# Patient Record
Sex: Male | Born: 1935
Health system: Southern US, Community
[De-identification: ages and names within clinical notes are randomized; demographics above are authoritative.]

## PROBLEM LIST (undated history)

## (undated) DIAGNOSIS — R011 Cardiac murmur, unspecified: Secondary | ICD-10-CM

## (undated) DIAGNOSIS — I2510 Atherosclerotic heart disease of native coronary artery without angina pectoris: Secondary | ICD-10-CM

## (undated) DIAGNOSIS — I10 Essential (primary) hypertension: Secondary | ICD-10-CM

## (undated) DIAGNOSIS — E7800 Pure hypercholesterolemia, unspecified: Secondary | ICD-10-CM

## (undated) DIAGNOSIS — I4891 Unspecified atrial fibrillation: Secondary | ICD-10-CM

## (undated) DIAGNOSIS — R39198 Other difficulties with micturition: Secondary | ICD-10-CM

## (undated) DIAGNOSIS — I480 Paroxysmal atrial fibrillation: Secondary | ICD-10-CM

## (undated) DIAGNOSIS — E039 Hypothyroidism, unspecified: Secondary | ICD-10-CM

## (undated) DIAGNOSIS — K5790 Diverticulosis of intestine, part unspecified, without perforation or abscess without bleeding: Secondary | ICD-10-CM

## (undated) DIAGNOSIS — G4700 Insomnia, unspecified: Secondary | ICD-10-CM

## (undated) HISTORY — PX: HERNIA REPAIR: SHX51

## (undated) HISTORY — DX: Paroxysmal atrial fibrillation: I48.0

## (undated) HISTORY — DX: Cardiac murmur, unspecified: R01.1

## (undated) HISTORY — DX: Atherosclerotic heart disease of native coronary artery without angina pectoris: I25.10

## (undated) HISTORY — PX: BYPASS GRAFT: SHX909

## (undated) HISTORY — DX: Unspecified atrial fibrillation: I48.91

## (undated) HISTORY — PX: HIP SURGERY: SHX245

## (undated) HISTORY — PX: HEMORRHOID SURGERY: SHX153

## (undated) HISTORY — DX: Diverticulosis of intestine, part unspecified, without perforation or abscess without bleeding: K57.90

## (undated) HISTORY — DX: Other difficulties with micturition: R39.198

## (undated) HISTORY — DX: Hypothyroidism, unspecified: E03.9

## (undated) HISTORY — DX: Insomnia, unspecified: G47.00

---

## 1999-05-12 ENCOUNTER — Encounter (INDEPENDENT_AMBULATORY_CARE_PROVIDER_SITE_OTHER): Payer: Self-pay

## 1999-05-12 ENCOUNTER — Ambulatory Visit (HOSPITAL_COMMUNITY): Admission: RE | Admit: 1999-05-12 | Discharge: 1999-05-12 | Payer: Self-pay | Admitting: Gastroenterology

## 1999-08-28 ENCOUNTER — Encounter: Admission: RE | Admit: 1999-08-28 | Discharge: 1999-08-28 | Payer: Self-pay | Admitting: *Deleted

## 1999-08-28 ENCOUNTER — Encounter: Payer: Self-pay | Admitting: *Deleted

## 1999-09-15 ENCOUNTER — Encounter: Admission: RE | Admit: 1999-09-15 | Discharge: 1999-10-28 | Payer: Self-pay | Admitting: Cardiology

## 2000-02-28 ENCOUNTER — Inpatient Hospital Stay (HOSPITAL_COMMUNITY): Admission: EM | Admit: 2000-02-28 | Discharge: 2000-02-29 | Payer: Self-pay | Admitting: Emergency Medicine

## 2001-09-12 ENCOUNTER — Encounter: Payer: Self-pay | Admitting: General Surgery

## 2001-09-15 ENCOUNTER — Encounter (INDEPENDENT_AMBULATORY_CARE_PROVIDER_SITE_OTHER): Payer: Self-pay | Admitting: Specialist

## 2001-09-15 ENCOUNTER — Ambulatory Visit (HOSPITAL_COMMUNITY): Admission: RE | Admit: 2001-09-15 | Discharge: 2001-09-15 | Payer: Self-pay | Admitting: General Surgery

## 2002-07-23 ENCOUNTER — Encounter: Payer: Self-pay | Admitting: Internal Medicine

## 2002-07-23 ENCOUNTER — Inpatient Hospital Stay (HOSPITAL_COMMUNITY): Admission: EM | Admit: 2002-07-23 | Discharge: 2002-07-26 | Payer: Self-pay | Admitting: Emergency Medicine

## 2002-07-24 ENCOUNTER — Encounter (INDEPENDENT_AMBULATORY_CARE_PROVIDER_SITE_OTHER): Payer: Self-pay | Admitting: Interventional Cardiology

## 2002-08-06 ENCOUNTER — Ambulatory Visit (HOSPITAL_COMMUNITY): Admission: RE | Admit: 2002-08-06 | Discharge: 2002-08-06 | Payer: Self-pay | Admitting: Internal Medicine

## 2002-08-06 ENCOUNTER — Encounter: Payer: Self-pay | Admitting: Internal Medicine

## 2002-08-14 ENCOUNTER — Ambulatory Visit (HOSPITAL_COMMUNITY): Admission: RE | Admit: 2002-08-14 | Discharge: 2002-08-14 | Payer: Self-pay | Admitting: Internal Medicine

## 2002-08-14 ENCOUNTER — Encounter: Payer: Self-pay | Admitting: Internal Medicine

## 2002-09-13 ENCOUNTER — Ambulatory Visit (HOSPITAL_COMMUNITY): Admission: RE | Admit: 2002-09-13 | Discharge: 2002-09-13 | Payer: Self-pay | Admitting: Interventional Cardiology

## 2003-03-27 ENCOUNTER — Emergency Department (HOSPITAL_COMMUNITY): Admission: EM | Admit: 2003-03-27 | Discharge: 2003-03-28 | Payer: Self-pay | Admitting: Emergency Medicine

## 2003-03-28 ENCOUNTER — Encounter: Payer: Self-pay | Admitting: Emergency Medicine

## 2004-07-15 ENCOUNTER — Ambulatory Visit (HOSPITAL_COMMUNITY): Admission: RE | Admit: 2004-07-15 | Discharge: 2004-07-15 | Payer: Self-pay | Admitting: Gastroenterology

## 2007-03-01 ENCOUNTER — Inpatient Hospital Stay (HOSPITAL_COMMUNITY): Admission: RE | Admit: 2007-03-01 | Discharge: 2007-03-04 | Payer: Self-pay | Admitting: Orthopedic Surgery

## 2008-04-30 ENCOUNTER — Encounter: Admission: RE | Admit: 2008-04-30 | Discharge: 2008-05-23 | Payer: Self-pay | Admitting: Orthopedic Surgery

## 2010-06-03 ENCOUNTER — Ambulatory Visit
Admission: RE | Admit: 2010-06-03 | Discharge: 2010-06-03 | Payer: Self-pay | Source: Home / Self Care | Attending: Specialist | Admitting: Specialist

## 2010-08-24 LAB — POCT I-STAT 4, (NA,K, GLUC, HGB,HCT): Potassium: 4.1 mEq/L (ref 3.5–5.1)

## 2010-10-20 ENCOUNTER — Other Ambulatory Visit: Payer: Self-pay | Admitting: Dermatology

## 2010-10-27 NOTE — Op Note (Signed)
NAMEKENNON, ENCINAS                ACCOUNT NO.:  [id number]   MEDICAL RECORD NO.:  [id number]          PATIENT TYPE:  INP   LOCATION:  0002                         FACILITY:  [facility]   PHYSICIAN:  Ollen Gross, M.D.    DATE OF BIRTH:  1920-11-19   DATE OF PROCEDURE:  03/01/2007  DATE OF DISCHARGE:                               OPERATIVE REPORT   PREOPERATIVE DIAGNOSIS:  Osteoarthritis of the right hip.   POSTOPERATIVE DIAGNOSIS:  Osteoarthritis of the right hip.   PROCEDURE:  Right total hip arthroplasty.   SURGEON:  Ollen Gross, M.D.   ASSISTANT:  Alexzandrew L. Perkins, PA-C   ANESTHESIA:  Spinal.   ESTIMATED BLOOD LOSS:  250.   DRAINS:  None.   COMPLICATIONS:  None.   CONDITION:  Stable to recovery.   CLINICAL NOTE:  Mr. Cygan is an 77 year old male with severe end-stage  arthritis of the right hip, progressively worsening pain and  dysfunction.  He presents for a right total hip arthroplasty.   PROCEDURE IN DETAIL:  After successful initiation of general anesthetic,  the patient is placed in the left lateral decubitus position with the  right side up and held with the hip positioner.  The right lower  extremity is isolated from the perineum with plastic drapes and prepped  and draped in the usual sterile fashion.  A short posterolateral  incision [place] made with a 10 blade through subcutaneous tissue to the  level of fascia lata, which is incised in line with the skin incision.  The sciatic nerve is palpated and protected and the short rotators  isolated off the femur.  Capsulectomy is performed and the hip is  dislocated.  The center of the femoral head is marked and the trial  prosthesis placed such that the center of the trial head corresponds to  the center of his native femoral head.  Osteotomy lines are marked on  the femoral neck and osteotomy made with an oscillating saw.  The  femoral head is then removed and the femur retracted anteriorly to gain  acetabular exposure.   Acetabular labrum and osteophytes are removed.  Reaming starts at 45 mm,  coursing in increments of two to 55 mm.  There is a central acetabular  cyst, which I removed and then packed with cancellous reaming.  We then  placed at 56-mm Pinnacle acetabular shell in anatomic position and  transfixed it with two domed screws.  The trial 36-mm neutral +4 liner  is placed.   The femur is prepared with the canal finder and irrigation.  Axial  reaming is performed to 15.5 mm proximal, reaming to a 20-D and the  sleeve machined to a large.  A 20-D large trial sleeve is placed with a  20 x 15 stem, 36 +8 neck, matching native anteversion.  A 36 +0 trial  head is placed.  With the +0 we did not have the soft tissue I desired  so I went to a 36 +3, which led to better soft tissue tension.  There  was excellent stability with full extension, full external rotation,  70  degrees flexion, 40 degrees adduction, 90 degrees internal rotation and  90 degrees of flexion in 70 degrees of internal rotation.  By placing  the right leg on top of the left, it felt as though the leg lengths were  equal.  The hip was then dislocated.  Trials were removed.  The  permanent apex hole eliminator and permanent 36 mm neutral +4 liner was  placed to the acetabular shell.  On the femoral side I placed a 20-D  large sleeve with 20 x 15 stem and a 36 +8 neck, matching native  anteversion.  A 36 +3 head is placed and the hip is reduced wit the same  stability parameters.  The wound is copiously with irrigated saline  solution and short rotators are reattached to the femur through drill  holes.  The fascia lata was closed with interrupted #1 Vicryl, subcu  closed with #1 and #2-0 Vicryl and subcuticular running 4-0 Monocryl.  The Incision is cleaned and dried and Steri-Strips and a bulky sterile  dressing applied.  He was then placed into a knee immobilizer, awakened  and transferred to recovery in  stable condition.      Ollen Gross, M.D.  Electronically Signed     FA/MEDQ  D:  03/01/2007  T:  03/01/2007  Job:  [id number]

## 2010-10-27 NOTE — H&P (Signed)
Billy Barnes, Billy Barnes                ACCOUNT NO.:  [id number]   MEDICAL RECORD NO.:  [id number]          PATIENT TYPE:  INP   LOCATION:  0002                         FACILITY:  [facility]   PHYSICIAN:  Ollen Gross, M.D.    DATE OF BIRTH:  31-Aug-1920   DATE OF ADMISSION:  03/01/2007  DATE OF DISCHARGE:                              HISTORY & PHYSICAL   CHIEF COMPLAINT:  Right hip pain.   HISTORY OF PRESENT ILLNESS:  The patient is an 77 year old male who has  been seen by Dr. Lequita Halt for ongoing right hip pain.  He has known  significant arthritis.  He has had progressive problems for quite some  time now and has reached the point where he would benefit from  undergoing surgical intervention.  Risks and benefits have been  discussed for total hip.  The patient would like to proceed with  surgery.   ALLERGIES:  Certain ANTIBIOTICS years ago.  Does not recall the name.  Food allergies:  SHELLFISH.   CURRENT MEDICATIONS:  Lipitor, amiodarone, levothyroxine, aspirin,  Mucinex, Centrum Silver vitamin, vitamin E, fish oil, vitamin A, vitamin  D, calcium, and saline nasal spray.   PAST MEDICAL HISTORY:  1. Mild chronic seasonal allergies.  2. Hypertension.  3. Coronary arterial disease.  4. Cardiac murmur.  5. History of paroxysmal atrial fibrillation.  6. Diverticulosis.  7. Hypothyroidism.   PAST SURGICAL HISTORY:  1. Coronary artery bypass grafting 1992.  2. Bilateral inguinal hernia repairs.  3. Hemorrhoid surgery.  4. He has had several colonoscopies.  5. Right cataract surgery.  6. Cardioversion for atrial fibrillation.   SOCIAL HISTORY:  Married, professor. One-pack-per-day smoker, quit in  1965.  One cocktail daily.  Two children.   FAMILY HISTORY:  Father:  Colon cancer.  Mother:  Colon cancer.   REVIEW OF SYSTEMS:  GENERAL:  No fevers, chills, night sweats.  NEUROLOGIC:  No seizures, syncope, or paralysis.  RESPIRATORY:  Does  have some chronic seasonal allergies.  No  shortness of breath,  productive cough, or hemoptysis.  CARDIOVASCULAR:  Past history of  angina prior to his CABG.  Denies any chest pain, angina, or orthopnea  right now.  GI:  No nausea or constipation.  GU:  No dysuria, hematuria,  or discharge.  MUSCULOSKELETAL:  Right hip.   PHYSICAL EXAMINATION:  VITAL SIGNS:  Pulse 52, respirations 12, blood  pressure 160/58.  GENERAL:  An 77 year old white male, well-nourished, well-developed, no  acute distress.  Alert, oriented, and cooperative, pleasant.  Average  historian.  He is accompanied by his wife.  HEENT: Normocephalic, atraumatic.  Pupils round and reactive.  Oropharynx clear.  EOMs intact.  Glasses. Also has bilateral hearing  aids.  NECK:  Supple.  CHEST:  Clear. Anterior posterior chest walls normal.  No rhonchi, rales  or wheezing.  HEART:  Bradycardic rhythm. Systolic ejection murmur grade 2-3/6 best  heard over aortic point but also along the left sternal border. Pulmonic  and Erb point crescendo -decrescendo type murmur.  ABDOMEN:  Soft, nontender.  Bowel sounds present.  RECTAL, BREASTS, GENITALIA:  Not  done, not pertinent to present illness.  EXTREMITIES:  Right hip:  Right hip shows flexion of 90 degrees, zero  internal rotation, 10 degrees external rotation, 20 degrees abduction.   IMPRESSION:  1. Osteoarthritis, right hip.  2. Mild chronic seasonal allergies.  3. Hypertension.  4. Coronary arterial disease status post coronary artery bypass      grafting.  5. Cardiac murmur.  6. History of paroxysmal atrial fibrillation status post      cardioversion.  7. Diverticulosis.  8. Hypothyroidism.   PLAN:  The patient will be admitted to [facility] to undergo  right total hip replacement arthroplasty.  Surgery will be performed by  Dr. Ollen Gross. The patient had been seen preoperatively by Dr. Garnette Scheuermann. His office visit was pending at the time of his History and  Physical.      Billy L.  Barnes, P.A.C.      Ollen Gross, M.D.  Electronically Signed    ALP/MEDQ  D:  02/28/2007  T:  03/01/2007  Job:  [id number]   cc:   Lyn Records, M.D.  Fax: [phone]   Mark A. Perini, M.D.  Fax: [phone]   Bernette Redbird, M.D.  Fax: [phone]

## 2010-10-30 NOTE — Discharge Summary (Signed)
NAME:  TERALD, JUMP                          ACCOUNT NO.:  [id number]   MEDICAL RECORD NO.:  [id number]                   PATIENT TYPE:  INP   LOCATION:  4711                                 FACILITY:  [facility]   PHYSICIAN:  Mark A. Perini, M.D.                DATE OF BIRTH:  10/08/1920   DATE OF ADMISSION:  07/23/2002  DATE OF DISCHARGE:  07/26/2002                                 DISCHARGE SUMMARY   DISCHARGE DIAGNOSES:  1. New onset atrial flutter with variable ventricular response.  2. Atherosclerotic coronary artery disease with past history of coronary     artery bypass grafting stable, at this time.  3. Hypertension.  4. Hyperlipidemia.  5. Perennial allergic rhinitis.  6. Past history of lower gastrointestinal bleeding thought due to     diverticulosis in September of 2001.   DISCHARGE MEDICATIONS:  1. Maxzide 37.5/25, one-half tablet once a day.  2. Amiodarone 200 mg one tablet daily.  3. Coumadin 5 mg as directed.  4. Enteric coated aspirin 81 mg daily.  (Due to the patient's history of     possible lower gastrointestinal bleeding, once he is therapeutic on     Coumadin his aspirin will be discontinued.)  5. Cardizem CD 120 mg once daily.  6. Lipitor 10 mg each evening.  7. Calcium 1000 mg once daily.  8. Patient is to stop his chlorthalidone/Hygroton  9. Due to his history of possible past lower gastrointestinal bleeding, once     he is therapeutic on Coumadin, his aspirin will be discontinued.   PROCEDURES:  1. Pulmonary function testing which showed that small airways obstruction     may be present.  There was a mild decrease in diffusion.  The patient's     FEV1 was 2.73 liters, FEV1/FVC was 69%.  2. 2-dimensional echocardiogram done on July 24, 2002 showed overall     left ventricular systolic function to be normal.  Ejection fraction was     55 to 65%.  There was no evidence of regional wall motion abnormalities     in the left ventricle.  The aortic  valve was mildly to moderately     calcified.  There was trivial aortic valve regurgitation.  The mean     transaortic valve gradient was 4.2 mmHg.  There was mild mitral annular     calcification.  The left atrium was mildly to moderately dilated.  There     was trivial pericardial effusion at the apex.   HISTORY OF PRESENT ILLNESS:  The patient is a pleasant 77 year old gentleman  with a history of coronary artery disease with bypass grafting in 1992,  hypertension and hyperlipidemia.  He has overall been in excellent health,  however, and is very functional.  He presented to the office for a routine  physical examination on July 23, 2002.  He reported feeling somewhat  fatigued for the  prior two months.  He also noticed that for the last three  to four days when he would check his pulse and blood pressure at home his  heart rate would seem irregular.  He also noted some fluctuations in blood  pressure which were unusual for him.  He denied any chest pain, palpitations  or new edema.  Furthermore the patient denied any new congestive heart  failure symptoms.  During the physical examination he was found to be new  onset atrial flutter with variable conduction.  He was therefore admitted  for further management.   HOSPITAL COURSE:  The patient was admitted to a telemetry bed.  He was ruled  out for myocardial infarction by serial cardiac enzymes and  electrocardiograms.  He did have a chest x-ray which showed a possible  finding of scar versus a nodule.  This will require further follow up as an  outpatient.  Cardiology was consulted.  He was placed on low dose amiodarone  for a slow loading of this.  He was placed on heparin and then started on  Coumadin.  The patient did require some potassium supplementation and due to  the new initiation of amiodarone he was switched from chlorthalidone to a  potassium-sparing diuretic in the form of Maxzide.  The patient remained  stable during  his hospital course and had no new problems develop during his  stay.  He had the echocardiogram and pulmonary function tests with the  results as noted above.  On July 26, 2002 he was deemed stable for  discharge home for continued outpatient follow up.   PHYSICAL EXAMINATION:  VITAL SIGNS:  On discharge the blood pressure is  97.5, blood pressure 118/62, respirations 18, 95% oxygen saturation on room  air.  GENERAL:  The patient is in no acute distress.  LUNGS:  Lungs are clear to auscultation bilaterally with no wheezes, rales  or rhonchi.  HEART:  Heart was irregularly irregular with no murmur detected.  ABDOMEN:  Soft, nontender.  EXTREMITIES:  There is no peripheral edema.  NEUROLOGICAL: Patient was alert and oriented and neurologically intact.   LABORATORY DATA:  On discharge the white blood cell count was 7.6,  hemoglobin was 15.1, platelet count 189,000.  INR was 1.2.  Sodium 129,  potassium 3.8, chloride 98, cO2 24, creatinine was 1.4, glucose 102, calcium  8.6.  Liver function tests on admission were within normal limits.  BUN was  21 on discharge.  TSH was normal at 3.46, micro international units/mL.   DISCHARGE INSTRUCTIONS:   DIET:  The patient is to follow a low sodium diet with no added salt.   FOLLOW UP:  The patient is to follow up at [facility] in the Coumadin  clinic to continue to manage his INR.  He is to follow up on Thursday,  August 02, 2002 to see Dr. Katrinka Blazing to plan any further course of action.  He  will follow up in three to four weeks with Dr. Rodrigo Ran.  Furthermore,  our office will set up a follow up chest x-ray to follow up on the possible  lung finding on his x-ray during this admission.                                               Mark A. Waynard Edwards, M.D.    MAP/MEDQ  D:  08/01/2002  T:  08/01/2002  Job:  [id number]   cc:   Lyn Records III, M.D.  [address]  [address]  [place]  [place] [address]  Fax: [phone]

## 2010-10-30 NOTE — Consult Note (Signed)
NAME:  Billy Barnes, Billy Barnes NO.:  [id number]   MEDICAL RECORD NO.:  [id number]                   PATIENT TYPE:  INP   LOCATION:  4711                                 FACILITY:  [facility]   PHYSICIAN:  Lesleigh Noe, M.D.            DATE OF BIRTH:  Oct 03, 1920   DATE OF CONSULTATION:  07/23/2002  DATE OF DISCHARGE:                                   CONSULTATION   REASON FOR CONSULTATION:  1. Atrial flutter with rapid ventricular response, duration unknown.  2. Coronary atherosclerotic heart disease.     A. Coronary artery bypass grafting in 1992.  3. Hypertension.  4. Hypercholesteremia.  5. Diverticular gastrointestinal bleed from the colon in September 2001.  6. History of peptic ulcer disease.   RECOMMENDATIONS:  1. 2D Doppler echocardiogram.  2. Intravenous Cardizem for rate control and then convert to p.o.  3. Start Coumadin therapy.  4. Intravenous heparin.  5. Initiate low dose amiodarone therapy with hopes that when steady state is     reached, pharmacologic conversion will occur in 3 to 4 weeks, and if not,     electrical cardioversion will then be pursued.   COMMENTS:  The patient is 68 and has a history of coronary artery disease  and is status post coronary artery bypass graft surgery in 1992. In 2003  because of various complaints he had 2 exercise treadmill tests, each of  which were negative for any evidence of ischemia. He is admitted on this  occasion after finding atrial flutter with rapid ventricular response during  his complete examination with Dr. Waynard Edwards. In speaking with the patient,  perhaps there has been recent lethargy, although there are no significant  symptoms to date the onset of this arrhythmia.   MEDICATIONS:  1. Altace.  2. Lipitor.  3. Chlorthalidone.  4. Aspirin.  5. Viagra p.r.n.   ALLERGIES:  IODINE.   FAMILY HISTORY:  Noncontributory.   PAST MEDICAL HISTORY:  Father died at 48 of colon cancer. Mother  died of  cancer. He has an older brother in good health. A younger brother has  amyotrophic lateral sclerosis.   HABITS:  Does not smoke. In the remote past toyed with cigarettes. May have  46 ounces of wine per day.   PHYSICAL EXAMINATION:  GENERAL:  The patient is no acute distress.  VITAL SIGNS:  His blood pressure is 150/90, heart rate 100 to 125 with  atrial flutter noted on monitor and EKG.  SKIN:  Clear, no cyanosis is noted.  HEENT:  No asthma, sinuses noted.  NECK:  There is questionable soft carotid bruit.  CHEST:  Clear.  CARDIAC:  Irregular rhythm, no murmurs, rubs, clicks.  ABDOMEN:  Soft, bowel sounds normal, no bruits are heard.  EXTREMITIES:  Reveals no edema. Femoral pulses are 2+ bilaterally. Posterior  tibial pulses are 1 to 2+.  NEUROLOGIC:  No focal motor or  sensory deficits.   LABORATORY DATA:  An EKG reveals atrial flutter with variable block. Chest x-  ray possibly left upper lobe infiltrate.   Potassium 2.9, creatinine 1.2, sodium 129. Normal PT, INR 1.0. CBC:  Hemoglobin 15.8, platelet count 189. Troponin I less than  0.01, CK-MB  100/2.4.                                               Lesleigh Noe, M.D.    HWS/MEDQ  D:  07/24/2002  T:  07/24/2002  Job:  [id number]   cc:   Loraine Leriche A. Waynard Edwards, M.D.  [address]  [place]  [place] [address]  Fax: [phone]

## 2010-10-30 NOTE — Discharge Summary (Signed)
[facility]  Patient:    Billy Barnes, Billy Barnes                       MRN: [id number] Adm. Date:  [id number] Disc. Date: [id number] Attending:  Rich Brave CC:         Florencia Reasons, M.D.  Rodrigo Ran, M.D.   Discharge Summary  DISCHARGE DIAGNOSES: 1. Diverticular hemorrhage. 2. Post hemorrhagic anemia. 3. Hyperlipidemia. 4. Hypertension.  HISTORY OF PRESENT ILLNESS:  Please see admission note from yesterday.  The patient is a 77 year old male who had acute onset of rectal bleeding and syncope.  He presented to the emergency room where orthostatic changes responded to fluids and his hemoglobin dropped from 12.5 to 10.6.  Admission examination was notable for conjunctival pallor, clear chest, normal heart sounds and benign abdominal examination.  LABORATORY TESTS:  Prothrombin time 13.9.  Hemoglobin 12.5 dropped to 10.6 and stabilized and was 10.1 at discharge.  Electrolytes revealed hyponatremia with a sodium of 121 that responded to fluid restriction and was 133 on discharge.  Electrocardiogram revealed sinus bradycardia with a nonspecific intraventricular block and a possible old inferior wall MI.  HOSPITAL COURSE:  The patient was admitted to the ICU and monitored.  He was prepped with GoLYTELY for colonoscopy which was accomplished on the morning of February 28, 2000, and was negative with the exception of pan diverticulosis, worse on the left side.  There was no active bleeding throughout the colon. The patient was followed in the hospital for one additional day tolerating a full diet with no further bleeding and a stable hemoglobin.  DISPOSITION/FOLLOW UP:  He was discharged home and follow-up care will be with either [facility] or his primary doctor, Rodrigo Ran, M.D.  DISCHARGE MEDICATIONS: 1. Altace 2.5 mg per day. 2. Zocor 20 mg per day. 3. Chlorthalidone daily. 4. Multivitamins with iron daily for one  month. 5. He will not restart his baby aspirin until next week.  DISCHARGE INSTRUCTIONS:  He is to call at the first sign of any rectal bleeding.  DISCHARGE CONDITION:  Improved and stable. DD:  02/29/00 TD:  03/01/00 Job: [id number]

## 2010-10-30 NOTE — Op Note (Signed)
   NAME:  Billy Barnes, Billy Barnes NO.:  [id number]   MEDICAL RECORD NO.:  [id number]                   PATIENT TYPE:  OIB   LOCATION:  2899                                 FACILITY:  [facility]   PHYSICIAN:  Lesleigh Noe, M.D.            DATE OF BIRTH:  Feb 26, 1921   DATE OF PROCEDURE:  DATE OF DISCHARGE:  09/13/2002                                 OPERATIVE REPORT   REASON FOR PROCEDURE:  Atrial flutter.   PROCEDURE PERFORMED:  Elective electrical cardioversion.   DESCRIPTION:  After informed consent, 150 mg of IV sodium Pentothal was  administered by Dr. Kaylyn Layer. [place] and Conni Slipper, nurse anesthetist.  Once he was asleep, the patient was converted to normal sinus rhythm with a  single biphasic shock of 200 joules using an anterior and posterior paddle  location.  No complications occurred.   CONCLUSION:  Successful conversion to normal sinus rhythm.   PLAN:  Follow up in one week.  Continue medications as listed.  Consider  weaning and discontinuing Cardizem.                                               Lesleigh Noe, M.D.    HWS/MEDQ  D:  09/13/2002  T:  09/14/2002  Job:  [id number]   cc:   Loraine Leriche A. Waynard Edwards, M.D.  [address]  [place]  [place] [address]  Fax: [phone]   Kaylyn Layer. Michelle Piper, M.D.  [address]  [place]  [place] [address]  Fax: [phone]

## 2010-10-30 NOTE — Op Note (Signed)
[facility]  Patient:    Billy Barnes, Billy Barnes Visit Number: [id number] MRN: [id number]          Service Type: DSU Location: DAY Attending Physician:  Carson Myrtle Dictated by:   Sheppard Plumber Earlene Plater, M.D. Proc. Date: 09/15/01 Admit Date:  09/15/2001   CC:         Rodrigo Ran, M.D.   Operative Report  PREOPERATIVE DIAGNOSES:  Fourth degree internal and external hemorrhoids.  POSTOPERATIVE DIAGNOSES:  Fourth degree internal and external hemorrhoids.  PROCEDURE:  Extensive hemorrhoidectomy.  SURGEON:  Timothy E. Earlene Plater, M.D.  ANESTHESIA:  General.  INDICATIONS FOR PROCEDURE:  [name] is 75 but very healthy and active. He has had hemorrhoids for many years with prolapse and now because of the mucous discharge, bleeding and care management, he wishes to have a surgical hemorrhoidectomy and has been carefully discussed.  He was prepared at home, evaluated by anesthesia.  DESCRIPTION OF PROCEDURE:  Taken to the operating room, placed supine, LMA anesthesia provided. He was placed in lithotomy, perianal area inspected, prepped and draped in the usual fashion with Hibiclens soap. The hemorrhoids were massive in the right anterior, right posterior, and left lateral positions. Large external components as well. The anus was injected around and about with Marcaine 0.5% with epinephrine mixed 9:1 with Wydase. This was massaged in well. Then each of the three hemorrhoids were removed in an elliptical radial fashion about the anus including undermining of the edges, removed with the superficial varicosities and each wound was closed with running 2-0 chromic suture including the external skin. This was a nice closure, very adequate opening remaining at the completion of the procedure. The sphincters were intact and there was no bleeding. Gelfoam gauze and a dry sterile dressing applied. He tolerated it well and was removed to the recovery room in good  condition.  Written and verbal instructions were given to him and his wife including Percocet and he will be seen and followed as an outpatient. Dictated by:   Sheppard Plumber Earlene Plater, M.D. Attending Physician:  Carson Myrtle DD:  09/15/01 TD:  09/15/01 Job: [id number]

## 2010-10-30 NOTE — Op Note (Signed)
NAMESAMIN, MILKE                ACCOUNT NO.:  [id number]   MEDICAL RECORD NO.:  [id number]          PATIENT TYPE:  AMB   LOCATION:  ENDO                         FACILITY:  [facility]   PHYSICIAN:  Bernette Redbird, M.D.   DATE OF BIRTH:  24-Aug-1920   DATE OF PROCEDURE:  07/15/2004  DATE OF DISCHARGE:                                 OPERATIVE REPORT   PROCEDURE:  Colonoscopy.   INDICATION:  An 77 year old with family history of colon cancer in both  parents. Colonoscopy about 5 years ago, at the time of an apparent  diverticular hemorrhage; it was negative for polyps.   FINDINGS:  Scattered diverticulosis.   PROCEDURE:  The nature, purpose, and risks of procedure were familiar to the  patient from prior examination; and he provided written consent. Sedation  was Fentanyl 100 mcg and Versed 9 mg IV prior to and during the course of  the procedure without arrhythmias or desaturation, apart from maintenance of  his baseline bradycardia around 50 down to the mid-40s.   Digital exam of the prostate showed it to be slightly enlarged and fairly  firm, but without any discrete notch.   The Olympus adult adjustable-tension video colonoscope was advanced with  mild-to-moderate difficulty due to loosening, turning the patient into the  supine position, and applying external abdominal compression. We entered the  terminal ileum for a short distance and it appeared normal; pullback was  then performed. The quality of prep was very good; and it is felt that all  areas were well seen.   The patient had mild right-sided diverticulosis and moderate sigmoid  diverticulosis. No polyps, cancer, colitis or vascular malformations were  observed. Retroflexion in the rectum and re inspection of the rectum were  unremarkable. No biopsies were obtained. The patient tolerated the procedure  well; and there no apparent complications.   IMPRESSION:  1.  Family history of colon cancer, without worrisome  findings on current      examination (V16.0)  2.  Moderate diverticulosis.   PLAN:  In view of the patient's advanced age, and the fact that he is had no  adenomas on 2 colonoscopies 5 years apart, and taking into account medical  comorbidities including a remote history of coronary bypass operation, and  since the patient would not be due for a repeat examination until age 71, I  think, that a good case can be made that, despite his strong family history  of colon cancer, further surveillance routine and colonoscopic exams are not  necessary in this individual.   Accordingly, I will not plan to put him on our follow-up list, but of the  course if specific clinical problems, such as anemia, heme-positivity, or  bleeding were to occur; consideration might be given to diagnostic  colonoscopy in those circumstances.      RB/MEDQ  D:  07/15/2004  T:  07/15/2004  Job:  [id number]   cc:   Loraine Leriche A. Waynard Edwards, M.D.  [address]  [place]  [place] [address]  Fax: [phone]

## 2010-10-30 NOTE — Discharge Summary (Signed)
NAMEKYE, HEDDEN                ACCOUNT NO.:  [id number]   MEDICAL RECORD NO.:  [id number]          PATIENT TYPE:  INP   LOCATION:  1620                         FACILITY[facility]   PHYSICIAN:  Ollen Gross, M.D.    DATE OF BIRTH:  01/28/1921   DATE OF ADMISSION:  03/01/2007  DATE OF DISCHARGE:  03/04/2007                               DISCHARGE SUMMARY   ADMITTING DIAGNOSES:  1. Osteoarthritis, right hip.  2. Mild chronic seasonal allergies.  3. Hypertension.  4. Coronary arterial disease, status post coronary artery bypass      grafting.  5. Cardiac murmur.  6. History of paroxysmal atrial fibrillation status post      cardioversion.  7. Diverticulosis.  8. Hypothyroidism.   DISCHARGE DIAGNOSES:  1. Osteoarthritis, right hip status post right total hip replacement      arthroplasty.  2. Post-op blood loss anemia did not require transfusion.  3. Post-op hyponatremia.  4. Mild chronic seasonal allergies.  5. Hypertension.  6. Coronary arterial disease, status post coronary artery bypass      grafting.  7. Cardiac murmur.  8. History of paroxysmal atrial fibrillation status post      cardioversion.  9. Diverticulosis.  10.Hypothyroidism.   PROCEDURE:  March 01, 2007, right total hip.   SURGEON:  Dr. Lequita Halt.   ASSISTANT:  Avel Peace PA-C.   ANESTHESIA:  Spinal anesthesia.   CONSULTS:  Dr. Rodrigo Ran.   BRIEF HISTORY:  Mr. Detweiler is a 77 year old male with severe end-stage  arthritis of the right hip, progressive worsening pain and dysfunction,  now presents for total hip arthroplasty.   LABORATORY DATA:  CBC on admission:  A hemoglobin of 14.7, hematocrit of  42.5, white cell count 5.6, post-op hemoglobin 11.6, drifted down to 11.  Last known H&H 10.6 and 30.6.  PT/PTT on admission 13.2 and 33  respectively.  INR 1.0.  Serial pro-times followed.  Last noted PT/INR  18.0 and 1.5.  Chem panel on admission:  Elevated potassium of 5.9,  elevated creatinine  1.5.  Sodium low at 132.  Remaining chem panel  within normal limits.  Serial BMETs were followed.  Sodium did drop down  to 130, then 129, last noted at 127. Potassium came down to a normal  level of 4.3.  Creatinine came down a level of 1.19.  Glucose went up  from 95 to 116, last noted at 124.  Serial chem panels were followed.  Total protein dropped from 6.4 to 4.7, albumin drop 3.5 to 2.2, AST went  up from 22 to high 39, back down to a normal of 31.  Remaining Chem  panel within normal limits.  Urine osmolality 516, vitamin D 38.  Preop  UA negative, blood group type B+.  Right hip films:  February 22, 2007  severe degenerative changes.  Portable hip films March 01, 2007,  right total hip arthroplasty without evidence of complication.  Portable  pelvis March 01, 2007, same right total hip arthroplasty without  complications.   HOSPITAL COURSE:  The patient was admitted to [facility],  tolerated  the procedure well, later transferred to recovery room on  orthopedic floor.  She was started on PCA and p.o. analgesic pain  control, following surgery.  Consults called, and Dr. Waynard Edwards for medical  issues.  The patient was noted to have post-op hyponatremia, felt to be  chronic in nature and was monitored closely.  He was started back on his  cardiac meds.  Blood pressure was stable, and he was rate controlled on  his amiodarone.  He was initially transferred to [place] floors on [address], and then transferred up to [address], once a bed was available.  He  started on PCA initially but discontinued that on day one, had excellent  urinary output.  Sodium was low, as noted preoperatively.  Started  getting up out of bed on day one.  By day two, he was doing a little bit  better.  Sodium was still low, but he was ambulating approximately 25  feet and later 65 feet.  Other labs were ordered per Dr. Waynard Edwards for the  assessment of hyponatremia, felt to be chronic in nature.  Serum  and  urine osmolality were checked, otherwise no complaints.  He progressed  with this therapy very well.  Dressing was changed on day two, and his  incision was healing well.  His hemoglobin was stable at 11.  He was  going to be placed on iron, if it dropped any further.  He was still  rate-controlled.  We discontinued his fluids, hep-locked his IV and took  his Foley out.  Continued to progress well with this therapy, and by the  following day he was doing well, no complaints, and was discharged home.   DISCHARGE/PLAN:  1. Patient  discharged home on March 04, 2007.  2. Discharge diagnoses, please see above.  3. Discharge meds:  Coumadin, Vicodin, Robaxin.   DIET:  Cardiac diet.   ACTIVITY:  Partial weightbearing 25% to 50%, total hip protocol, hip  precautions.   DISPOSITION:  Home.   CONDITION ON DISCHARGE:  Improving.      Alexzandrew L. Perkins, P.A.C.      Ollen Gross, M.D.  Electronically Signed    ALP/MEDQ  D:  03/30/2007  T:  03/31/2007  Job:  [id number]   cc:   Ollen Gross, M.D.  Fax: [phone]   Lyn Records, M.D.  Fax: [phone]   Mark A. Perini, M.D.  Fax: [phone]   Bernette Redbird, M.D.  Fax: [phone]

## 2010-10-30 NOTE — Procedures (Signed)
[name]  Patient:    Billy Barnes, Billy Barnes                       MRN: [id number] Proc. Date: 02/28/00 Adm. Date:  [id number] Disc. Date: [id number] Attending:  Rich Brave CC:         Florencia Reasons, M.D.  Dr. Darcus Austin   Procedure Report  PROCEDURE PERFORMED:  Video colonoscopy.  ENDOSCOPIST:  Roosvelt Harps, M.D.  INDICATIONS:   Lower gastrointestinal bleeding in 77 year old male.  His hemoglobin seems to have stabilized at 10.6 and he has had no further bleeding overnight while prepping for colonoscopy.  PREPARATION:  Colyte 4L p.o.  PREPROCEDURE SEDATION:   He received 40 mg of Demerol and 4 mg of Versed intravenously.  In addition, he was on 2L of nasal cannula O2.  DESCRIPTION OF PROCEDURE:  The Olympus video colonoscope was inserted via the rectum and advanced very all the way to the hepatic flexure.  At this point extra-abdominal pressure and rotating the patient onto his back had to be utilized in order to achieve the cecum.  Cecal landmarks were identified and photographed.  On withdrawal, the mucosa was carefully evaluated.  There was pandiverticulosis worse on the left side of the colon, but present all the way to the ascendign colon.  There was no blood or active bleeding anywhere in the colon.  No neoplasia or any inflammatory lesions were seen.  The patient tolerated the procedure very well.  Pulse, blood pressure and oximetry testing remained stable throughout.  He was observed in recovery for 45 minutes and discharged back to his hospital room.  IMPRESSION:  Status post diverticular bleed.  The patient seems fairly stable.  PLAN:  He will be returned to a regular medical bed and observed for 24 hours more.  Please see the orders. RECOMMENDATIONS: DD:  02/28/00 TD:  03/01/00 Job: [id number]

## 2010-10-30 NOTE — H&P (Signed)
NAME:  Billy Barnes, Billy Barnes                          ACCOUNT NO.:  [id number]   MEDICAL RECORD NO.:  [id number]                   PATIENT TYPE:  INP   LOCATION:  1832                                 FACILITY:  [facility]   PHYSICIAN:  Mark A. Perini, M.D.                DATE OF BIRTH:  August 20, 1920   DATE OF ADMISSION:  07/23/2002  DATE OF DISCHARGE:                                HISTORY & PHYSICAL   HISTORY OF PRESENT ILLNESS:  The patient is a pleasant 77 year old gentleman  with a history of atherosclerotic coronary disease status post coronary  artery bypass grafting in 1992, hypertension, hypercholesterolemia, remote  history of diverticular GI bleed, and perineal allergic rhinitis.  He  presented to the office earlier today for a routine annual physical exam.  He reported at that time that he has felt somewhat fatigued for the last few  months.  He felt that this was due to decreased physical exercise.  He has  also noticed, especially three days ago, that when he would check his pulse  and blood pressure with his home blood pressure machine that his heart rate  would be very irregular.  He also noticed some fluctuations in his blood  pressure.  He denies any chest pains, palpitations or new edema.  He also  denies any new congestive heart failure symptoms such as shortness of breath  or orthopnea.  The remainder of his review of systems was essentially  unremarkable.  He was found during his physical to have new onset of atrial  flutter with variable conduction.  He was therefore admitted for further  management of this problem.   PAST MEDICAL HISTORY:  1. Atherosclerotic coronary disease status post coronary artery bypass     grafting in 1992.  2. Hypertension.  3. History of bilateral hernia repairs.  4. History of subacute hepatitis in his 30's.  5. Hyperlipidemia.  6. Hyponatremia in 2001.  7. History of elevated random glucose measurement in August 2001.  8. Lower GI bleeding  in September 2001 thought due to diverticulosis.  9. History of hemorrhoids status post recent hemorrhoid repair.  10.      Perennial allergic rhinitis.  11.      Urticaria.  12.      He was told once in his 30's that one kidney was smaller than the     other.   ALLERGIES:  1. MYCINS which cause flushing.  2. SHELLFISH.  3. IODINE.  4. ALTACE which made him feel bad.   MEDICATIONS:  1. Lipitor 10 mg each evening.  2. Chlorthalidone 25 mg daily.  3. Baby aspirin daily.  4. Viagra occasionally.  5. Ginkgo biloba.  6. Vitamin C, E, A and D.  7. Calcium 1000 mg daily.  8. Saline nasal spray.  9. Triamcinolone cream to his skin occasionally as needed.   SOCIAL HISTORY:  He is married since  29.  He has two children, one  grandchild.  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  He has a remote mild tobacco use history.  He drinks four to  six ounces of wine per day.  No drug use history.   FAMILY HISTORY:  Father died at age 24 of colon cancer.  Mother died at age  46 of abdominal cancer.  He has one older brother who is healthy and one  younger brother who died of Truddie Hidden Gehrig's disease at age 58.  He has two  healthy children.   REVIEW OF SYSTEMS:  Last exercise treadmill test was in September 2003 and  was normal.  He denies any recent fevers, chills or night sweats.  He has  had no visual problems or hearing difficulties, although he has had some  progressive decline in hearing over the years.  Again he denies any chest  pains.  He denies any wheezing or shortness of breath.  He does occasionally  have a mild cough.  He has had normal bowel movements, no blood from above  or below.  He has no reflux symptoms.  He has one to two times nocturia,  otherwise urinary function is normal.  He has some right hip pain on and  off.  He has no new rashes or moles.  His mood is normally good.  He has a  problem with chronic postnasal drip.   PHYSICAL  EXAMINATION:  VITALS:  Weight 175, blood pressure 152/80, heart  rate 70 and irregular.  GENERAL:  He is no acute distress and looks well.  HEENT:  Within normal limits.  NECK:  There is no JVD, no bruits.  LUNGS:  Clear to auscultation bilaterally.  HEART:  Irregularly irregular with no murmur detected.  EXTREMITIES:  There are 2+ femoral pulses, 2+ pedal pulses.  There is edema  or varicosities.  ABDOMEN:  Benign with positive bowel sounds.  No masses or  hepatosplenomegaly.  RECTAL EXAM:  Reveals 2+ in size slightly irregular prostate with no hard  nodules.  The rectal outlet reveals a small hemorrhoid but much smaller than  last year.  LYMPH:  He has no lymphadenopathy.  SKIN:  Normal.  NEUROLOGICAL:  Grossly intact.  MUSCULOSKELETAL:  Normal.   ASSESSMENT/PLAN:  1. New-onset atrial flutter with variable ventricular response.  We will     admit and place on heparin drip and place on Coumadin.  We will obtain     cardiology consultation for further evaluation and treatment.  2. Atherosclerotic coronary disease, stable.  We will rule out for     myocardial infarction.  3. Hypertension.  We will likely add an angiotensin II receptor blocker for     further heart protection and to better     control blood pressure.  4. Hyperlipidemia.  Continue Lipitor.   Patient is a full code status.                                               Mark A. Waynard Edwards, M.D.    MAP/MEDQ  D:  07/23/2002  T:  07/23/2002  Job:  [id number]   cc:   Lesleigh Noe, M.D.  [address]  [address]  [place]  [place] [address]  Fax: [phone]

## 2010-11-03 ENCOUNTER — Emergency Department (HOSPITAL_COMMUNITY)
Admission: EM | Admit: 2010-11-03 | Discharge: 2010-11-04 | Disposition: A | Discharge status: Home / Self Care | Payer: Medicare Other | Attending: Emergency Medicine | Admitting: Emergency Medicine

## 2010-11-03 DIAGNOSIS — I10 Essential (primary) hypertension: Secondary | ICD-10-CM | POA: Insufficient documentation

## 2010-11-03 DIAGNOSIS — E7800 Pure hypercholesterolemia, unspecified: Secondary | ICD-10-CM | POA: Insufficient documentation

## 2010-11-03 DIAGNOSIS — S51809A Unspecified open wound of unspecified forearm, initial encounter: Secondary | ICD-10-CM | POA: Insufficient documentation

## 2010-11-03 DIAGNOSIS — IMO0001 Reserved for inherently not codable concepts without codable children: Secondary | ICD-10-CM | POA: Insufficient documentation

## 2010-11-10 NOTE — Consult Note (Signed)
Billy Barnes, Billy Barnes                ACCOUNT NO.:  [id number]  MEDICAL RECORD NO.:  [id number]           PATIENT TYPE:  E  LOCATION:  WLED                         FACILITY:  [facility]  PHYSICIAN:  Betha Loa, MD        DATE OF BIRTH:  06/03/1921  DATE OF CONSULTATION:  11/04/2010 DATE OF DISCHARGE:  11/04/2010                                CONSULTATION   CHIEF COMPLAINT:  Cat bite to right arm.  Consult is from [facility] Emergency Department.  HISTORY:  Mr. Billy Barnes is an 77 year old white male, who was trying to break up a fight between his own cats this evening, the evening of Nov 03, 2010 when he was bitten on the proximal forearm of the right upper extremity.  He came to [facility] Emergency Department for evaluation. The bite was approximately 3 hours ago.  He was evaluated and found to have bite wounds at the proximal right forearm.  He was given a dose of p.o. Augmentin.  He reports no fevers, chills or night sweats.  He says that the arm feels much better since he came and swelling is decreasing.  ALLERGIES:  No known drug allergies.  PAST MEDICAL HISTORY: 1. Hypertension. 2. Hypercholesterolemia. 3. Coronary artery disease.  PAST SURGICAL HISTORY: 1. Coronary artery bypass grafting. 2. Hernia repair. 3. Right total hip arthroplasty.  MEDICATIONS: 1. Lipitor. 2. Hydrochlorothiazide. 3. Aspirin. 4. Pravastatin. 5. Micardis  SOCIAL HISTORY:  Mr. Billy Barnes is retired.  He does not smoke and drinks alcohol occasionally.  REVIEW OF SYSTEMS:  Thirteen-point review of systems negative.  PHYSICAL EXAMINATION:  VITAL SIGNS:  Temperature 98.5, pulse 55, respirations 18, BP 182/79. HEAD:  Normocephalic, atraumatic. NECK:  Supple.  Full range of motion. EXTREMITIES:  Bilateral upper extremities are distally neurovascularly intact in radial, median and ulnar nerve distributions.  He has intact sensation, capillary refill in all fingertips.  He can flex, extend the IP joint  thumbs across his fingers.  Left upper extremity wounds are tender to palpation.  Right upper extremity has four puncture wounds at the proximal radial aspect of the forearm.  There is mild irritation, redness surrounding the two volar wounds and a mild amount of swelling around it.  There is no streaking.  There is no drainage.  The dorsal wounds have no erythema, no swelling.  He is mildly tender at the wound sites, but nowhere else.  ASSESSMENT AND PLAN:  Right forearm cat bite.  I discussed with Mr. Guyton and his wife the nature of cat bites.  We discussed that many of them will get infected, but with early antibiotic treatment it may not. We discussed potential for going to the operating room for irrigation and debridement of the wounds tonight versus continue on the antibiotics and watching closely for any signs of infection.  He would like to try the antibiotics first.  I think this is reasonable, but we will keep a very close eye on him.  He was given Augmentin in the Emergency Department and now we will see him tomorrow morning for further evaluation.  He will be n.p.o. after  5:00 a.m. in case anything looks like it needs to be irrigated and debrided tomorrow.  He agrees with the plan of care.     Betha Loa, MD     KK/MEDQ  D:  11/04/2010  T:  11/04/2010  Job:  [id number]  Electronically Signed by Betha Loa  on 11/10/2010 04:20:05 PM

## 2011-02-18 ENCOUNTER — Other Ambulatory Visit: Payer: Self-pay | Admitting: Dermatology

## 2011-03-25 LAB — RENAL FUNCTION PANEL
GFR calc Af Amer: >60
GFR calc non Af Amer: 58 — ABNORMAL LOW
Glucose, Bld: 112 — ABNORMAL HIGH
Phosphorus: 2.5
Potassium: 4.3
Sodium: 129 — ABNORMAL LOW

## 2011-03-25 LAB — BASIC METABOLIC PANEL
BUN: 13
CO2: 27
Calcium: 8 — ABNORMAL LOW
Chloride: 93 — ABNORMAL LOW
Chloride: 95 — ABNORMAL LOW
Chloride: 97
Creatinine, Ser: 1.19
Creatinine, Ser: 1.22
Creatinine, Ser: 1.26
GFR calc Af Amer: >60
GFR calc Af Amer: >60
GFR calc Af Amer: >60
GFR calc Af Amer: >60
GFR calc non Af Amer: 55 — ABNORMAL LOW
GFR calc non Af Amer: 57 — ABNORMAL LOW
GFR calc non Af Amer: 58 — ABNORMAL LOW
Potassium: 4.3
Potassium: 4.6
Potassium: 4.6
Sodium: 128 — ABNORMAL LOW
Sodium: 130 — ABNORMAL LOW

## 2011-03-25 LAB — DIFFERENTIAL
Lymphocytes Relative: 15
Lymphocytes Relative: 8 — ABNORMAL LOW
Lymphs Abs: 0.6 — ABNORMAL LOW
Lymphs Abs: 1.1
Monocytes Absolute: 0.8 — ABNORMAL HIGH
Monocytes Relative: 11
Monocytes Relative: 9
Neutro Abs: 5.2
Neutrophils Relative %: 70
Neutrophils Relative %: 78 — ABNORMAL HIGH

## 2011-03-25 LAB — CBC
HCT: 30.6 — ABNORMAL LOW
HCT: 33.5 — ABNORMAL LOW
Hemoglobin: 11.1 — ABNORMAL LOW
MCHC: 34.8
MCV: 97.5
MCV: 97.7
Platelets: 161
RBC: 3.14 — ABNORMAL LOW
RBC: 3.27 — ABNORMAL LOW
RBC: 3.43 — ABNORMAL LOW
WBC: 5.4
WBC: 7.3
WBC: 7.5

## 2011-03-25 LAB — VITAMIN D 25 HYDROXY (VIT D DEFICIENCY, FRACTURES): Vit D, 25-Hydroxy: 38 (ref 20–57)

## 2011-03-25 LAB — CROSSMATCH
ABO/RH(D): B POS
Antibody Screen: NEGATIVE

## 2011-03-25 LAB — PROTIME-INR
INR: 1.3
INR: 1.5
Prothrombin Time: 14.8
Prothrombin Time: 18 — ABNORMAL HIGH

## 2011-03-25 LAB — ABO/RH: ABO/RH(D): B POS

## 2011-03-25 LAB — HEPATIC FUNCTION PANEL
ALT: 16
AST: 39 — ABNORMAL HIGH
Albumin: 2.2 — ABNORMAL LOW
Albumin: 2.7 — ABNORMAL LOW
Bilirubin, Direct: 0.2
Total Bilirubin: 0.9
Total Protein: 4.7 — ABNORMAL LOW
Total Protein: 5.6 — ABNORMAL LOW

## 2011-03-25 LAB — CALCIUM, IONIZED: Calcium, Ion: 1.21

## 2011-03-25 LAB — OSMOLALITY, URINE: Osmolality, Ur: 516

## 2011-03-26 LAB — URINALYSIS, ROUTINE W REFLEX MICROSCOPIC
Bilirubin Urine: NEGATIVE
Nitrite: NEGATIVE
Specific Gravity, Urine: 1.013
Urobilinogen, UA: 0.2

## 2011-03-26 LAB — COMPREHENSIVE METABOLIC PANEL
ALT: 17
AST: 22
Albumin: 3.5
CO2: 30
Calcium: 9.4
GFR calc Af Amer: 52 — ABNORMAL LOW
Sodium: 132 — ABNORMAL LOW
Total Protein: 6.4

## 2011-03-26 LAB — CBC
MCHC: 34.5
Platelets: 202
RBC: 4.37

## 2011-06-22 DIAGNOSIS — L821 Other seborrheic keratosis: Secondary | ICD-10-CM | POA: Diagnosis not present

## 2011-06-22 DIAGNOSIS — L259 Unspecified contact dermatitis, unspecified cause: Secondary | ICD-10-CM | POA: Diagnosis not present

## 2011-06-22 DIAGNOSIS — L570 Actinic keratosis: Secondary | ICD-10-CM | POA: Diagnosis not present

## 2011-07-28 DIAGNOSIS — Z79899 Other long term (current) drug therapy: Secondary | ICD-10-CM | POA: Diagnosis not present

## 2011-10-19 DIAGNOSIS — M169 Osteoarthritis of hip, unspecified: Secondary | ICD-10-CM | POA: Diagnosis not present

## 2011-10-19 DIAGNOSIS — M5137 Other intervertebral disc degeneration, lumbosacral region: Secondary | ICD-10-CM | POA: Diagnosis not present

## 2011-11-11 DIAGNOSIS — I2510 Atherosclerotic heart disease of native coronary artery without angina pectoris: Secondary | ICD-10-CM | POA: Diagnosis not present

## 2011-11-11 DIAGNOSIS — N182 Chronic kidney disease, stage 2 (mild): Secondary | ICD-10-CM | POA: Diagnosis not present

## 2011-11-11 DIAGNOSIS — R7301 Impaired fasting glucose: Secondary | ICD-10-CM | POA: Diagnosis not present

## 2011-11-11 DIAGNOSIS — M549 Dorsalgia, unspecified: Secondary | ICD-10-CM | POA: Diagnosis not present

## 2011-11-11 DIAGNOSIS — Z79899 Other long term (current) drug therapy: Secondary | ICD-10-CM | POA: Diagnosis not present

## 2011-11-11 DIAGNOSIS — E039 Hypothyroidism, unspecified: Secondary | ICD-10-CM | POA: Diagnosis not present

## 2011-11-22 DIAGNOSIS — IMO0002 Reserved for concepts with insufficient information to code with codable children: Secondary | ICD-10-CM | POA: Diagnosis not present

## 2011-11-22 DIAGNOSIS — M999 Biomechanical lesion, unspecified: Secondary | ICD-10-CM | POA: Diagnosis not present

## 2011-11-22 DIAGNOSIS — M5137 Other intervertebral disc degeneration, lumbosacral region: Secondary | ICD-10-CM | POA: Diagnosis not present

## 2011-11-23 DIAGNOSIS — IMO0002 Reserved for concepts with insufficient information to code with codable children: Secondary | ICD-10-CM | POA: Diagnosis not present

## 2011-11-23 DIAGNOSIS — M5137 Other intervertebral disc degeneration, lumbosacral region: Secondary | ICD-10-CM | POA: Diagnosis not present

## 2011-11-23 DIAGNOSIS — M999 Biomechanical lesion, unspecified: Secondary | ICD-10-CM | POA: Diagnosis not present

## 2011-11-24 DIAGNOSIS — M5137 Other intervertebral disc degeneration, lumbosacral region: Secondary | ICD-10-CM | POA: Diagnosis not present

## 2011-11-24 DIAGNOSIS — M999 Biomechanical lesion, unspecified: Secondary | ICD-10-CM | POA: Diagnosis not present

## 2011-11-24 DIAGNOSIS — IMO0002 Reserved for concepts with insufficient information to code with codable children: Secondary | ICD-10-CM | POA: Diagnosis not present

## 2011-11-26 DIAGNOSIS — M999 Biomechanical lesion, unspecified: Secondary | ICD-10-CM | POA: Diagnosis not present

## 2011-11-26 DIAGNOSIS — M5137 Other intervertebral disc degeneration, lumbosacral region: Secondary | ICD-10-CM | POA: Diagnosis not present

## 2011-11-26 DIAGNOSIS — IMO0002 Reserved for concepts with insufficient information to code with codable children: Secondary | ICD-10-CM | POA: Diagnosis not present

## 2011-11-29 DIAGNOSIS — IMO0002 Reserved for concepts with insufficient information to code with codable children: Secondary | ICD-10-CM | POA: Diagnosis not present

## 2011-11-29 DIAGNOSIS — M999 Biomechanical lesion, unspecified: Secondary | ICD-10-CM | POA: Diagnosis not present

## 2011-11-29 DIAGNOSIS — M5137 Other intervertebral disc degeneration, lumbosacral region: Secondary | ICD-10-CM | POA: Diagnosis not present

## 2011-11-30 DIAGNOSIS — M999 Biomechanical lesion, unspecified: Secondary | ICD-10-CM | POA: Diagnosis not present

## 2011-11-30 DIAGNOSIS — M5137 Other intervertebral disc degeneration, lumbosacral region: Secondary | ICD-10-CM | POA: Diagnosis not present

## 2011-11-30 DIAGNOSIS — IMO0002 Reserved for concepts with insufficient information to code with codable children: Secondary | ICD-10-CM | POA: Diagnosis not present

## 2011-12-02 DIAGNOSIS — M999 Biomechanical lesion, unspecified: Secondary | ICD-10-CM | POA: Diagnosis not present

## 2011-12-02 DIAGNOSIS — IMO0002 Reserved for concepts with insufficient information to code with codable children: Secondary | ICD-10-CM | POA: Diagnosis not present

## 2011-12-02 DIAGNOSIS — M5137 Other intervertebral disc degeneration, lumbosacral region: Secondary | ICD-10-CM | POA: Diagnosis not present

## 2011-12-06 DIAGNOSIS — IMO0002 Reserved for concepts with insufficient information to code with codable children: Secondary | ICD-10-CM | POA: Diagnosis not present

## 2011-12-06 DIAGNOSIS — M999 Biomechanical lesion, unspecified: Secondary | ICD-10-CM | POA: Diagnosis not present

## 2011-12-06 DIAGNOSIS — M5137 Other intervertebral disc degeneration, lumbosacral region: Secondary | ICD-10-CM | POA: Diagnosis not present

## 2011-12-07 DIAGNOSIS — M5137 Other intervertebral disc degeneration, lumbosacral region: Secondary | ICD-10-CM | POA: Diagnosis not present

## 2011-12-07 DIAGNOSIS — M999 Biomechanical lesion, unspecified: Secondary | ICD-10-CM | POA: Diagnosis not present

## 2011-12-07 DIAGNOSIS — IMO0002 Reserved for concepts with insufficient information to code with codable children: Secondary | ICD-10-CM | POA: Diagnosis not present

## 2011-12-09 DIAGNOSIS — IMO0002 Reserved for concepts with insufficient information to code with codable children: Secondary | ICD-10-CM | POA: Diagnosis not present

## 2011-12-09 DIAGNOSIS — M999 Biomechanical lesion, unspecified: Secondary | ICD-10-CM | POA: Diagnosis not present

## 2011-12-09 DIAGNOSIS — M5137 Other intervertebral disc degeneration, lumbosacral region: Secondary | ICD-10-CM | POA: Diagnosis not present

## 2011-12-13 DIAGNOSIS — IMO0002 Reserved for concepts with insufficient information to code with codable children: Secondary | ICD-10-CM | POA: Diagnosis not present

## 2011-12-13 DIAGNOSIS — M999 Biomechanical lesion, unspecified: Secondary | ICD-10-CM | POA: Diagnosis not present

## 2011-12-13 DIAGNOSIS — M5137 Other intervertebral disc degeneration, lumbosacral region: Secondary | ICD-10-CM | POA: Diagnosis not present

## 2011-12-15 DIAGNOSIS — IMO0002 Reserved for concepts with insufficient information to code with codable children: Secondary | ICD-10-CM | POA: Diagnosis not present

## 2011-12-15 DIAGNOSIS — M999 Biomechanical lesion, unspecified: Secondary | ICD-10-CM | POA: Diagnosis not present

## 2011-12-15 DIAGNOSIS — M5137 Other intervertebral disc degeneration, lumbosacral region: Secondary | ICD-10-CM | POA: Diagnosis not present

## 2011-12-20 DIAGNOSIS — IMO0002 Reserved for concepts with insufficient information to code with codable children: Secondary | ICD-10-CM | POA: Diagnosis not present

## 2011-12-20 DIAGNOSIS — M999 Biomechanical lesion, unspecified: Secondary | ICD-10-CM | POA: Diagnosis not present

## 2011-12-20 DIAGNOSIS — M5137 Other intervertebral disc degeneration, lumbosacral region: Secondary | ICD-10-CM | POA: Diagnosis not present

## 2011-12-21 ENCOUNTER — Other Ambulatory Visit: Payer: Self-pay | Admitting: Dermatology

## 2011-12-21 DIAGNOSIS — L408 Other psoriasis: Secondary | ICD-10-CM | POA: Diagnosis not present

## 2011-12-21 DIAGNOSIS — L259 Unspecified contact dermatitis, unspecified cause: Secondary | ICD-10-CM | POA: Diagnosis not present

## 2011-12-21 DIAGNOSIS — L570 Actinic keratosis: Secondary | ICD-10-CM | POA: Diagnosis not present

## 2011-12-23 DIAGNOSIS — M5137 Other intervertebral disc degeneration, lumbosacral region: Secondary | ICD-10-CM | POA: Diagnosis not present

## 2011-12-23 DIAGNOSIS — M999 Biomechanical lesion, unspecified: Secondary | ICD-10-CM | POA: Diagnosis not present

## 2011-12-23 DIAGNOSIS — IMO0002 Reserved for concepts with insufficient information to code with codable children: Secondary | ICD-10-CM | POA: Diagnosis not present

## 2011-12-27 DIAGNOSIS — M5137 Other intervertebral disc degeneration, lumbosacral region: Secondary | ICD-10-CM | POA: Diagnosis not present

## 2011-12-27 DIAGNOSIS — M999 Biomechanical lesion, unspecified: Secondary | ICD-10-CM | POA: Diagnosis not present

## 2011-12-27 DIAGNOSIS — IMO0002 Reserved for concepts with insufficient information to code with codable children: Secondary | ICD-10-CM | POA: Diagnosis not present

## 2011-12-30 DIAGNOSIS — M999 Biomechanical lesion, unspecified: Secondary | ICD-10-CM | POA: Diagnosis not present

## 2011-12-30 DIAGNOSIS — IMO0002 Reserved for concepts with insufficient information to code with codable children: Secondary | ICD-10-CM | POA: Diagnosis not present

## 2011-12-30 DIAGNOSIS — M5137 Other intervertebral disc degeneration, lumbosacral region: Secondary | ICD-10-CM | POA: Diagnosis not present

## 2012-01-03 DIAGNOSIS — IMO0002 Reserved for concepts with insufficient information to code with codable children: Secondary | ICD-10-CM | POA: Diagnosis not present

## 2012-01-03 DIAGNOSIS — M999 Biomechanical lesion, unspecified: Secondary | ICD-10-CM | POA: Diagnosis not present

## 2012-01-03 DIAGNOSIS — M5137 Other intervertebral disc degeneration, lumbosacral region: Secondary | ICD-10-CM | POA: Diagnosis not present

## 2012-01-05 DIAGNOSIS — M5137 Other intervertebral disc degeneration, lumbosacral region: Secondary | ICD-10-CM | POA: Diagnosis not present

## 2012-01-05 DIAGNOSIS — M999 Biomechanical lesion, unspecified: Secondary | ICD-10-CM | POA: Diagnosis not present

## 2012-01-05 DIAGNOSIS — IMO0002 Reserved for concepts with insufficient information to code with codable children: Secondary | ICD-10-CM | POA: Diagnosis not present

## 2012-01-10 DIAGNOSIS — M5137 Other intervertebral disc degeneration, lumbosacral region: Secondary | ICD-10-CM | POA: Diagnosis not present

## 2012-01-10 DIAGNOSIS — IMO0002 Reserved for concepts with insufficient information to code with codable children: Secondary | ICD-10-CM | POA: Diagnosis not present

## 2012-01-10 DIAGNOSIS — H26499 Other secondary cataract, unspecified eye: Secondary | ICD-10-CM | POA: Diagnosis not present

## 2012-01-10 DIAGNOSIS — M999 Biomechanical lesion, unspecified: Secondary | ICD-10-CM | POA: Diagnosis not present

## 2012-01-12 DIAGNOSIS — M5137 Other intervertebral disc degeneration, lumbosacral region: Secondary | ICD-10-CM | POA: Diagnosis not present

## 2012-01-12 DIAGNOSIS — M999 Biomechanical lesion, unspecified: Secondary | ICD-10-CM | POA: Diagnosis not present

## 2012-01-12 DIAGNOSIS — IMO0002 Reserved for concepts with insufficient information to code with codable children: Secondary | ICD-10-CM | POA: Diagnosis not present

## 2012-01-17 DIAGNOSIS — IMO0002 Reserved for concepts with insufficient information to code with codable children: Secondary | ICD-10-CM | POA: Diagnosis not present

## 2012-01-17 DIAGNOSIS — M5137 Other intervertebral disc degeneration, lumbosacral region: Secondary | ICD-10-CM | POA: Diagnosis not present

## 2012-01-17 DIAGNOSIS — M999 Biomechanical lesion, unspecified: Secondary | ICD-10-CM | POA: Diagnosis not present

## 2012-01-24 DIAGNOSIS — M5137 Other intervertebral disc degeneration, lumbosacral region: Secondary | ICD-10-CM | POA: Diagnosis not present

## 2012-01-24 DIAGNOSIS — M999 Biomechanical lesion, unspecified: Secondary | ICD-10-CM | POA: Diagnosis not present

## 2012-01-24 DIAGNOSIS — IMO0002 Reserved for concepts with insufficient information to code with codable children: Secondary | ICD-10-CM | POA: Diagnosis not present

## 2012-02-23 DIAGNOSIS — I4891 Unspecified atrial fibrillation: Secondary | ICD-10-CM | POA: Diagnosis not present

## 2012-02-23 DIAGNOSIS — I2510 Atherosclerotic heart disease of native coronary artery without angina pectoris: Secondary | ICD-10-CM | POA: Diagnosis not present

## 2012-02-23 DIAGNOSIS — E785 Hyperlipidemia, unspecified: Secondary | ICD-10-CM | POA: Diagnosis not present

## 2012-02-23 DIAGNOSIS — I10 Essential (primary) hypertension: Secondary | ICD-10-CM | POA: Diagnosis not present

## 2012-02-23 DIAGNOSIS — I359 Nonrheumatic aortic valve disorder, unspecified: Secondary | ICD-10-CM | POA: Diagnosis not present

## 2012-02-28 DIAGNOSIS — I10 Essential (primary) hypertension: Secondary | ICD-10-CM | POA: Diagnosis not present

## 2012-02-28 DIAGNOSIS — I359 Nonrheumatic aortic valve disorder, unspecified: Secondary | ICD-10-CM | POA: Diagnosis not present

## 2012-02-28 DIAGNOSIS — E785 Hyperlipidemia, unspecified: Secondary | ICD-10-CM | POA: Diagnosis not present

## 2012-02-28 DIAGNOSIS — I4891 Unspecified atrial fibrillation: Secondary | ICD-10-CM | POA: Diagnosis not present

## 2012-02-28 DIAGNOSIS — I2510 Atherosclerotic heart disease of native coronary artery without angina pectoris: Secondary | ICD-10-CM | POA: Diagnosis not present

## 2012-03-07 DIAGNOSIS — Z23 Encounter for immunization: Secondary | ICD-10-CM | POA: Diagnosis not present

## 2012-06-05 DIAGNOSIS — R109 Unspecified abdominal pain: Secondary | ICD-10-CM | POA: Diagnosis not present

## 2012-06-05 DIAGNOSIS — R300 Dysuria: Secondary | ICD-10-CM | POA: Diagnosis not present

## 2012-06-06 DIAGNOSIS — R109 Unspecified abdominal pain: Secondary | ICD-10-CM | POA: Diagnosis not present

## 2012-06-06 DIAGNOSIS — N509 Disorder of male genital organs, unspecified: Secondary | ICD-10-CM | POA: Diagnosis not present

## 2012-06-09 DIAGNOSIS — K5900 Constipation, unspecified: Secondary | ICD-10-CM | POA: Diagnosis not present

## 2012-06-09 DIAGNOSIS — K5730 Diverticulosis of large intestine without perforation or abscess without bleeding: Secondary | ICD-10-CM | POA: Diagnosis not present

## 2012-06-19 DIAGNOSIS — R1032 Left lower quadrant pain: Secondary | ICD-10-CM | POA: Diagnosis not present

## 2012-06-19 DIAGNOSIS — K8020 Calculus of gallbladder without cholecystitis without obstruction: Secondary | ICD-10-CM | POA: Diagnosis not present

## 2012-06-27 DIAGNOSIS — L570 Actinic keratosis: Secondary | ICD-10-CM | POA: Diagnosis not present

## 2012-06-27 DIAGNOSIS — D485 Neoplasm of uncertain behavior of skin: Secondary | ICD-10-CM | POA: Diagnosis not present

## 2012-06-27 DIAGNOSIS — L821 Other seborrheic keratosis: Secondary | ICD-10-CM | POA: Diagnosis not present

## 2012-08-01 DIAGNOSIS — Z125 Encounter for screening for malignant neoplasm of prostate: Secondary | ICD-10-CM | POA: Diagnosis not present

## 2012-08-01 DIAGNOSIS — E039 Hypothyroidism, unspecified: Secondary | ICD-10-CM | POA: Diagnosis not present

## 2012-08-01 DIAGNOSIS — R7301 Impaired fasting glucose: Secondary | ICD-10-CM | POA: Diagnosis not present

## 2012-08-01 DIAGNOSIS — I10 Essential (primary) hypertension: Secondary | ICD-10-CM | POA: Diagnosis not present

## 2012-08-01 DIAGNOSIS — I2510 Atherosclerotic heart disease of native coronary artery without angina pectoris: Secondary | ICD-10-CM | POA: Diagnosis not present

## 2012-08-08 DIAGNOSIS — Z0000 Encounter for general adult medical examination without abnormal findings: Secondary | ICD-10-CM | POA: Diagnosis not present

## 2012-08-08 DIAGNOSIS — Z125 Encounter for screening for malignant neoplasm of prostate: Secondary | ICD-10-CM | POA: Diagnosis not present

## 2012-08-08 DIAGNOSIS — E785 Hyperlipidemia, unspecified: Secondary | ICD-10-CM | POA: Diagnosis not present

## 2012-08-08 DIAGNOSIS — I10 Essential (primary) hypertension: Secondary | ICD-10-CM | POA: Diagnosis not present

## 2012-08-10 DIAGNOSIS — Z23 Encounter for immunization: Secondary | ICD-10-CM | POA: Diagnosis not present

## 2012-08-11 DIAGNOSIS — Z1212 Encounter for screening for malignant neoplasm of rectum: Secondary | ICD-10-CM | POA: Diagnosis not present

## 2012-12-26 DIAGNOSIS — L259 Unspecified contact dermatitis, unspecified cause: Secondary | ICD-10-CM | POA: Diagnosis not present

## 2012-12-26 DIAGNOSIS — L570 Actinic keratosis: Secondary | ICD-10-CM | POA: Diagnosis not present

## 2012-12-26 DIAGNOSIS — L821 Other seborrheic keratosis: Secondary | ICD-10-CM | POA: Diagnosis not present

## 2013-01-18 DIAGNOSIS — H26499 Other secondary cataract, unspecified eye: Secondary | ICD-10-CM | POA: Diagnosis not present

## 2013-02-13 DIAGNOSIS — R7301 Impaired fasting glucose: Secondary | ICD-10-CM | POA: Diagnosis not present

## 2013-02-13 DIAGNOSIS — I2510 Atherosclerotic heart disease of native coronary artery without angina pectoris: Secondary | ICD-10-CM | POA: Diagnosis not present

## 2013-02-13 DIAGNOSIS — E039 Hypothyroidism, unspecified: Secondary | ICD-10-CM | POA: Diagnosis not present

## 2013-02-13 DIAGNOSIS — Z23 Encounter for immunization: Secondary | ICD-10-CM | POA: Diagnosis not present

## 2013-02-13 DIAGNOSIS — Z1331 Encounter for screening for depression: Secondary | ICD-10-CM | POA: Diagnosis not present

## 2013-02-13 DIAGNOSIS — I10 Essential (primary) hypertension: Secondary | ICD-10-CM | POA: Diagnosis not present

## 2013-02-13 DIAGNOSIS — Z6825 Body mass index (BMI) 25.0-25.9, adult: Secondary | ICD-10-CM | POA: Diagnosis not present

## 2013-02-21 DIAGNOSIS — I10 Essential (primary) hypertension: Secondary | ICD-10-CM | POA: Diagnosis not present

## 2013-02-21 DIAGNOSIS — I2510 Atherosclerotic heart disease of native coronary artery without angina pectoris: Secondary | ICD-10-CM | POA: Diagnosis not present

## 2013-02-21 DIAGNOSIS — E785 Hyperlipidemia, unspecified: Secondary | ICD-10-CM | POA: Diagnosis not present

## 2013-02-21 DIAGNOSIS — I359 Nonrheumatic aortic valve disorder, unspecified: Secondary | ICD-10-CM | POA: Diagnosis not present

## 2013-02-21 DIAGNOSIS — I4891 Unspecified atrial fibrillation: Secondary | ICD-10-CM | POA: Diagnosis not present

## 2013-04-18 ENCOUNTER — Other Ambulatory Visit: Payer: Self-pay | Admitting: Interventional Cardiology

## 2013-04-25 DIAGNOSIS — D485 Neoplasm of uncertain behavior of skin: Secondary | ICD-10-CM | POA: Diagnosis not present

## 2013-04-25 DIAGNOSIS — L739 Follicular disorder, unspecified: Secondary | ICD-10-CM | POA: Diagnosis not present

## 2013-04-25 DIAGNOSIS — D1801 Hemangioma of skin and subcutaneous tissue: Secondary | ICD-10-CM | POA: Diagnosis not present

## 2013-08-13 DIAGNOSIS — E039 Hypothyroidism, unspecified: Secondary | ICD-10-CM | POA: Diagnosis not present

## 2013-08-13 DIAGNOSIS — Z125 Encounter for screening for malignant neoplasm of prostate: Secondary | ICD-10-CM | POA: Diagnosis not present

## 2013-08-13 DIAGNOSIS — R7301 Impaired fasting glucose: Secondary | ICD-10-CM | POA: Diagnosis not present

## 2013-08-13 DIAGNOSIS — Z79899 Other long term (current) drug therapy: Secondary | ICD-10-CM | POA: Diagnosis not present

## 2013-08-13 DIAGNOSIS — I2510 Atherosclerotic heart disease of native coronary artery without angina pectoris: Secondary | ICD-10-CM | POA: Diagnosis not present

## 2013-08-13 DIAGNOSIS — I10 Essential (primary) hypertension: Secondary | ICD-10-CM | POA: Diagnosis not present

## 2013-08-20 DIAGNOSIS — E871 Hypo-osmolality and hyponatremia: Secondary | ICD-10-CM | POA: Diagnosis not present

## 2013-08-20 DIAGNOSIS — R7301 Impaired fasting glucose: Secondary | ICD-10-CM | POA: Diagnosis not present

## 2013-08-20 DIAGNOSIS — I2510 Atherosclerotic heart disease of native coronary artery without angina pectoris: Secondary | ICD-10-CM | POA: Diagnosis not present

## 2013-08-20 DIAGNOSIS — N182 Chronic kidney disease, stage 2 (mild): Secondary | ICD-10-CM | POA: Diagnosis not present

## 2013-08-20 DIAGNOSIS — H9190 Unspecified hearing loss, unspecified ear: Secondary | ICD-10-CM | POA: Diagnosis not present

## 2013-08-20 DIAGNOSIS — Z0000 Encounter for general adult medical examination without abnormal findings: Secondary | ICD-10-CM | POA: Diagnosis not present

## 2013-08-20 DIAGNOSIS — R609 Edema, unspecified: Secondary | ICD-10-CM | POA: Diagnosis not present

## 2013-08-20 DIAGNOSIS — M549 Dorsalgia, unspecified: Secondary | ICD-10-CM | POA: Diagnosis not present

## 2013-08-21 DIAGNOSIS — Z1212 Encounter for screening for malignant neoplasm of rectum: Secondary | ICD-10-CM | POA: Diagnosis not present

## 2013-11-29 ENCOUNTER — Emergency Department (HOSPITAL_COMMUNITY)
Admission: EM | Admit: 2013-11-29 | Discharge: 2013-11-29 | Disposition: A | Discharge status: Home / Self Care | Payer: Medicare Other | Attending: Emergency Medicine | Admitting: Emergency Medicine

## 2013-11-29 ENCOUNTER — Emergency Department (HOSPITAL_COMMUNITY): Payer: Medicare Other

## 2013-11-29 ENCOUNTER — Encounter (HOSPITAL_COMMUNITY): Payer: Self-pay | Admitting: Emergency Medicine

## 2013-11-29 DIAGNOSIS — Z87891 Personal history of nicotine dependence: Secondary | ICD-10-CM | POA: Diagnosis not present

## 2013-11-29 DIAGNOSIS — I10 Essential (primary) hypertension: Secondary | ICD-10-CM | POA: Diagnosis not present

## 2013-11-29 DIAGNOSIS — E7800 Pure hypercholesterolemia, unspecified: Secondary | ICD-10-CM | POA: Insufficient documentation

## 2013-11-29 DIAGNOSIS — S1093XA Contusion of unspecified part of neck, initial encounter: Principal | ICD-10-CM

## 2013-11-29 DIAGNOSIS — R011 Cardiac murmur, unspecified: Secondary | ICD-10-CM | POA: Insufficient documentation

## 2013-11-29 DIAGNOSIS — S0990XA Unspecified injury of head, initial encounter: Secondary | ICD-10-CM | POA: Diagnosis not present

## 2013-11-29 DIAGNOSIS — S0003XA Contusion of scalp, initial encounter: Secondary | ICD-10-CM | POA: Insufficient documentation

## 2013-11-29 DIAGNOSIS — Z7982 Long term (current) use of aspirin: Secondary | ICD-10-CM | POA: Insufficient documentation

## 2013-11-29 DIAGNOSIS — Y9389 Activity, other specified: Secondary | ICD-10-CM | POA: Insufficient documentation

## 2013-11-29 DIAGNOSIS — Z79899 Other long term (current) drug therapy: Secondary | ICD-10-CM | POA: Diagnosis not present

## 2013-11-29 DIAGNOSIS — S0083XA Contusion of other part of head, initial encounter: Secondary | ICD-10-CM | POA: Diagnosis not present

## 2013-11-29 DIAGNOSIS — Y9289 Other specified places as the place of occurrence of the external cause: Secondary | ICD-10-CM | POA: Insufficient documentation

## 2013-11-29 DIAGNOSIS — W1809XA Striking against other object with subsequent fall, initial encounter: Secondary | ICD-10-CM | POA: Insufficient documentation

## 2013-11-29 DIAGNOSIS — S0993XA Unspecified injury of face, initial encounter: Secondary | ICD-10-CM | POA: Diagnosis not present

## 2013-11-29 DIAGNOSIS — W19XXXA Unspecified fall, initial encounter: Secondary | ICD-10-CM

## 2013-11-29 DIAGNOSIS — T1490XA Injury, unspecified, initial encounter: Secondary | ICD-10-CM | POA: Diagnosis not present

## 2013-11-29 HISTORY — DX: Essential (primary) hypertension: I10

## 2013-11-29 HISTORY — DX: Pure hypercholesterolemia, unspecified: E78.00

## 2013-11-29 NOTE — ED Provider Notes (Signed)
CSN: [id number]     Arrival date & time 11/29/13  1452 History   First MD Initiated Contact with Patient 11/29/13 1503     Chief Complaint  Patient presents with  . Fall     (Consider location/radiation/quality/duration/timing/severity/associated sxs/prior Treatment) HPI Comments: 80 year old male presents after falling while at [employer]. The shopping cart slipped out from underneath him, causing him to fall. He struck his head. He is unsure, but thinks he may have very briefly lost consciousness. He has mild pain in his right index finger. He denies any other injuries or pain.  Patient is a 80 y.o. male presenting with fall.  Fall This is a new problem. The current episode started 1 to 2 hours ago. Episode frequency: Once. The problem has been resolved. Pertinent negatives include no chest pain, no abdominal pain, no headaches and no shortness of breath. Nothing aggravates the symptoms. Nothing relieves the symptoms.    Past Medical History  Diagnosis Date  . Hypertension   . Hypercholesteremia    Past Surgical History  Procedure Laterality Date  . Bypass graft     No family history on file. History  Substance Use Topics  . Smoking status: Former Research scientist (life sciences)  . Smokeless tobacco: Not on file  . Alcohol Use: Yes    Review of Systems  Respiratory: Negative for shortness of breath.   Cardiovascular: Negative for chest pain.  Gastrointestinal: Negative for abdominal pain.  Neurological: Negative for headaches.  All other systems reviewed and are negative.     Allergies  Shellfish allergy  Home Medications   Prior to Admission medications   Medication Sig Start Date End Date Taking? Authorizing Laconya Clere  acidophilus (RISAQUAD) CAPS capsule Take 1 capsule by mouth daily.   Yes Historical Quintrell Baze, MD  aspirin 81 MG tablet Take 81 mg by mouth daily.   Yes Historical Marcellous Snarski, MD  cholecalciferol (VITAMIN D) 1000 UNITS tablet Take 1,000 Units by mouth daily.   Yes Historical  Awad Gladd, MD  levothyroxine (SYNTHROID, LEVOTHROID) 25 MCG tablet Take 25 mcg by mouth daily before breakfast.   Yes Historical Jamell Laymon, MD  Multiple Vitamin (MULTIVITAMIN WITH MINERALS) TABS tablet Take 1 tablet by mouth daily.   Yes Historical Rob Mciver, MD  telmisartan-hydrochlorothiazide (MICARDIS HCT) 80-12.5 MG per tablet Take 1 tablet by mouth daily.   Yes Historical Sharrell Krawiec, MD  zolpidem (AMBIEN) 10 MG tablet Take 5 mg by mouth at bedtime as needed for sleep.   Yes Historical Makensie Mulhall, MD   BP 178/70  Pulse 65  Temp(Src) 98.5 F (36.9 C) (Oral)  Resp 20  SpO2 97% Physical Exam  Nursing note and vitals reviewed. Constitutional: He is oriented to person, place, and time. He appears well-developed and well-nourished. No distress.  HENT:  Head: Normocephalic and atraumatic. Head is without raccoon's eyes and without Battle's sign.    Nose: Nose normal.  Eyes: Conjunctivae and EOM are normal. Pupils are equal, round, and reactive to light. No scleral icterus.  Neck: Normal range of motion. No spinous process tenderness and no muscular tenderness present.  Cardiovascular: Normal rate, regular rhythm and intact distal pulses.   Murmur heard.  Systolic murmur is present with a grade of 2/6  Pulmonary/Chest: Effort normal and breath sounds normal. He has no rales. He exhibits no tenderness.  Abdominal: Soft. There is no tenderness. There is no rebound and no guarding.  Musculoskeletal: Normal range of motion. He exhibits no edema and no tenderness.       Thoracic back: He exhibits  no tenderness and no bony tenderness.       Lumbar back: He exhibits no tenderness and no bony tenderness.       Right hand: He exhibits normal range of motion, normal capillary refill, no deformity and no laceration (small abrasion to index finger).  No evidence of trauma to extremities, except as noted.  2+ distal pulses.    Neurological: He is alert and oriented to person, place, and time.  Skin: Skin is  warm and dry. No rash noted.  Psychiatric: He has a normal mood and affect.    ED Course  Procedures (including critical care time) Labs Review Labs Reviewed - No data to display  Imaging Review Ct Head Wo Contrast  11/29/2013   CLINICAL DATA:  Status post fall  EXAM: CT HEAD WITHOUT CONTRAST  CT CERVICAL SPINE WITHOUT CONTRAST  TECHNIQUE: Multidetector CT imaging of the head and cervical spine was performed following the standard protocol without intravenous contrast. Multiplanar CT image reconstructions of the cervical spine were also generated.  COMPARISON:  None.  FINDINGS: CT HEAD FINDINGS  There is no evidence of mass effect, midline shift, or extra-axial fluid collections. There is no evidence of a space-occupying lesion or intracranial hemorrhage. There is no evidence of a cortical-based area of acute infarction. There is generalized cerebral atrophy. There is periventricular white matter low attenuation likely secondary to microangiopathy.  The ventricles and sulci are appropriate for the patient's age. The basal cisterns are patent.  Visualized portions of the orbits are unremarkable. The visualized portions of the paranasal sinuses and mastoid air cells are unremarkable. Cerebrovascular atherosclerotic calcifications are noted.  The osseous structures are unremarkable. Left parietal scalp contusion.  CT CERVICAL SPINE FINDINGS  The alignment is anatomic. The vertebral body heights are maintained. There is no acute fracture. The prevertebral soft tissues are normal. The intraspinal soft tissues are not fully imaged on this examination due to poor soft tissue contrast, but there is no gross soft tissue abnormality.  There is degenerative disc disease of the cervical spine. There is 4 mm of anterolisthesis of C3 on C4 secondary to severe bilateral facet arthropathy. There is severe bilateral foraminal stenosis at C3-4. There is a broad-based disc bulge at C3-4. There is 3 mm of anterolisthesis of  C4 on C5 secondary to severe bilateral facet arthropathy. There are bilateral uncovertebral degenerative changes at C4-5 resulting in foraminal encroachment.  There is osseous fusion across the C5-C6 disc space. There is severe degenerative disc disease at C6-7. There are broad-based disc osteophyte complexes at C5-6 and C6-7. There is severe facet arthropathy bilaterally at C5-6.  There is a spiculated airspace opacity measuring approximately 2 cm in the left upper lobe.There are degenerative changes of bilateral temporomandibular joints.  IMPRESSION: 1. No acute intracranial pathology. 2. No acute osseous injury of the cervical spine. 3. Cervical spine spondylosis as described above. 4. There is a 2 cm spiculated left apical airspace opacity of uncertain etiology and incompletely visualized. Recommend a dedicated nonemergent CT of the chest for further evaluation.   Electronically Signed   By: Kathreen Devoid   On: 11/29/2013 16:31   Ct Cervical Spine Wo Contrast  11/29/2013   CLINICAL DATA:  Status post fall  EXAM: CT HEAD WITHOUT CONTRAST  CT CERVICAL SPINE WITHOUT CONTRAST  TECHNIQUE: Multidetector CT imaging of the head and cervical spine was performed following the standard protocol without intravenous contrast. Multiplanar CT image reconstructions of the cervical spine were also generated.  COMPARISON:  None.  FINDINGS: CT HEAD FINDINGS  There is no evidence of mass effect, midline shift, or extra-axial fluid collections. There is no evidence of a space-occupying lesion or intracranial hemorrhage. There is no evidence of a cortical-based area of acute infarction. There is generalized cerebral atrophy. There is periventricular white matter low attenuation likely secondary to microangiopathy.  The ventricles and sulci are appropriate for the patient's age. The basal cisterns are patent.  Visualized portions of the orbits are unremarkable. The visualized portions of the paranasal sinuses and mastoid air cells  are unremarkable. Cerebrovascular atherosclerotic calcifications are noted.  The osseous structures are unremarkable. Left parietal scalp contusion.  CT CERVICAL SPINE FINDINGS  The alignment is anatomic. The vertebral body heights are maintained. There is no acute fracture. The prevertebral soft tissues are normal. The intraspinal soft tissues are not fully imaged on this examination due to poor soft tissue contrast, but there is no gross soft tissue abnormality.  There is degenerative disc disease of the cervical spine. There is 4 mm of anterolisthesis of C3 on C4 secondary to severe bilateral facet arthropathy. There is severe bilateral foraminal stenosis at C3-4. There is a broad-based disc bulge at C3-4. There is 3 mm of anterolisthesis of C4 on C5 secondary to severe bilateral facet arthropathy. There are bilateral uncovertebral degenerative changes at C4-5 resulting in foraminal encroachment.  There is osseous fusion across the C5-C6 disc space. There is severe degenerative disc disease at C6-7. There are broad-based disc osteophyte complexes at C5-6 and C6-7. There is severe facet arthropathy bilaterally at C5-6.  There is a spiculated airspace opacity measuring approximately 2 cm in the left upper lobe.There are degenerative changes of bilateral temporomandibular joints.  IMPRESSION: 1. No acute intracranial pathology. 2. No acute osseous injury of the cervical spine. 3. Cervical spine spondylosis as described above. 4. There is a 2 cm spiculated left apical airspace opacity of uncertain etiology and incompletely visualized. Recommend a dedicated nonemergent CT of the chest for further evaluation.   Electronically Signed   By: Kathreen Devoid   On: 11/29/2013 16:31  All radiology studies independently viewed by me.      EKG Interpretation   Date/Time:  Thursday November 29 2013 14:58:44 EDT Ventricular Rate:  64 PR Interval:  202 QRS Duration: 112 QT Interval:  434 QTC Calculation: 448 R Axis:    7 Text Interpretation:  Sinus arrhythmia Borderline intraventricular  conduction delay Borderline repolarization abnormality compared to prior,  nonspecific t wave abnormality more prominent Confirmed by [facility]  MD,  TREY ([id number]) on 11/29/2013 3:42:54 PM      MDM   Final diagnoses:  Fall, initial encounter  Contusion of scalp, initial encounter    80 year old male presenting after mechanical fall. Has apparently mild head trauma. On aspirin. Will obtain CT head and neck.  CTs negative.  He knows about the lung mass.  Plan ambulate and DC.  Houston Siren III, MD 11/29/13 [phone]

## 2013-11-29 NOTE — ED Notes (Signed)
Patient presents today via EMS with a chief complaint of fall while losing his balance at [employer]. Patient reports the cart slipped out in front of him which caused the loss of balance. Denies dizziness, chest pain but does have a cardiac history.

## 2013-11-29 NOTE — ED Notes (Signed)
Pt ambulatory to BR with independent, steady gait. Pt denies dizziness or lightheadedness.

## 2013-11-29 NOTE — Discharge Instructions (Signed)
Facial or Scalp Contusion °A facial or scalp contusion is a deep bruise on the face or head. Injuries to the face and head generally cause a lot of swelling, especially around the eyes. Contusions are the result of an injury that caused bleeding under the skin. The contusion may turn blue, purple, or yellow. Minor injuries will give you a painless contusion, but more severe contusions may stay painful and swollen for a few weeks.  °CAUSES  °A facial or scalp contusion is caused by a blunt injury or trauma to the face or head area.  °SIGNS AND SYMPTOMS  °· Swelling of the injured area.   °· Discoloration of the injured area.   °· Tenderness, soreness, or pain in the injured area.   °DIAGNOSIS  °The diagnosis can be made by taking a medical history and doing a physical exam. An X-ray exam, CT scan, or MRI may be needed to determine if there are any associated injuries, such as broken bones (fractures). °TREATMENT  °Often, the best treatment for a facial or scalp contusion is applying cold compresses to the injured area. Over-the-counter medicines may also be recommended for pain control.  °HOME CARE INSTRUCTIONS  °· Only take over-the-counter or prescription medicines as directed by your health care provider.   °· Apply ice to the injured area.   °· Put ice in a plastic bag.   °· Place a towel between your skin and the bag.   °· Leave the ice on for 20 minutes, 2-3 times a day.   °SEEK MEDICAL CARE IF: °· You have bite problems.   °· You have pain with chewing.   °· You are concerned about facial defects. °SEEK IMMEDIATE MEDICAL CARE IF: °· You have severe pain or a headache that is not relieved by medicine.   °· You have unusual sleepiness, confusion, or personality changes.   °· You throw up (vomit).   °· You have a persistent nosebleed.   °· You have double vision or blurred vision.   °· You have fluid drainage from your nose or ear.   °· You have difficulty walking or using your arms or legs.   °MAKE SURE YOU:   °· Understand these instructions. °· Will watch your condition. °· Will get help right away if you are not doing well or get worse. °Document Released: 07/08/2004 Document Revised: 03/21/2013 Document Reviewed: 01/11/2013 °ExitCare® Patient Information ©2015 [employer]. This information is not intended to replace advice given to you by your health care provider. Make sure you discuss any questions you have with your health care provider. ° °Fall Prevention and Home Safety °Falls cause injuries and can affect all age groups. It is possible to use preventive measures to significantly decrease the likelihood of falls. There are many simple measures which can make your home safer and prevent falls. °OUTDOORS °· Repair cracks and edges of walkways and driveways. °· Remove high doorway thresholds. °· Trim shrubbery on the main path into your home. °· Have good outside lighting. °· Clear walkways of tools, rocks, debris, and clutter. °· Check that handrails are not broken and are securely fastened. Both sides of steps should have handrails. °· Have leaves, snow, and ice cleared regularly. °· Use sand or salt on walkways during winter months. °· In the garage, clean up grease or oil spills. °BATHROOM °· Install night lights. °· Install grab bars by the toilet and in the tub and shower. °· Use non-skid mats or decals in the tub or shower. °· Place a plastic non-slip stool in the shower to sit on, if needed. °·   Keep floors dry and clean up all water on the floor immediately. °· Remove soap buildup in the tub or shower on a regular basis. °· Secure bath mats with non-slip, double-sided rug tape. °· Remove throw rugs and tripping hazards from the floors. °BEDROOMS °· Install night lights. °· Make sure a bedside light is easy to reach. °· Do not use oversized bedding. °· Keep a telephone by your bedside. °· Have a firm chair with side arms to use for getting dressed. °· Remove throw rugs and tripping hazards from the  floor. °KITCHEN °· Keep handles on pots and pans turned toward the center of the stove. Use back burners when possible. °· Clean up spills quickly and allow time for drying. °· Avoid walking on wet floors. °· Avoid hot utensils and knives. °· Position shelves so they are not too high or low. °· Place commonly used objects within easy reach. °· If necessary, use a sturdy step stool with a grab bar when reaching. °· Keep electrical cables out of the way. °· Do not use floor polish or wax that makes floors slippery. If you must use wax, use non-skid floor wax. °· Remove throw rugs and tripping hazards from the floor. °STAIRWAYS °· Never leave objects on stairs. °· Place handrails on both sides of stairways and use them. Fix any loose handrails. Make sure handrails on both sides of the stairways are as long as the stairs. °· Check carpeting to make sure it is firmly attached along stairs. Make repairs to worn or loose carpet promptly. °· Avoid placing throw rugs at the top or bottom of stairways, or properly secure the rug with carpet tape to prevent slippage. Get rid of throw rugs, if possible. °· Have an electrician put in a light switch at the top and bottom of the stairs. °OTHER FALL PREVENTION TIPS °· Wear low-heel or rubber-soled shoes that are supportive and fit well. Wear closed toe shoes. °· When using a stepladder, make sure it is fully opened and both spreaders are firmly locked. Do not climb a closed stepladder. °· Add color or contrast paint or tape to grab bars and handrails in your home. Place contrasting color strips on first and last steps. °· Learn and use mobility aids as needed. Install an electrical emergency response system. °· Turn on lights to avoid dark areas. Replace light bulbs that burn out immediately. Get light switches that glow. °· Arrange furniture to create clear pathways. Keep furniture in the same place. °· Firmly attach carpet with non-skid or double-sided tape. °· Eliminate uneven  floor surfaces. °· Select a carpet pattern that does not visually hide the edge of steps. °· Be aware of all pets. °OTHER HOME SAFETY TIPS °· Set the water temperature for 120° F (48.8° C). °· Keep emergency numbers on or near the telephone. °· Keep smoke detectors on every level of the home and near sleeping areas. °Document Released: 05/21/2002 Document Revised: 11/30/2011 Document Reviewed: 08/20/2011 °ExitCare® Patient Information ©2015 [employer]. This information is not intended to replace advice given to you by your health care provider. Make sure you discuss any questions you have with your health care provider. ° °

## 2013-11-29 NOTE — ED Notes (Signed)
Bed: WA07 Expected date:  Expected time:  Means of arrival:  Comments: 80 y/o M, [place] [employer]

## 2013-12-05 ENCOUNTER — Encounter: Payer: Self-pay | Admitting: *Deleted

## 2013-12-05 ENCOUNTER — Encounter: Payer: Self-pay | Admitting: Interventional Cardiology

## 2013-12-05 DIAGNOSIS — I10 Essential (primary) hypertension: Secondary | ICD-10-CM | POA: Insufficient documentation

## 2013-12-05 DIAGNOSIS — I480 Paroxysmal atrial fibrillation: Secondary | ICD-10-CM | POA: Insufficient documentation

## 2013-12-05 DIAGNOSIS — E7800 Pure hypercholesterolemia, unspecified: Secondary | ICD-10-CM | POA: Insufficient documentation

## 2013-12-05 DIAGNOSIS — E785 Hyperlipidemia, unspecified: Secondary | ICD-10-CM | POA: Insufficient documentation

## 2013-12-05 DIAGNOSIS — I2510 Atherosclerotic heart disease of native coronary artery without angina pectoris: Secondary | ICD-10-CM | POA: Insufficient documentation

## 2013-12-05 DIAGNOSIS — R011 Cardiac murmur, unspecified: Secondary | ICD-10-CM | POA: Insufficient documentation

## 2013-12-05 DIAGNOSIS — E039 Hypothyroidism, unspecified: Secondary | ICD-10-CM | POA: Insufficient documentation

## 2013-12-05 DIAGNOSIS — K5790 Diverticulosis of intestine, part unspecified, without perforation or abscess without bleeding: Secondary | ICD-10-CM | POA: Insufficient documentation

## 2013-12-17 DIAGNOSIS — I10 Essential (primary) hypertension: Secondary | ICD-10-CM | POA: Diagnosis not present

## 2014-01-21 DIAGNOSIS — H26499 Other secondary cataract, unspecified eye: Secondary | ICD-10-CM | POA: Diagnosis not present

## 2014-02-26 ENCOUNTER — Encounter: Payer: Self-pay | Admitting: Interventional Cardiology

## 2014-02-26 ENCOUNTER — Telehealth: Payer: Self-pay | Admitting: Interventional Cardiology

## 2014-02-26 ENCOUNTER — Ambulatory Visit (INDEPENDENT_AMBULATORY_CARE_PROVIDER_SITE_OTHER): Payer: Medicare Other | Admitting: Interventional Cardiology

## 2014-02-26 VITALS — BP 142/58 | HR 58 | Ht 67.0 in | Wt 169.0 lb

## 2014-02-26 DIAGNOSIS — I4892 Unspecified atrial flutter: Secondary | ICD-10-CM | POA: Diagnosis not present

## 2014-02-26 DIAGNOSIS — I10 Essential (primary) hypertension: Secondary | ICD-10-CM

## 2014-02-26 DIAGNOSIS — I4891 Unspecified atrial fibrillation: Secondary | ICD-10-CM

## 2014-02-26 DIAGNOSIS — I359 Nonrheumatic aortic valve disorder, unspecified: Secondary | ICD-10-CM | POA: Diagnosis not present

## 2014-02-26 DIAGNOSIS — I480 Paroxysmal atrial fibrillation: Secondary | ICD-10-CM

## 2014-02-26 DIAGNOSIS — E7800 Pure hypercholesterolemia, unspecified: Secondary | ICD-10-CM

## 2014-02-26 DIAGNOSIS — I350 Nonrheumatic aortic (valve) stenosis: Secondary | ICD-10-CM

## 2014-02-26 DIAGNOSIS — I25810 Atherosclerosis of coronary artery bypass graft(s) without angina pectoris: Secondary | ICD-10-CM | POA: Diagnosis not present

## 2014-02-26 MED ORDER — TELMISARTAN 20 MG PO TABS: 20.0000 mg | ORAL_TABLET | Freq: Every day | ORAL | Status: DC

## 2014-02-26 NOTE — Telephone Encounter (Signed)
pt was to call back to verify if he is still taking telmisartan 20mg  qd. pt still is taking medication.med list update, no changes to be made. pt wife verbalized understanding

## 2014-02-26 NOTE — Patient Instructions (Addendum)
Your physician recommends that you continue on your current medications as directed. Please refer to the Current Medication list given to you today.  Your physician wants you to follow-up in: 1 year with Dr.Smith You will receive a reminder letter in the mail two months in advance. If you don't receive a letter, please call our office to schedule the follow-up appointment.  

## 2014-02-26 NOTE — Progress Notes (Signed)
Patient ID: Billy Barnes, male   DOB: 07-06-1920, 80 y.o.   MRN: [id number]    [address], [address], [place]  [address] Phone: [phone] Fax:  [phone]  Date:  02/26/2014   ID:  Billy Barnes, DOB October 01, 1920, MRN [id number]  PCP:  No primary provider on file.   ASSESSMENT:  1. Coronary artery disease, asymptomatic 2. Hypertension, the patient feels that he is now off of myocarditis HCT 3. Hyperlipidemia followed by Dr. Randolm Idol 4. Mild to moderate aortic stenosis, asymptomatic  PLAN:  1. verify current antihypertensive regimen 2. We'll followup in one year 3. No physiologic/functional testing currently given the patient's asymptomatic state   SUBJECTIVE: Billy Barnes is a 80 y.o. male had some difficulty with balance. Otherwise no complaints. He believes his been taken off of antihypertensive therapy. He has not had syncope. Denies lower extremity swelling, chest discomfort, and dyspnea.   Wt Readings from Last 3 Encounters:  02/26/14 169 lb (76.658 kg)     Past Medical History  Diagnosis Date  . Hypertension   . Hypercholesteremia   . CAD (coronary artery disease)   . Murmur   . Atrial fibrillation   . Diverticulosis   . Hypothyroidism   . Paroxysmal atrial fibrillation     Current Outpatient Prescriptions  Medication Sig Dispense Refill  . acidophilus (RISAQUAD) CAPS capsule Take 1 capsule by mouth daily.      Marland Kitchen aspirin 81 MG tablet Take 81 mg by mouth daily. Takes two each day.      Marland Kitchen atorvastatin (LIPITOR) 20 MG tablet Take 20 mg by mouth daily.      . cholecalciferol (VITAMIN D) 1000 UNITS tablet Take 1,000 Units by mouth daily.      Marland Kitchen levothyroxine (SYNTHROID, LEVOTHROID) 25 MCG tablet Take 25 mcg by mouth daily before breakfast.      . Multiple Vitamin (MULTIVITAMIN WITH MINERALS) TABS tablet Take 1 tablet by mouth daily.      Marland Kitchen telmisartan-hydrochlorothiazide (MICARDIS HCT) 80-12.5 MG per tablet Take 1 tablet by mouth daily. Take 40 mg  telmisartan and 6.25 hydrochlorothiazide.      Marland Kitchen zolpidem (AMBIEN) 10 MG tablet Take 5 mg by mouth at bedtime as needed for sleep.       No current facility-administered medications for this visit.    Allergies:    Allergies  Allergen Reactions  . Shellfish Allergy Swelling and Rash    Social History:  The patient  reports that he has quit smoking. He does not have any smokeless tobacco history on file. He reports that he drinks alcohol. He reports that he does not use illicit drugs.   ROS:  Please see the history of present illness.   Appetite is stable. No peripheral edema. No transient neurological symptoms.   All other systems reviewed and negative.   OBJECTIVE: VS:  BP 142/58  Pulse 58  Ht 5\' 7"  (1.702 m)  Wt 169 lb (76.658 kg)  BMI 26.46 kg/m2 Well nourished, well developed, in no acute distress, elderly HEENT: normal Neck: JVD flat. Carotid bruit absent  Cardiac:  normal S1, S2; RRR; 2-3 of 6 crescendo decrescendo systolic murmur of aortic stenosis Lungs:  clear to auscultation bilaterally, no wheezing, rhonchi or rales Abd: soft, nontender, no hepatomegaly Ext: Edema absent. Pulses 1+ bilateral Skin: warm and dry Neuro:  CNs 2-12 intact, no focal abnormalities noted  EKG:  Normal sinus rhythm with lateral T-wave abnormality.  Signed, Illene Labrador III, MD 02/26/2014 2:04 PM

## 2014-02-26 NOTE — Telephone Encounter (Signed)
New Message  Pt wife called. Requests a call back to make a correction in his medication. ( No further details) please call

## 2014-03-06 DIAGNOSIS — D485 Neoplasm of uncertain behavior of skin: Secondary | ICD-10-CM | POA: Diagnosis not present

## 2014-03-06 DIAGNOSIS — C4441 Basal cell carcinoma of skin of scalp and neck: Secondary | ICD-10-CM | POA: Diagnosis not present

## 2014-03-06 DIAGNOSIS — L570 Actinic keratosis: Secondary | ICD-10-CM | POA: Diagnosis not present

## 2014-03-06 DIAGNOSIS — D044 Carcinoma in situ of skin of scalp and neck: Secondary | ICD-10-CM | POA: Diagnosis not present

## 2014-03-13 DIAGNOSIS — Z23 Encounter for immunization: Secondary | ICD-10-CM | POA: Diagnosis not present

## 2014-06-08 ENCOUNTER — Other Ambulatory Visit: Payer: Self-pay | Admitting: Interventional Cardiology

## 2014-06-11 ENCOUNTER — Telehealth: Payer: Self-pay

## 2014-06-11 ENCOUNTER — Other Ambulatory Visit: Payer: Self-pay | Admitting: Interventional Cardiology

## 2014-06-11 NOTE — Telephone Encounter (Signed)
Dr Tamala Julian I  Refilled patient's micardis -HCTZ per Dr Radford Pax. Patient's wife called states that the patient's ankles was swelling and he had been out of his meds for a week. If this ok just close the note. Thanks Kalman Shan

## 2014-08-26 DIAGNOSIS — Z125 Encounter for screening for malignant neoplasm of prostate: Secondary | ICD-10-CM | POA: Diagnosis not present

## 2014-08-26 DIAGNOSIS — I10 Essential (primary) hypertension: Secondary | ICD-10-CM | POA: Diagnosis not present

## 2014-08-26 DIAGNOSIS — R7301 Impaired fasting glucose: Secondary | ICD-10-CM | POA: Diagnosis not present

## 2014-08-26 DIAGNOSIS — I2510 Atherosclerotic heart disease of native coronary artery without angina pectoris: Secondary | ICD-10-CM | POA: Diagnosis not present

## 2014-08-26 DIAGNOSIS — E039 Hypothyroidism, unspecified: Secondary | ICD-10-CM | POA: Diagnosis not present

## 2014-09-02 DIAGNOSIS — Z6826 Body mass index (BMI) 26.0-26.9, adult: Secondary | ICD-10-CM | POA: Diagnosis not present

## 2014-09-02 DIAGNOSIS — R609 Edema, unspecified: Secondary | ICD-10-CM | POA: Diagnosis not present

## 2014-09-02 DIAGNOSIS — H9190 Unspecified hearing loss, unspecified ear: Secondary | ICD-10-CM | POA: Diagnosis not present

## 2014-09-02 DIAGNOSIS — Z0000 Encounter for general adult medical examination without abnormal findings: Secondary | ICD-10-CM | POA: Diagnosis not present

## 2014-09-02 DIAGNOSIS — I2510 Atherosclerotic heart disease of native coronary artery without angina pectoris: Secondary | ICD-10-CM | POA: Diagnosis not present

## 2014-09-02 DIAGNOSIS — E871 Hypo-osmolality and hyponatremia: Secondary | ICD-10-CM | POA: Diagnosis not present

## 2014-09-02 DIAGNOSIS — J309 Allergic rhinitis, unspecified: Secondary | ICD-10-CM | POA: Diagnosis not present

## 2014-09-02 DIAGNOSIS — I10 Essential (primary) hypertension: Secondary | ICD-10-CM | POA: Diagnosis not present

## 2014-09-02 DIAGNOSIS — R7301 Impaired fasting glucose: Secondary | ICD-10-CM | POA: Diagnosis not present

## 2014-09-02 DIAGNOSIS — E039 Hypothyroidism, unspecified: Secondary | ICD-10-CM | POA: Diagnosis not present

## 2014-09-02 DIAGNOSIS — N182 Chronic kidney disease, stage 2 (mild): Secondary | ICD-10-CM | POA: Diagnosis not present

## 2014-09-02 DIAGNOSIS — Z1389 Encounter for screening for other disorder: Secondary | ICD-10-CM | POA: Diagnosis not present

## 2014-09-05 ENCOUNTER — Encounter: Payer: Self-pay | Admitting: Interventional Cardiology

## 2014-09-11 IMAGING — CT CT CERVICAL SPINE W/O CM
4 of 6 series · 13 of 33 positions shown, 15 images · non-contrast
Comparison: None.

CLINICAL DATA: Status post fall

EXAM:
CT HEAD WITHOUT CONTRAST
CT CERVICAL SPINE WITHOUT CONTRAST
TECHNIQUE: Multidetector CT imaging of the head and cervical spine was
performed following the standard protocol without intravenous
contrast. Multiplanar CT image reconstructions of the cervical spine
were also generated.

[Series 4: c-spine st · axial · 0.34mm/px · z∈[-343,-247]mm · 3 of 97 slices shown, 4 images]
[im 25/97  soft-tissue]
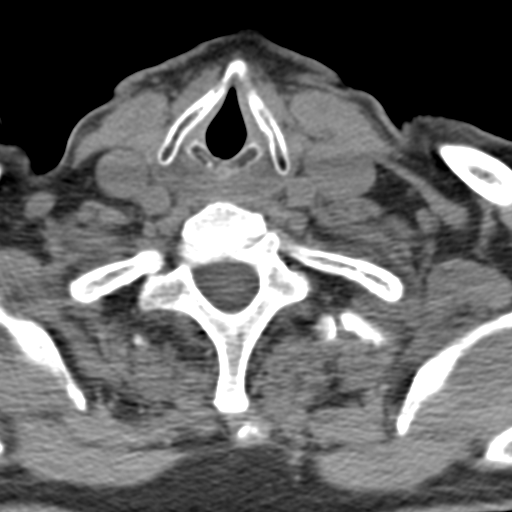
[im 25/97  bone]
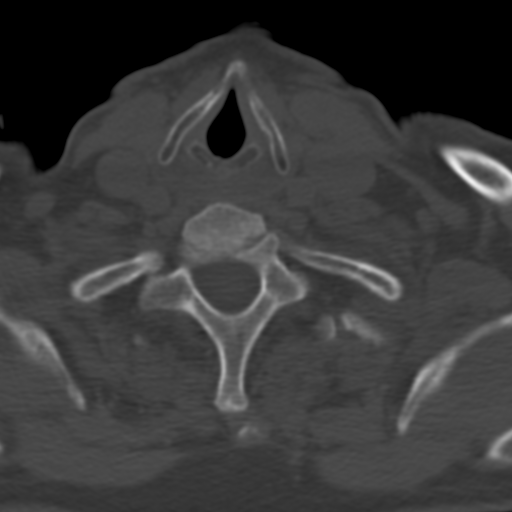
[im 49/97  bone]
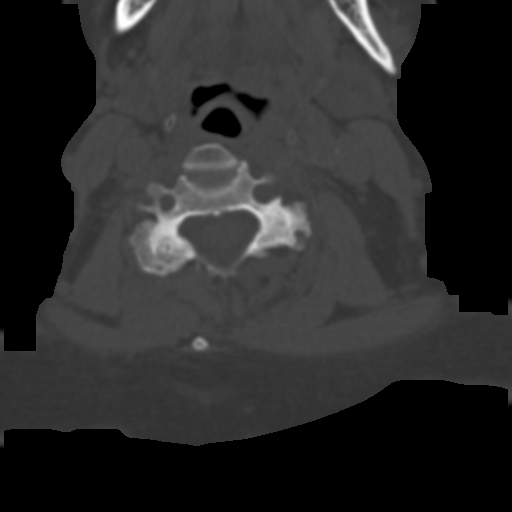
[im 73/97  bone]
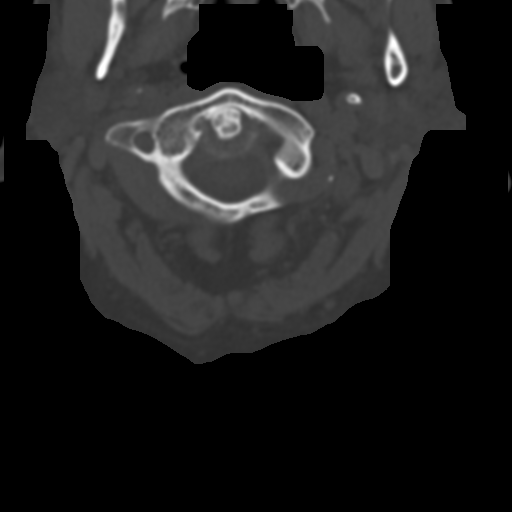

[Series 7: coronal · coronal · 0.28mm/px · 3 of 48 slices shown]
[im 10/48  bone]
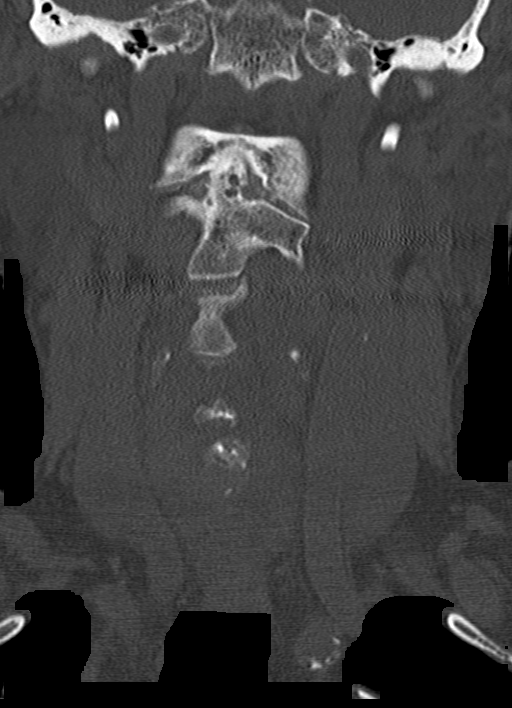
[im 19/48  bone]
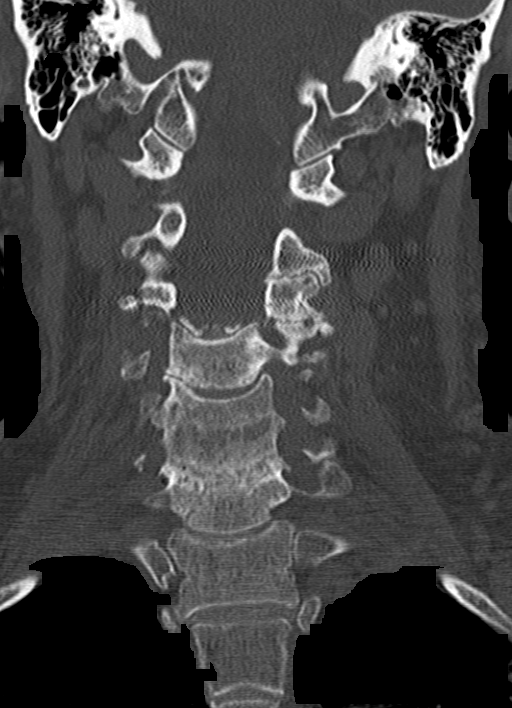
[im 29/48  bone]
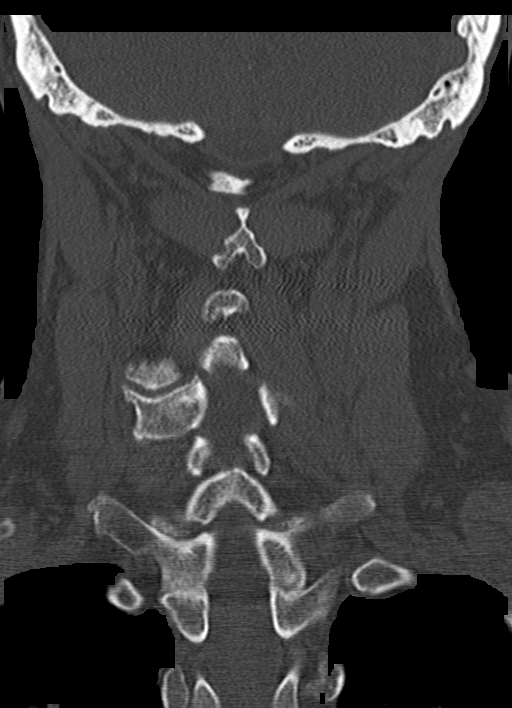

[Series 8: sagittal · sagittal · 0.28mm/px · 5 of 48 slices shown, 6 images]
[im 16/48  bone]
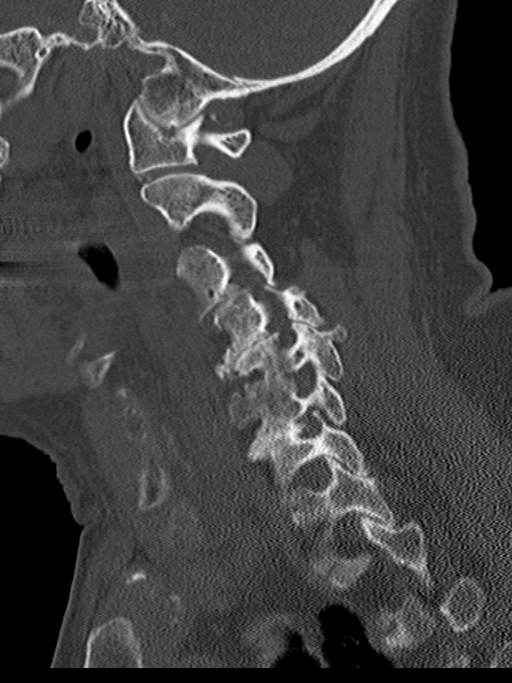
[im 20/48  bone]
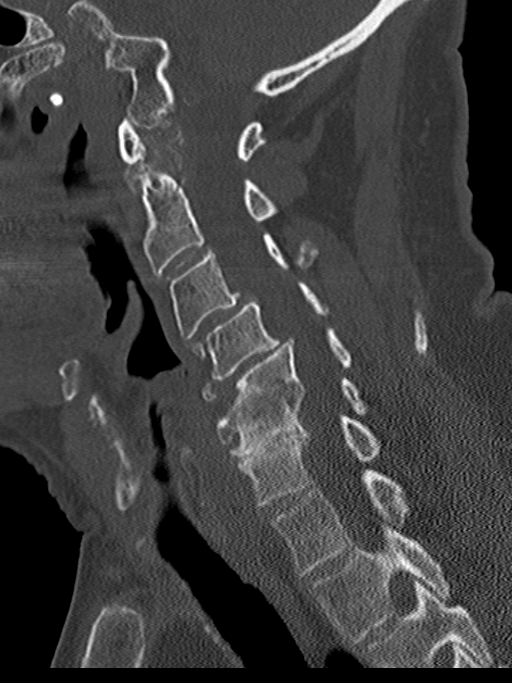
[im 24/48  soft-tissue]
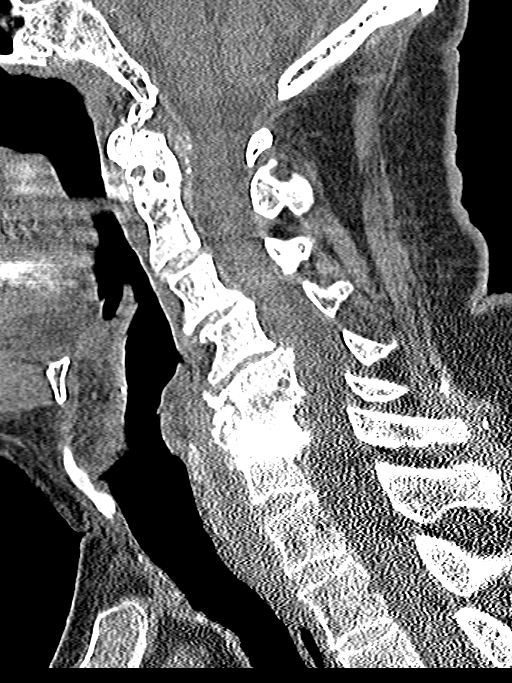
[im 24/48  bone]
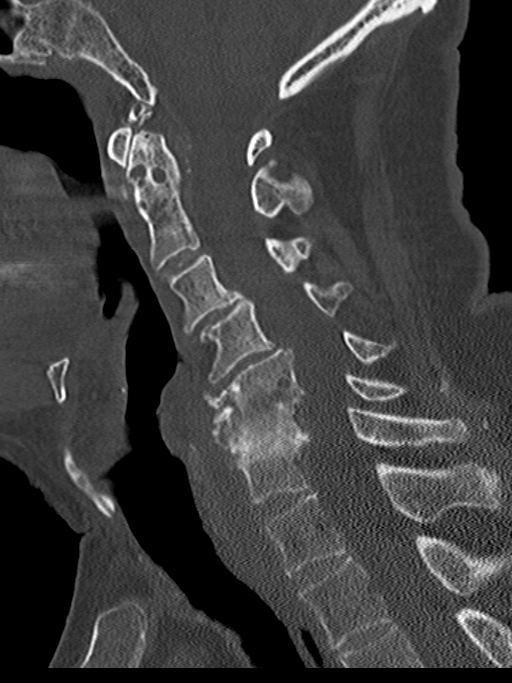
[im 28/48  bone]
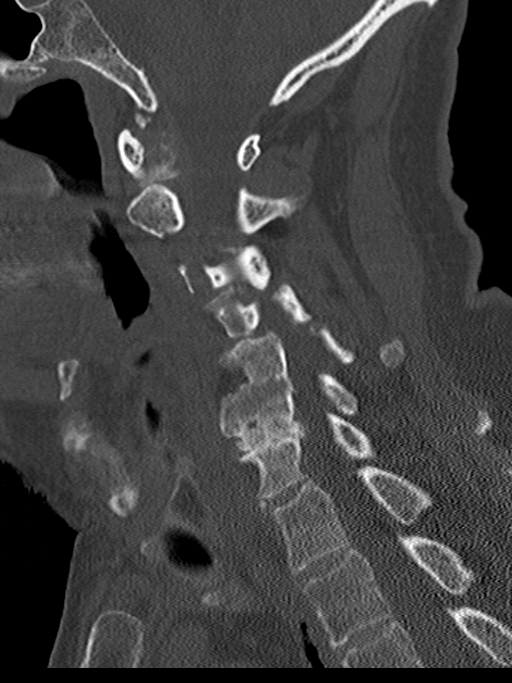
[im 32/48  bone]
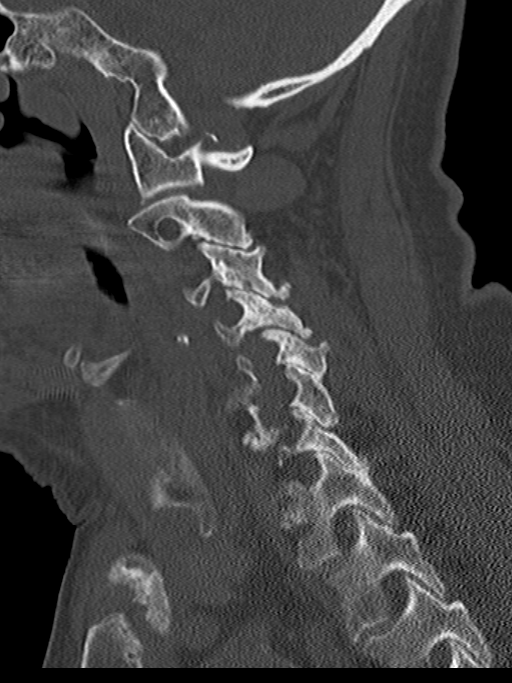

[Series 9: axial recon · axial · 0.24mm/px · z∈[-364,-319]mm · 2 of 97 slices shown]
[im 25/97  bone]
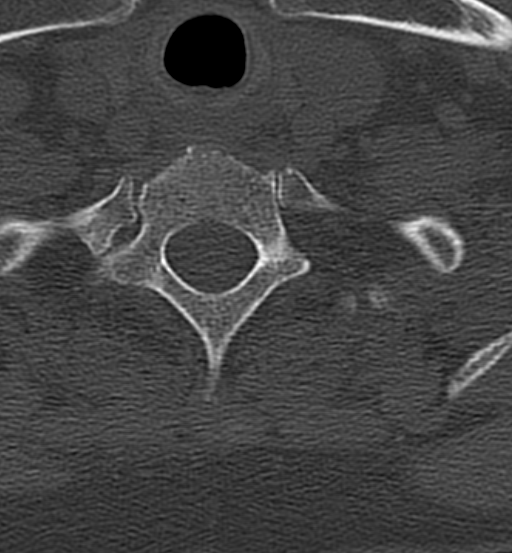
[im 49/97  bone]
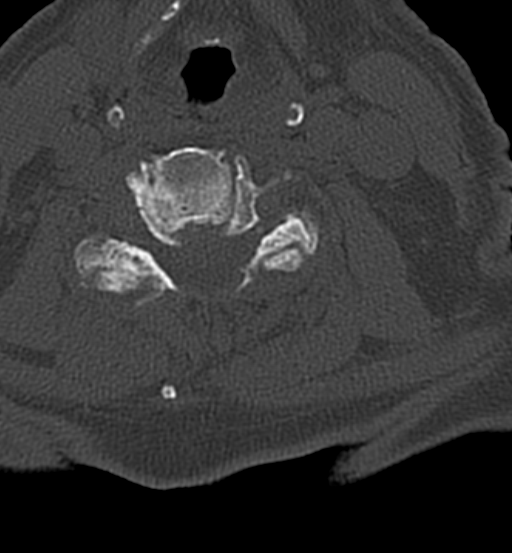

[13 of 33 positions shown; findings below may reference images not displayed]

FINDINGS: CT HEAD FINDINGS

There is no evidence of mass effect, midline shift, or extra-axial
fluid collections. There is no evidence of a space-occupying lesion
or intracranial hemorrhage. There is no evidence of a cortical-based
area of acute infarction. There is generalized cerebral atrophy.
There is periventricular white matter low attenuation likely
secondary to microangiopathy.

The ventricles and sulci are appropriate for the patient's age. The
basal cisterns are patent.

Visualized portions of the orbits are unremarkable. The visualized
portions of the paranasal sinuses and mastoid air cells are
unremarkable. Cerebrovascular atherosclerotic calcifications are
noted.

The osseous structures are unremarkable. Left parietal scalp
contusion.

CT CERVICAL SPINE FINDINGS

The alignment is anatomic. The vertebral body heights are
maintained. There is no acute fracture. The prevertebral soft
tissues are normal. The intraspinal soft tissues are not fully
imaged on this examination due to poor soft tissue contrast, but
there is no gross soft tissue abnormality.

There is degenerative disc disease of the cervical spine. There is 4
mm of anterolisthesis of C3 on C4 secondary to severe bilateral
facet arthropathy. There is severe bilateral foraminal stenosis at
C3-4. There is a broad-based disc bulge at C3-4. There is 3 mm of
anterolisthesis of C4 on C5 secondary to severe bilateral facet
arthropathy. There are bilateral uncovertebral degenerative changes
at C4-5 resulting in foraminal encroachment.

There is osseous fusion across the C5-C6 disc space. There is severe
degenerative disc disease at C6-7. There are broad-based disc
osteophyte complexes at C5-6 and C6-7. There is severe facet
arthropathy bilaterally at C5-6.

There is a spiculated airspace opacity measuring approximately 2 cm
in the left upper lobe.There are degenerative changes of bilateral
temporomandibular joints.
IMPRESSION: 1. No acute intracranial pathology.
2. No acute osseous injury of the cervical spine.
3. Cervical spine spondylosis as described above.
4. There is a 2 cm spiculated left apical airspace opacity of
uncertain etiology and incompletely visualized. Recommend a
dedicated nonemergent CT of the chest for further evaluation.

## 2014-12-02 DIAGNOSIS — C4441 Basal cell carcinoma of skin of scalp and neck: Secondary | ICD-10-CM | POA: Diagnosis not present

## 2014-12-02 DIAGNOSIS — Z85828 Personal history of other malignant neoplasm of skin: Secondary | ICD-10-CM | POA: Diagnosis not present

## 2014-12-02 DIAGNOSIS — L570 Actinic keratosis: Secondary | ICD-10-CM | POA: Diagnosis not present

## 2014-12-02 DIAGNOSIS — D485 Neoplasm of uncertain behavior of skin: Secondary | ICD-10-CM | POA: Diagnosis not present

## 2015-01-23 DIAGNOSIS — H01001 Unspecified blepharitis right upper eyelid: Secondary | ICD-10-CM | POA: Diagnosis not present

## 2015-01-23 DIAGNOSIS — H01005 Unspecified blepharitis left lower eyelid: Secondary | ICD-10-CM | POA: Diagnosis not present

## 2015-01-23 DIAGNOSIS — H01002 Unspecified blepharitis right lower eyelid: Secondary | ICD-10-CM | POA: Diagnosis not present

## 2015-01-23 DIAGNOSIS — H26492 Other secondary cataract, left eye: Secondary | ICD-10-CM | POA: Diagnosis not present

## 2015-01-23 DIAGNOSIS — Z961 Presence of intraocular lens: Secondary | ICD-10-CM | POA: Diagnosis not present

## 2015-01-23 DIAGNOSIS — H3531 Nonexudative age-related macular degeneration: Secondary | ICD-10-CM | POA: Diagnosis not present

## 2015-01-23 DIAGNOSIS — H01004 Unspecified blepharitis left upper eyelid: Secondary | ICD-10-CM | POA: Diagnosis not present

## 2015-03-06 DIAGNOSIS — R7301 Impaired fasting glucose: Secondary | ICD-10-CM | POA: Diagnosis not present

## 2015-03-06 DIAGNOSIS — Z6826 Body mass index (BMI) 26.0-26.9, adult: Secondary | ICD-10-CM | POA: Diagnosis not present

## 2015-03-06 DIAGNOSIS — I2510 Atherosclerotic heart disease of native coronary artery without angina pectoris: Secondary | ICD-10-CM | POA: Diagnosis not present

## 2015-03-06 DIAGNOSIS — R609 Edema, unspecified: Secondary | ICD-10-CM | POA: Diagnosis not present

## 2015-03-06 DIAGNOSIS — I10 Essential (primary) hypertension: Secondary | ICD-10-CM | POA: Diagnosis not present

## 2015-03-06 DIAGNOSIS — Z23 Encounter for immunization: Secondary | ICD-10-CM | POA: Diagnosis not present

## 2015-03-06 DIAGNOSIS — E039 Hypothyroidism, unspecified: Secondary | ICD-10-CM | POA: Diagnosis not present

## 2015-03-08 ENCOUNTER — Other Ambulatory Visit: Payer: Self-pay | Admitting: Interventional Cardiology

## 2015-03-18 ENCOUNTER — Ambulatory Visit (INDEPENDENT_AMBULATORY_CARE_PROVIDER_SITE_OTHER): Payer: Medicare Other | Admitting: Interventional Cardiology

## 2015-03-18 ENCOUNTER — Encounter: Payer: Self-pay | Admitting: Interventional Cardiology

## 2015-03-18 VITALS — BP 144/70 | HR 54 | Ht 69.0 in | Wt 174.4 lb

## 2015-03-18 DIAGNOSIS — I4892 Unspecified atrial flutter: Secondary | ICD-10-CM

## 2015-03-18 DIAGNOSIS — I10 Essential (primary) hypertension: Secondary | ICD-10-CM | POA: Diagnosis not present

## 2015-03-18 DIAGNOSIS — I350 Nonrheumatic aortic (valve) stenosis: Secondary | ICD-10-CM | POA: Diagnosis not present

## 2015-03-18 DIAGNOSIS — I25810 Atherosclerosis of coronary artery bypass graft(s) without angina pectoris: Secondary | ICD-10-CM | POA: Diagnosis not present

## 2015-03-18 DIAGNOSIS — E7800 Pure hypercholesterolemia, unspecified: Secondary | ICD-10-CM

## 2015-03-18 MED ORDER — TELMISARTAN-HCTZ 80-12.5 MG PO TABS: 0.5000 | ORAL_TABLET | Freq: Every day | ORAL | Status: DC

## 2015-03-18 NOTE — Patient Instructions (Signed)
Medication Instructions:  Your physician recommends that you continue on your current medications as directed. Please refer to the Current Medication list given to you today.   Labwork: None orderd  Testing/Procedures: Your physician has requested that you have an echocardiogram. Echocardiography is a painless test that uses sound waves to create images of your heart. It provides your doctor with information about the size and shape of your heart and how well your heart's chambers and valves are working. This procedure takes approximately one hour. There are no restrictions for this procedure.   Follow-Up: Your physician wants you to follow-up in: 1 year with Dr.Smith You will receive a reminder letter in the mail two months in advance. If you don't receive a letter, please call our office to schedule the follow-up appointment.   Any Other Special Instructions Will Be Listed Below (If Applicable). Please purchase a wear medium tension knee compression stocking daily

## 2015-03-18 NOTE — Progress Notes (Signed)
Cardiology Office Note   Date:  03/18/2015   ID:  Billy Barnes, DOB [birth year], MRN [id number]  PCP:  Jerlyn Ly, MD  Cardiologist:  Sinclair Grooms, MD   Chief Complaint  Patient presents with  . Coronary Artery Disease  . Cardiac Valve Problem    AS      History of Present Illness: Billy Barnes is a 81 y.o. male who presents for  Coronary disease with prior bypass, aortic stenosis, history of paroxysmal atrial fibrillation, essential hypertension, and hyperlipidemia.   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 He lives a Friend's home. He still exercises 3 times per week on an elliptical device. He has occasional dizziness post exercise. He has some difficulty with ambulating due to balance. He denies orthopnea , PND, angina, syncope, and palpitations.    Past Medical History  Diagnosis Date  . Hypertension   . Hypercholesteremia   . CAD (coronary artery disease)   . Murmur   . Atrial fibrillation ([place])   . Diverticulosis   . Hypothyroidism   . Paroxysmal atrial fibrillation [facility])     Past Surgical History  Procedure Laterality Date  . Bypass graft    . Hernia repair    . Hip surgery    . Hemorrhoid surgery       Current Outpatient Prescriptions  Medication Sig Dispense Refill  . acidophilus (RISAQUAD) CAPS capsule Take 1 capsule by mouth daily.    Marland Kitchen aspirin 81 MG tablet Take 81 mg by mouth daily.     Marland Kitchen atorvastatin (LIPITOR) 20 MG tablet Take 20 mg by mouth daily.    . cholecalciferol (VITAMIN D) 1000 UNITS tablet Take 1,000 Units by mouth daily.    Marland Kitchen levothyroxine (SYNTHROID, LEVOTHROID) 25 MCG tablet Take 25 mcg by mouth daily before breakfast.    . Multiple Vitamin (MULTIVITAMIN WITH MINERALS) TABS tablet Take 1 tablet by mouth daily.    Marland Kitchen telmisartan-hydrochlorothiazide (MICARDIS HCT) 80-12.5 MG tablet Take one half tablet (40-6.25 mg total) by mouth daily.    Marland Kitchen zolpidem (AMBIEN) 10 MG tablet Take 5 mg by mouth at bedtime as needed for sleep.     No current  facility-administered medications for this visit.    Allergies:   Shellfish allergy    Social History:  The patient  reports that he has quit smoking. He has never used smokeless tobacco. He reports that he drinks alcohol. He reports that he does not use illicit drugs.   Family History:  The patient's family history includes Heart disease in his mother.    ROS:  Please see the history of present illness.   Otherwise, review of systems are positive for  Balance difficulty but otherwise unremarkable.   All other systems are reviewed and negative.    PHYSICAL EXAM: VS:  BP 144/70 mmHg  Pulse 54  Ht 5\' 9"  (1.753 m)  Wt 79.107 kg (174 lb 6.4 oz)  BMI 25.74 kg/m2 , BMI Body mass index is 25.74 kg/(m^2). GEN: Well nourished, well developed, in no acute distress HEENT: normal Neck: no JVD, but there are bilateral transmittedcarotid bruits. There is no mass. Cardiac: RRR.  There  is  A 4/6 crescendo decrescendo systolic murmur of aortic stenosis;  There is no rub, or gallop. There is  no edema. Respiratory:  clear to auscultation bilaterally, normal work of breathing. GI: soft, nontender, nondistended, + BS MS: no deformity or atrophy Skin: warm and dry, no rash Neuro:  Strength and sensation are intact  Psych: euthymic mood, full affect   EKG:  EKG  is ordered today. The ekg reveals  Sinus bradycardia at 54 bpm with prominent voltage. First degree AV block is also noted. Occasional PACs are noted.   Recent Labs: No results found for requested labs within last 365 days.    Lipid Panel No results found for: CHOL, TRIG, HDL, CHOLHDL, VLDL, LDLCALC, LDLDIRECT    Wt Readings from Last 3 Encounters:  03/18/15 79.107 kg (174 lb 6.4 oz)  02/26/14 76.658 kg (169 lb)      Other studies Reviewed: Additional studies/ records that were reviewed today include:  No recent echo or cardiac studies.    ASSESSMENT AND PLAN:  1. Coronary artery disease involving coronary bypass graft of  native heart without angina pectoris  Denies angina  2. Aortic stenosis  Probably clinically significant based upon exam  3. Essential hypertension  Controlled  4. Atrial flutter, unspecified  No clinical recurrence  5. Hypercholesteremia  On therapy and followed by Dr. Joylene Draft  6. Bilateral lower extremity edema Related to venous insufficiency. No clinical evidence of volume overload or pulmonary congestion   Current medicines are reviewed at length with the patient today.  The patient has the following concerns regarding medicines:  No change.  The following changes/actions have been instituted:     knee-high moderate tension support stockings   2-D Doppler echocardiogram to assess left ear   One-year clinical follow-up assuming absence of symptoms. Depending upon the severity of left ear, we may cautioned against excessive physical activity.  Labs/ tests ordered today include:  No orders of the defined types were placed in this encounter.     Disposition:   FU with HS in 1 years  Signed, Sinclair Grooms, MD  03/18/2015 10:40 AM    [facility] [address], [place], [place]  [address] Phone: [phone]; Fax: [phone]

## 2015-03-28 ENCOUNTER — Ambulatory Visit (HOSPITAL_COMMUNITY): Payer: Medicare Other | Attending: Internal Medicine

## 2015-03-28 ENCOUNTER — Other Ambulatory Visit: Payer: Self-pay

## 2015-03-28 DIAGNOSIS — E7800 Pure hypercholesterolemia, unspecified: Secondary | ICD-10-CM | POA: Diagnosis not present

## 2015-03-28 DIAGNOSIS — I10 Essential (primary) hypertension: Secondary | ICD-10-CM | POA: Insufficient documentation

## 2015-03-28 DIAGNOSIS — I350 Nonrheumatic aortic (valve) stenosis: Secondary | ICD-10-CM | POA: Diagnosis not present

## 2015-03-28 DIAGNOSIS — I358 Other nonrheumatic aortic valve disorders: Secondary | ICD-10-CM | POA: Diagnosis not present

## 2015-03-28 DIAGNOSIS — I351 Nonrheumatic aortic (valve) insufficiency: Secondary | ICD-10-CM | POA: Insufficient documentation

## 2015-03-28 DIAGNOSIS — I517 Cardiomegaly: Secondary | ICD-10-CM | POA: Diagnosis not present

## 2015-04-01 ENCOUNTER — Telehealth: Payer: Self-pay

## 2015-04-01 NOTE — Telephone Encounter (Signed)
Called to give pt echo results.lmtcb 

## 2015-04-01 NOTE — Telephone Encounter (Signed)
Pt aware of echo results with verbal understanding. Moderate Aortic stenosis. Not a severe problem. Requires only intermittent f/u at his age.

## 2015-04-01 NOTE — Telephone Encounter (Signed)
F/u    Pt returning your call back.

## 2015-04-01 NOTE — Telephone Encounter (Signed)
-----   Message from Belva Crome, MD sent at 03/30/2015  4:42 PM EDT ----- Moderate Aortic stenosis. Not a severe problem. Requires only intermittent f/u at his age.

## 2015-06-02 DIAGNOSIS — Z85828 Personal history of other malignant neoplasm of skin: Secondary | ICD-10-CM | POA: Diagnosis not present

## 2015-06-02 DIAGNOSIS — L821 Other seborrheic keratosis: Secondary | ICD-10-CM | POA: Diagnosis not present

## 2015-06-02 DIAGNOSIS — D1801 Hemangioma of skin and subcutaneous tissue: Secondary | ICD-10-CM | POA: Diagnosis not present

## 2015-06-02 DIAGNOSIS — D044 Carcinoma in situ of skin of scalp and neck: Secondary | ICD-10-CM | POA: Diagnosis not present

## 2015-06-02 DIAGNOSIS — C44329 Squamous cell carcinoma of skin of other parts of face: Secondary | ICD-10-CM | POA: Diagnosis not present

## 2015-06-02 DIAGNOSIS — D485 Neoplasm of uncertain behavior of skin: Secondary | ICD-10-CM | POA: Diagnosis not present

## 2015-06-02 DIAGNOSIS — L570 Actinic keratosis: Secondary | ICD-10-CM | POA: Diagnosis not present

## 2015-10-10 DIAGNOSIS — M5136 Other intervertebral disc degeneration, lumbar region: Secondary | ICD-10-CM | POA: Diagnosis not present

## 2015-10-10 DIAGNOSIS — Z96641 Presence of right artificial hip joint: Secondary | ICD-10-CM | POA: Diagnosis not present

## 2015-10-10 DIAGNOSIS — Z471 Aftercare following joint replacement surgery: Secondary | ICD-10-CM | POA: Diagnosis not present

## 2015-10-14 DIAGNOSIS — M545 Low back pain: Secondary | ICD-10-CM | POA: Diagnosis not present

## 2015-10-14 DIAGNOSIS — R2681 Unsteadiness on feet: Secondary | ICD-10-CM | POA: Diagnosis not present

## 2015-10-14 DIAGNOSIS — M5186 Other intervertebral disc disorders, lumbar region: Secondary | ICD-10-CM | POA: Diagnosis not present

## 2015-10-14 DIAGNOSIS — M5416 Radiculopathy, lumbar region: Secondary | ICD-10-CM | POA: Diagnosis not present

## 2015-10-14 DIAGNOSIS — M6281 Muscle weakness (generalized): Secondary | ICD-10-CM | POA: Diagnosis not present

## 2015-10-15 DIAGNOSIS — M6281 Muscle weakness (generalized): Secondary | ICD-10-CM | POA: Diagnosis not present

## 2015-10-15 DIAGNOSIS — M545 Low back pain: Secondary | ICD-10-CM | POA: Diagnosis not present

## 2015-10-15 DIAGNOSIS — M5416 Radiculopathy, lumbar region: Secondary | ICD-10-CM | POA: Diagnosis not present

## 2015-10-15 DIAGNOSIS — R2681 Unsteadiness on feet: Secondary | ICD-10-CM | POA: Diagnosis not present

## 2015-10-15 DIAGNOSIS — M5186 Other intervertebral disc disorders, lumbar region: Secondary | ICD-10-CM | POA: Diagnosis not present

## 2015-10-16 DIAGNOSIS — R2681 Unsteadiness on feet: Secondary | ICD-10-CM | POA: Diagnosis not present

## 2015-10-16 DIAGNOSIS — M5186 Other intervertebral disc disorders, lumbar region: Secondary | ICD-10-CM | POA: Diagnosis not present

## 2015-10-16 DIAGNOSIS — M6281 Muscle weakness (generalized): Secondary | ICD-10-CM | POA: Diagnosis not present

## 2015-10-16 DIAGNOSIS — M5416 Radiculopathy, lumbar region: Secondary | ICD-10-CM | POA: Diagnosis not present

## 2015-10-16 DIAGNOSIS — M545 Low back pain: Secondary | ICD-10-CM | POA: Diagnosis not present

## 2015-10-20 DIAGNOSIS — M5186 Other intervertebral disc disorders, lumbar region: Secondary | ICD-10-CM | POA: Diagnosis not present

## 2015-10-20 DIAGNOSIS — M6281 Muscle weakness (generalized): Secondary | ICD-10-CM | POA: Diagnosis not present

## 2015-10-20 DIAGNOSIS — M5416 Radiculopathy, lumbar region: Secondary | ICD-10-CM | POA: Diagnosis not present

## 2015-10-20 DIAGNOSIS — R2681 Unsteadiness on feet: Secondary | ICD-10-CM | POA: Diagnosis not present

## 2015-10-20 DIAGNOSIS — M545 Low back pain: Secondary | ICD-10-CM | POA: Diagnosis not present

## 2015-10-21 DIAGNOSIS — M545 Low back pain: Secondary | ICD-10-CM | POA: Diagnosis not present

## 2015-10-21 DIAGNOSIS — M5416 Radiculopathy, lumbar region: Secondary | ICD-10-CM | POA: Diagnosis not present

## 2015-10-21 DIAGNOSIS — M5186 Other intervertebral disc disorders, lumbar region: Secondary | ICD-10-CM | POA: Diagnosis not present

## 2015-10-21 DIAGNOSIS — M6281 Muscle weakness (generalized): Secondary | ICD-10-CM | POA: Diagnosis not present

## 2015-10-21 DIAGNOSIS — R2681 Unsteadiness on feet: Secondary | ICD-10-CM | POA: Diagnosis not present

## 2015-10-23 DIAGNOSIS — M5186 Other intervertebral disc disorders, lumbar region: Secondary | ICD-10-CM | POA: Diagnosis not present

## 2015-10-23 DIAGNOSIS — M5416 Radiculopathy, lumbar region: Secondary | ICD-10-CM | POA: Diagnosis not present

## 2015-10-23 DIAGNOSIS — R2681 Unsteadiness on feet: Secondary | ICD-10-CM | POA: Diagnosis not present

## 2015-10-23 DIAGNOSIS — M6281 Muscle weakness (generalized): Secondary | ICD-10-CM | POA: Diagnosis not present

## 2015-10-23 DIAGNOSIS — M545 Low back pain: Secondary | ICD-10-CM | POA: Diagnosis not present

## 2015-10-28 DIAGNOSIS — Z125 Encounter for screening for malignant neoplasm of prostate: Secondary | ICD-10-CM | POA: Diagnosis not present

## 2015-10-28 DIAGNOSIS — R7301 Impaired fasting glucose: Secondary | ICD-10-CM | POA: Diagnosis not present

## 2015-10-28 DIAGNOSIS — I10 Essential (primary) hypertension: Secondary | ICD-10-CM | POA: Diagnosis not present

## 2015-10-28 DIAGNOSIS — E038 Other specified hypothyroidism: Secondary | ICD-10-CM | POA: Diagnosis not present

## 2015-10-28 DIAGNOSIS — N182 Chronic kidney disease, stage 2 (mild): Secondary | ICD-10-CM | POA: Diagnosis not present

## 2015-11-03 DIAGNOSIS — H9193 Unspecified hearing loss, bilateral: Secondary | ICD-10-CM | POA: Diagnosis not present

## 2015-11-03 DIAGNOSIS — E038 Other specified hypothyroidism: Secondary | ICD-10-CM | POA: Diagnosis not present

## 2015-11-03 DIAGNOSIS — N182 Chronic kidney disease, stage 2 (mild): Secondary | ICD-10-CM | POA: Diagnosis not present

## 2015-11-03 DIAGNOSIS — R600 Localized edema: Secondary | ICD-10-CM | POA: Diagnosis not present

## 2015-11-03 DIAGNOSIS — Z1389 Encounter for screening for other disorder: Secondary | ICD-10-CM | POA: Diagnosis not present

## 2015-11-03 DIAGNOSIS — R011 Cardiac murmur, unspecified: Secondary | ICD-10-CM | POA: Diagnosis not present

## 2015-11-03 DIAGNOSIS — R7301 Impaired fasting glucose: Secondary | ICD-10-CM | POA: Diagnosis not present

## 2015-11-03 DIAGNOSIS — Z6825 Body mass index (BMI) 25.0-25.9, adult: Secondary | ICD-10-CM | POA: Diagnosis not present

## 2015-11-03 DIAGNOSIS — E871 Hypo-osmolality and hyponatremia: Secondary | ICD-10-CM | POA: Diagnosis not present

## 2015-11-03 DIAGNOSIS — Z0000 Encounter for general adult medical examination without abnormal findings: Secondary | ICD-10-CM | POA: Diagnosis not present

## 2015-11-03 DIAGNOSIS — I2510 Atherosclerotic heart disease of native coronary artery without angina pectoris: Secondary | ICD-10-CM | POA: Diagnosis not present

## 2015-11-03 DIAGNOSIS — M5489 Other dorsalgia: Secondary | ICD-10-CM | POA: Diagnosis not present

## 2015-11-13 DIAGNOSIS — Z471 Aftercare following joint replacement surgery: Secondary | ICD-10-CM | POA: Diagnosis not present

## 2015-11-13 DIAGNOSIS — Z96641 Presence of right artificial hip joint: Secondary | ICD-10-CM | POA: Diagnosis not present

## 2015-12-02 DIAGNOSIS — L814 Other melanin hyperpigmentation: Secondary | ICD-10-CM | POA: Diagnosis not present

## 2015-12-02 DIAGNOSIS — Z85828 Personal history of other malignant neoplasm of skin: Secondary | ICD-10-CM | POA: Diagnosis not present

## 2015-12-02 DIAGNOSIS — L821 Other seborrheic keratosis: Secondary | ICD-10-CM | POA: Diagnosis not present

## 2015-12-02 DIAGNOSIS — L570 Actinic keratosis: Secondary | ICD-10-CM | POA: Diagnosis not present

## 2016-02-12 DIAGNOSIS — H52223 Regular astigmatism, bilateral: Secondary | ICD-10-CM | POA: Diagnosis not present

## 2016-02-12 DIAGNOSIS — H01005 Unspecified blepharitis left lower eyelid: Secondary | ICD-10-CM | POA: Diagnosis not present

## 2016-02-12 DIAGNOSIS — H01002 Unspecified blepharitis right lower eyelid: Secondary | ICD-10-CM | POA: Diagnosis not present

## 2016-02-12 DIAGNOSIS — H01004 Unspecified blepharitis left upper eyelid: Secondary | ICD-10-CM | POA: Diagnosis not present

## 2016-02-12 DIAGNOSIS — H5213 Myopia, bilateral: Secondary | ICD-10-CM | POA: Diagnosis not present

## 2016-02-12 DIAGNOSIS — H01001 Unspecified blepharitis right upper eyelid: Secondary | ICD-10-CM | POA: Diagnosis not present

## 2016-02-12 DIAGNOSIS — H26492 Other secondary cataract, left eye: Secondary | ICD-10-CM | POA: Diagnosis not present

## 2016-02-12 DIAGNOSIS — H524 Presbyopia: Secondary | ICD-10-CM | POA: Diagnosis not present

## 2016-03-05 ENCOUNTER — Encounter: Payer: Self-pay | Admitting: Interventional Cardiology

## 2016-03-13 DIAGNOSIS — Z23 Encounter for immunization: Secondary | ICD-10-CM | POA: Diagnosis not present

## 2016-03-22 ENCOUNTER — Ambulatory Visit: Payer: Medicare Other | Admitting: Interventional Cardiology

## 2016-03-23 ENCOUNTER — Ambulatory Visit (INDEPENDENT_AMBULATORY_CARE_PROVIDER_SITE_OTHER): Payer: Medicare Other | Admitting: Interventional Cardiology

## 2016-03-23 ENCOUNTER — Encounter: Payer: Self-pay | Admitting: Interventional Cardiology

## 2016-03-23 VITALS — BP 160/68 | HR 58 | Ht 67.5 in | Wt 165.0 lb

## 2016-03-23 DIAGNOSIS — I2510 Atherosclerotic heart disease of native coronary artery without angina pectoris: Secondary | ICD-10-CM | POA: Diagnosis not present

## 2016-03-23 DIAGNOSIS — I480 Paroxysmal atrial fibrillation: Secondary | ICD-10-CM

## 2016-03-23 DIAGNOSIS — I10 Essential (primary) hypertension: Secondary | ICD-10-CM | POA: Diagnosis not present

## 2016-03-23 DIAGNOSIS — I350 Nonrheumatic aortic (valve) stenosis: Secondary | ICD-10-CM | POA: Diagnosis not present

## 2016-03-23 NOTE — Patient Instructions (Signed)
Medication Instructions:  None  Labwork: None  Testing/Procedures: Your physician has requested that you have an echocardiogram about a month prior to your one year office visit with Dr. Tamala Julian next year. Echocardiography is a painless test that uses sound waves to create images of your heart. It provides your doctor with information about the size and shape of your heart and how well your heart's chambers and valves are working. This procedure takes approximately one hour. There are no restrictions for this procedure.    Follow-Up: Your physician wants you to follow-up in: 1 year with Dr. Tamala Julian. You will receive a reminder letter in the mail two months in advance. If you don't receive a letter, please call our office to schedule the follow-up appointment.   Any Other Special Instructions Will Be Listed Below (If Applicable).     If you need a refill on your cardiac medications before your next appointment, please call your pharmacy.

## 2016-03-23 NOTE — Progress Notes (Signed)
Cardiology Office Note    Date:  03/23/2016   ID:  Billy Barnes, DOB 1920-11-28, MRN [id number]  PCP:  Jerlyn Ly, MD  Cardiologist: Sinclair Grooms, MD   Chief Complaint  Patient presents with  . Coronary Artery Disease    History of Present Illness:  Billy Barnes is a 82 y.o. male who presents for Coronary disease with prior bypass, aortic stenosis, history of paroxysmal atrial fibrillation, essential hypertension, and hyperlipidemia.  He is doing well. He continues to go to the gym up to 3 times a week. Physical activity is anaerobic including machines and treadmill. He denies chest discomfort. He is careful not to exceed his target heart rate. He uses 100 bpm as his limit for exertion. He denies angina, syncope, and excessive dyspnea.  Past Medical History:  Diagnosis Date  . Atrial fibrillation ([place])   . CAD (coronary artery disease)   . Diverticulosis   . Hypercholesteremia   . Hypertension   . Hypothyroidism   . Murmur   . Paroxysmal atrial fibrillation [facility])     Past Surgical History:  Procedure Laterality Date  . BYPASS GRAFT    . HEMORRHOID SURGERY    . HERNIA REPAIR    . HIP SURGERY      Current Medications: Outpatient Medications Prior to Visit  Medication Sig Dispense Refill  . acidophilus (RISAQUAD) CAPS capsule Take 1 capsule by mouth daily.    Marland Kitchen aspirin 81 MG tablet Take 81 mg by mouth daily.     Marland Kitchen atorvastatin (LIPITOR) 20 MG tablet Take 20 mg by mouth daily.    . cholecalciferol (VITAMIN D) 1000 UNITS tablet Take 1,000 Units by mouth daily.    Marland Kitchen levothyroxine (SYNTHROID, LEVOTHROID) 25 MCG tablet Take 25 mcg by mouth daily before breakfast.    . Multiple Vitamin (MULTIVITAMIN WITH MINERALS) TABS tablet Take 1 tablet by mouth daily.    Marland Kitchen telmisartan-hydrochlorothiazide (MICARDIS HCT) 80-12.5 MG tablet Take 0.5 tablets by mouth daily. Take one half tablet (40-6.25 mg total) by mouth daily. 45 tablet 5  . zolpidem (AMBIEN) 10 MG tablet Take  5 mg by mouth at bedtime as needed for sleep.     No facility-administered medications prior to visit.      Allergies:   Shellfish allergy   Social History   Social History  . Marital status: Married    Spouse name: N/A  . Number of children: N/A  . Years of education: N/A   Social History Main Topics  . Smoking status: Former Research scientist (life sciences)  . Smokeless tobacco: Never Used  . Alcohol use 0.0 oz/week  . Drug use: No  . Sexual activity: No   Other Topics Concern  . None   Social History Narrative  . None     Family History:  The patient's family history includes Heart disease in his mother.   ROS:   Please see the history of present illness.    Back discomfort that is his most limiting problem.  All other systems reviewed and are negative.   PHYSICAL EXAM:   VS:  BP (!) 160/68   Pulse (!) 58   Ht 5' 7.5" (1.715 m)   Wt 165 lb (74.8 kg)   BMI 25.46 kg/m    GEN: Well nourished, well developed, in no acute distress  HEENT: normal  Neck: no JVD, carotid bruits, or masses Cardiac: IRR; 2-3/6 SEM c/w AS;No rub, or gallop,or edema . Respiratory:  clear to auscultation bilaterally, normal  work of breathing GI: soft, nontender, nondistended, + BS MS: no deformity or atrophy  Skin: warm and dry, no rash Neuro:  Alert and Oriented x 3, Strength and sensation are intact Psych: euthymic mood, full affect  Wt Readings from Last 3 Encounters:  03/23/16 165 lb (74.8 kg)  03/18/15 174 lb 6.4 oz (79.1 kg)  02/26/14 169 lb (76.7 kg)      Studies/Labs Reviewed:   EKG:  EKG  Normal sinus rhythm with PACs. First degree AV block measuring 220 ms. No specific ST abnormality.  Recent Labs: No results found for requested labs within last 8760 hours.   Lipid Panel No results found for: CHOL, TRIG, HDL, CHOLHDL, VLDL, LDLCALC, LDLDIRECT  Additional studies/ records that were reviewed today include:  Echocardiogram 03/28/2015: Study Conclusions  - Left ventricle: The cavity  size was normal. Wall thickness was   increased in a pattern of mild LVH. Systolic function was normal.   The estimated ejection fraction was in the range of 55% to 60%.   Doppler parameters are consistent with abnormal left ventricular   relaxation (grade 1 diastolic dysfunction). - Aortic valve: AV is thickened, caldified with restricted motion.   Peak and mean gradients through the valve are 37 and 25 mm Hg   respectively consistent with moderate AS. There was trivial   regurgitation. - Left atrium: The atrium was mildly dilated.   ASSESSMENT:    1. Paroxysmal atrial fibrillation (HCC)   2. Essential hypertension   3. Coronary artery disease involving native coronary artery of native heart without angina pectoris   4. Typical atrial flutter ([place])   5. Nonrheumatic aortic valve stenosis      PLAN:  In order of problems listed above:  1. No symptomatic episodes of atrial fibrillation based upon the patient's observation and clinical encounters with his other physicians. 2. Elevated systolic blood pressure in the 160 mmHg range. Our target should be 140/90 mmHg. Record some home values and she had them with [place]. If he is consistently above 140 mmHg, we may need to add an agent. I believe he is on the current dose of Micardis HCT because of renal impairment. 3. Stable cardiac-wise. No angina. No nitroglycerin use. 4. No clinical recurrences of atrial flutter. 5. Clinically stable. Murmur is consistent with aortic stenosis. Was moderate when last evaluated 2 years ago. We did discuss eventual management of critical aortic stenosis. At his age he would be willing to consider percutaneous procedure but not open heart surgery. We will do an echocardiogram prior to his next office visit in one year.    Medication Adjustments/Labs and Tests Ordered: Current medicines are reviewed at length with the patient today.  Concerns regarding medicines are outlined above.  Medication changes, Labs  and Tests ordered today are listed in the Patient Instructions below. There are no Patient Instructions on file for this visit.   Signed, Sinclair Grooms, MD  03/23/2016 12:20 PM    [facility] [place], [place], [place]  [address] Phone: [phone]; Fax: [phone]

## 2016-03-24 DIAGNOSIS — L570 Actinic keratosis: Secondary | ICD-10-CM | POA: Diagnosis not present

## 2016-03-24 DIAGNOSIS — L814 Other melanin hyperpigmentation: Secondary | ICD-10-CM | POA: Diagnosis not present

## 2016-03-24 DIAGNOSIS — L738 Other specified follicular disorders: Secondary | ICD-10-CM | POA: Diagnosis not present

## 2016-03-24 DIAGNOSIS — Z85828 Personal history of other malignant neoplasm of skin: Secondary | ICD-10-CM | POA: Diagnosis not present

## 2016-03-25 ENCOUNTER — Telehealth: Payer: Self-pay | Admitting: Cardiology

## 2016-03-25 NOTE — Telephone Encounter (Signed)
Pt's daughter called because pt's B/p has been high. Yesterday he saw Dr Tamala Julian and was told to monitor his B/P because it was elevated in the office. The pt's daughter says he has "been taking his pressure all day" and now its high > 99991111 systolic. I suggested he take 1/2 Micardis HCTZ and only check his B/P every other day and call us if his systolic is consistently > 160.  Kerin Ransom PA-C 03/25/2016 6:18 PM

## 2016-03-26 MED ORDER — AMLODIPINE BESYLATE 2.5 MG PO TABS: 2.5000 mg | ORAL_TABLET | Freq: Every day | ORAL | 6 refills | Status: DC

## 2016-03-26 NOTE — Telephone Encounter (Signed)
Follow Up:   Pt is patient of Dr Marisue Brooklyn talked to [place] yesterday. Pt's blood pressure right now is 188/77. She was told to call if it was greater than 160. Please call to advise.

## 2016-03-26 NOTE — Telephone Encounter (Signed)
Agree 

## 2016-03-26 NOTE — Telephone Encounter (Signed)
Reviewed with Cecilie Kicks, NP and pt should start Amlodipine 2.5 mg by mouth daily.  Check BP 2-3 hours after taking.  I spoke with pt's daughter and gave her this information.  Will send prescription to [facility].  I asked daughter to call us if BP remains elevated.

## 2016-03-26 NOTE — Addendum Note (Signed)
Addended by: Thompson Grayer on: 03/26/2016 04:48 PM   Modules accepted: Orders

## 2016-03-26 NOTE — Telephone Encounter (Signed)
Spoke with pt's daughter. She report pt took AM medication around 8:30 AM. An hour later BP was 197/77.  Last reading today is 188/77.  Heart rate around 50.  Daughter reports pt was not aware of BP being elevated until he saw Dr. Tamala Julian yesterday and had not been checking prior to office visit.  She reports systolic was above [id number] yesterday but does not have exact readings.  Pt is feeling OK except for feeling anxious about blood pressure.  Will forward to Dr. Tamala Julian for review /recommendations.

## 2016-04-21 DIAGNOSIS — L570 Actinic keratosis: Secondary | ICD-10-CM | POA: Diagnosis not present

## 2016-05-11 DIAGNOSIS — R7301 Impaired fasting glucose: Secondary | ICD-10-CM | POA: Diagnosis not present

## 2016-05-11 DIAGNOSIS — I10 Essential (primary) hypertension: Secondary | ICD-10-CM | POA: Diagnosis not present

## 2016-05-11 DIAGNOSIS — Z6824 Body mass index (BMI) 24.0-24.9, adult: Secondary | ICD-10-CM | POA: Diagnosis not present

## 2016-05-11 DIAGNOSIS — E038 Other specified hypothyroidism: Secondary | ICD-10-CM | POA: Diagnosis not present

## 2016-05-11 DIAGNOSIS — I2510 Atherosclerotic heart disease of native coronary artery without angina pectoris: Secondary | ICD-10-CM | POA: Diagnosis not present

## 2016-05-13 ENCOUNTER — Other Ambulatory Visit: Payer: Self-pay | Admitting: Interventional Cardiology

## 2016-06-01 DIAGNOSIS — L718 Other rosacea: Secondary | ICD-10-CM | POA: Diagnosis not present

## 2016-06-01 DIAGNOSIS — Z85828 Personal history of other malignant neoplasm of skin: Secondary | ICD-10-CM | POA: Diagnosis not present

## 2016-06-01 DIAGNOSIS — C44519 Basal cell carcinoma of skin of other part of trunk: Secondary | ICD-10-CM | POA: Diagnosis not present

## 2016-06-01 DIAGNOSIS — D485 Neoplasm of uncertain behavior of skin: Secondary | ICD-10-CM | POA: Diagnosis not present

## 2016-06-01 DIAGNOSIS — C44319 Basal cell carcinoma of skin of other parts of face: Secondary | ICD-10-CM | POA: Diagnosis not present

## 2016-06-01 DIAGNOSIS — L570 Actinic keratosis: Secondary | ICD-10-CM | POA: Diagnosis not present

## 2016-06-16 DIAGNOSIS — C44519 Basal cell carcinoma of skin of other part of trunk: Secondary | ICD-10-CM | POA: Diagnosis not present

## 2016-06-16 DIAGNOSIS — Z85828 Personal history of other malignant neoplasm of skin: Secondary | ICD-10-CM | POA: Diagnosis not present

## 2016-10-27 DIAGNOSIS — H26492 Other secondary cataract, left eye: Secondary | ICD-10-CM | POA: Diagnosis not present

## 2016-10-27 DIAGNOSIS — H01002 Unspecified blepharitis right lower eyelid: Secondary | ICD-10-CM | POA: Diagnosis not present

## 2016-10-27 DIAGNOSIS — H01004 Unspecified blepharitis left upper eyelid: Secondary | ICD-10-CM | POA: Diagnosis not present

## 2016-10-27 DIAGNOSIS — H01001 Unspecified blepharitis right upper eyelid: Secondary | ICD-10-CM | POA: Diagnosis not present

## 2016-10-27 DIAGNOSIS — H353131 Nonexudative age-related macular degeneration, bilateral, early dry stage: Secondary | ICD-10-CM | POA: Diagnosis not present

## 2016-11-11 DIAGNOSIS — Z6825 Body mass index (BMI) 25.0-25.9, adult: Secondary | ICD-10-CM | POA: Diagnosis not present

## 2016-11-11 DIAGNOSIS — I10 Essential (primary) hypertension: Secondary | ICD-10-CM | POA: Diagnosis not present

## 2016-11-11 DIAGNOSIS — R195 Other fecal abnormalities: Secondary | ICD-10-CM | POA: Diagnosis not present

## 2016-11-11 DIAGNOSIS — K921 Melena: Secondary | ICD-10-CM | POA: Diagnosis not present

## 2016-11-12 ENCOUNTER — Telehealth: Payer: Self-pay | Admitting: Interventional Cardiology

## 2016-11-12 DIAGNOSIS — I519 Heart disease, unspecified: Secondary | ICD-10-CM | POA: Diagnosis not present

## 2016-11-12 DIAGNOSIS — Z800 Family history of malignant neoplasm of digestive organs: Secondary | ICD-10-CM | POA: Diagnosis not present

## 2016-11-12 DIAGNOSIS — R195 Other fecal abnormalities: Secondary | ICD-10-CM | POA: Diagnosis not present

## 2016-11-12 DIAGNOSIS — K921 Melena: Secondary | ICD-10-CM | POA: Diagnosis not present

## 2016-11-12 NOTE — Telephone Encounter (Signed)
New Message  Pt voiced she is wanting to speak with MD-Smith's nurse and for her to give her a call back.  Please f/u

## 2016-11-12 NOTE — Telephone Encounter (Signed)
Okay to stay off as soon as possible.

## 2016-11-12 NOTE — Telephone Encounter (Signed)
Spoke with pt and made him aware of recommendation per Dr. Tamala Julian. Pt appreciative for call.

## 2016-11-12 NOTE — Telephone Encounter (Signed)
Pt seen by GI recently and was told he had a GI bleed and they are having him hold his ASA at least until he is seen back 12/24/16.  Hgb 14 according to pt.  Pt stopped ASA yesterday.  Pt wanted to get Dr. Thompson Caul ok on this? Pt concerned about being off of ASA for this long.  Advised I would send message to Dr. Tamala Julian for review and advisement.  Pt also asked about echo.  Reminded him of appt in Sept.  Pt appreciative.

## 2016-11-19 DIAGNOSIS — K921 Melena: Secondary | ICD-10-CM | POA: Diagnosis not present

## 2016-11-19 DIAGNOSIS — Z6825 Body mass index (BMI) 25.0-25.9, adult: Secondary | ICD-10-CM | POA: Diagnosis not present

## 2016-11-19 DIAGNOSIS — I10 Essential (primary) hypertension: Secondary | ICD-10-CM | POA: Diagnosis not present

## 2016-11-19 DIAGNOSIS — R195 Other fecal abnormalities: Secondary | ICD-10-CM | POA: Diagnosis not present

## 2016-12-01 DIAGNOSIS — L570 Actinic keratosis: Secondary | ICD-10-CM | POA: Diagnosis not present

## 2016-12-01 DIAGNOSIS — I10 Essential (primary) hypertension: Secondary | ICD-10-CM | POA: Diagnosis not present

## 2016-12-01 DIAGNOSIS — L821 Other seborrheic keratosis: Secondary | ICD-10-CM | POA: Diagnosis not present

## 2016-12-01 DIAGNOSIS — E038 Other specified hypothyroidism: Secondary | ICD-10-CM | POA: Diagnosis not present

## 2016-12-01 DIAGNOSIS — Z125 Encounter for screening for malignant neoplasm of prostate: Secondary | ICD-10-CM | POA: Diagnosis not present

## 2016-12-01 DIAGNOSIS — D485 Neoplasm of uncertain behavior of skin: Secondary | ICD-10-CM | POA: Diagnosis not present

## 2016-12-01 DIAGNOSIS — D044 Carcinoma in situ of skin of scalp and neck: Secondary | ICD-10-CM | POA: Diagnosis not present

## 2016-12-01 DIAGNOSIS — R7301 Impaired fasting glucose: Secondary | ICD-10-CM | POA: Diagnosis not present

## 2016-12-01 DIAGNOSIS — Z85828 Personal history of other malignant neoplasm of skin: Secondary | ICD-10-CM | POA: Diagnosis not present

## 2016-12-08 DIAGNOSIS — N182 Chronic kidney disease, stage 2 (mild): Secondary | ICD-10-CM | POA: Diagnosis not present

## 2016-12-08 DIAGNOSIS — M5489 Other dorsalgia: Secondary | ICD-10-CM | POA: Diagnosis not present

## 2016-12-08 DIAGNOSIS — H9193 Unspecified hearing loss, bilateral: Secondary | ICD-10-CM | POA: Diagnosis not present

## 2016-12-08 DIAGNOSIS — Z6825 Body mass index (BMI) 25.0-25.9, adult: Secondary | ICD-10-CM | POA: Diagnosis not present

## 2016-12-08 DIAGNOSIS — I2510 Atherosclerotic heart disease of native coronary artery without angina pectoris: Secondary | ICD-10-CM | POA: Diagnosis not present

## 2016-12-08 DIAGNOSIS — R21 Rash and other nonspecific skin eruption: Secondary | ICD-10-CM | POA: Diagnosis not present

## 2016-12-08 DIAGNOSIS — R600 Localized edema: Secondary | ICD-10-CM | POA: Diagnosis not present

## 2016-12-08 DIAGNOSIS — Z0000 Encounter for general adult medical examination without abnormal findings: Secondary | ICD-10-CM | POA: Diagnosis not present

## 2016-12-08 DIAGNOSIS — E038 Other specified hypothyroidism: Secondary | ICD-10-CM | POA: Diagnosis not present

## 2016-12-08 DIAGNOSIS — R011 Cardiac murmur, unspecified: Secondary | ICD-10-CM | POA: Diagnosis not present

## 2016-12-08 DIAGNOSIS — R7301 Impaired fasting glucose: Secondary | ICD-10-CM | POA: Diagnosis not present

## 2016-12-08 DIAGNOSIS — Z1389 Encounter for screening for other disorder: Secondary | ICD-10-CM | POA: Diagnosis not present

## 2016-12-08 DIAGNOSIS — Z1212 Encounter for screening for malignant neoplasm of rectum: Secondary | ICD-10-CM | POA: Diagnosis not present

## 2017-02-28 ENCOUNTER — Other Ambulatory Visit (HOSPITAL_COMMUNITY): Payer: Medicare Other

## 2017-03-04 ENCOUNTER — Ambulatory Visit (HOSPITAL_COMMUNITY): Payer: Medicare Other | Attending: Cardiology

## 2017-03-04 ENCOUNTER — Other Ambulatory Visit: Payer: Self-pay

## 2017-03-04 DIAGNOSIS — E785 Hyperlipidemia, unspecified: Secondary | ICD-10-CM | POA: Diagnosis not present

## 2017-03-04 DIAGNOSIS — I082 Rheumatic disorders of both aortic and tricuspid valves: Secondary | ICD-10-CM | POA: Insufficient documentation

## 2017-03-04 DIAGNOSIS — I350 Nonrheumatic aortic (valve) stenosis: Secondary | ICD-10-CM

## 2017-03-04 DIAGNOSIS — I2510 Atherosclerotic heart disease of native coronary artery without angina pectoris: Secondary | ICD-10-CM | POA: Diagnosis not present

## 2017-03-04 DIAGNOSIS — I10 Essential (primary) hypertension: Secondary | ICD-10-CM | POA: Diagnosis not present

## 2017-03-04 DIAGNOSIS — I4891 Unspecified atrial fibrillation: Secondary | ICD-10-CM | POA: Insufficient documentation

## 2017-04-08 ENCOUNTER — Encounter: Payer: Self-pay | Admitting: Interventional Cardiology

## 2017-04-08 ENCOUNTER — Ambulatory Visit (INDEPENDENT_AMBULATORY_CARE_PROVIDER_SITE_OTHER): Payer: Medicare Other | Admitting: Interventional Cardiology

## 2017-04-08 VITALS — BP 160/66 | HR 56 | Ht 66.5 in | Wt 161.8 lb

## 2017-04-08 DIAGNOSIS — I10 Essential (primary) hypertension: Secondary | ICD-10-CM | POA: Diagnosis not present

## 2017-04-08 DIAGNOSIS — I2510 Atherosclerotic heart disease of native coronary artery without angina pectoris: Secondary | ICD-10-CM | POA: Diagnosis not present

## 2017-04-08 DIAGNOSIS — I480 Paroxysmal atrial fibrillation: Secondary | ICD-10-CM | POA: Diagnosis not present

## 2017-04-08 DIAGNOSIS — I483 Typical atrial flutter: Secondary | ICD-10-CM | POA: Diagnosis not present

## 2017-04-08 DIAGNOSIS — I350 Nonrheumatic aortic (valve) stenosis: Secondary | ICD-10-CM

## 2017-04-08 NOTE — Patient Instructions (Signed)

## 2017-04-08 NOTE — Progress Notes (Signed)
Cardiology Office Note    Date:  04/08/2017   ID:  Billy Barnes, DOB [birth year], MRN [id number]  PCP:  Crist Infante, MD  Cardiologist: Sinclair Grooms, MD   Chief Complaint  Patient presents with  . Coronary Artery Disease  . Cardiac Valve Problem    History of Present Illness:  Billy Barnes is a 83 y.o. male  who presents for Coronary disease with prior bypass, aortic stenosis, history of paroxysmal atrial fibrillation, essential hypertension, and hyperlipidemia.  He is doing okay. He still exercises on the treadmill. He does 20 minute [employer] on the treadmill on a flat angle. Denies orthopnea, PND, chest pain, and syncope.   Past Medical History:  Diagnosis Date  . Atrial fibrillation ([place])   . CAD (coronary artery disease)   . Diverticulosis   . Hypercholesteremia   . Hypertension   . Hypothyroidism   . Murmur   . Paroxysmal atrial fibrillation [facility])     Past Surgical History:  Procedure Laterality Date  . BYPASS GRAFT    . HEMORRHOID SURGERY    . HERNIA REPAIR    . HIP SURGERY      Current Medications: Outpatient Medications Prior to Visit  Medication Sig Dispense Refill  . acidophilus (RISAQUAD) CAPS capsule Take 1 capsule by mouth daily.    Marland Kitchen aspirin 81 MG tablet Take 81 mg by mouth daily.     Marland Kitchen atorvastatin (LIPITOR) 20 MG tablet Take 20 mg by mouth daily.    . cholecalciferol (VITAMIN D) 1000 UNITS tablet Take 1,000 Units by mouth daily.    Marland Kitchen levothyroxine (SYNTHROID, LEVOTHROID) 25 MCG tablet Take 25 mcg by mouth daily before breakfast.    . Multiple Vitamin (MULTIVITAMIN WITH MINERALS) TABS tablet Take 1 tablet by mouth daily.    Marland Kitchen telmisartan-hydrochlorothiazide (MICARDIS HCT) 80-12.5 MG tablet Take 0.5 tablets by mouth daily. 45 tablet 3  . zolpidem (AMBIEN) 10 MG tablet Take 5 mg by mouth at bedtime as needed for sleep.    Marland Kitchen amLODipine (NORVASC) 2.5 MG tablet Take 1 tablet (2.5 mg total) by mouth daily. 30 tablet 6   No  facility-administered medications prior to visit.      Allergies:   Shellfish allergy   Social History   Social History  . Marital status: Married    Spouse name: N/A  . Number of children: N/A  . Years of education: N/A   Social History Main Topics  . Smoking status: Former Research scientist (life sciences)  . Smokeless tobacco: Never Used  . Alcohol use 0.0 oz/week  . Drug use: No  . Sexual activity: No   Other Topics Concern  . None   Social History Narrative  . None     Family History:  The patient's family history includes Heart disease in his mother.   ROS:   Please see the history of present illness.    Appetite is stable. Mobility is decreasing.  All other systems reviewed and are negative.   PHYSICAL EXAM:   VS:  BP (!) 160/66 (BP Location: Left Arm)   Pulse (!) 56   Ht 5' 6.5" (1.689 m)   Wt 161 lb 12.8 oz (73.4 kg)   BMI 25.72 kg/m    GEN: Well nourished, well developed, in no acute distress  HEENT: normal  Neck: no JVD, carotid bruits, or masses Cardiac: RRR; high-pitched 3/6 crescendo decrescendo systolic murmur of aortic stenosis., rubs, or gallops,no edema  Respiratory:  clear to auscultation bilaterally, normal  work of breathing GI: soft, nontender, nondistended, + BS MS: no deformity or atrophy  Skin: warm and dry, no rash Neuro:  Alert and Oriented x 3, Strength and sensation are intact Psych: euthymic mood, full affect  Wt Readings from Last 3 Encounters:  04/08/17 161 lb 12.8 oz (73.4 kg)  03/23/16 165 lb (74.8 kg)  03/18/15 174 lb 6.4 oz (79.1 kg)      Studies/Labs Reviewed:   EKG:  EKG  Sinus bradycardia 56 bpm with nonspecific ST abnormality.  Recent Labs: No results found for requested labs within last 8760 hours.   Lipid Panel No results found for: CHOL, TRIG, HDL, CHOLHDL, VLDL, LDLCALC, LDLDIRECT  Additional studies/ records that were reviewed today include:  2-D Doppler echocardiogram September 2018:  Study Conclusions   - Left ventricle:  The cavity size was normal. There was moderate   focal basal and mild concentric hypertrophy. Systolic function   was normal. The estimated ejection fraction was in the range of   55% to 60%. Wall motion was normal; there were no regional wall   motion abnormalities. There was an increased relative   contribution of atrial contraction to ventricular filling.   Doppler parameters are consistent with abnormal left ventricular   relaxation (grade 1 diastolic dysfunction). - Aortic valve: There was moderate stenosis. There was mild   regurgitation. Mean gradient (S): 17 mm Hg. Peak gradient (S): 29   mm Hg. Valve area (VTI): 1.16 cm^2. Valve area (Vmax): 1.2 cm^2.   Valve area (Vmean): 1.19 cm^2. - Mitral valve: Calcified annulus. - Tricuspid valve: There was mild regurgitation. - Pulmonic valve: There was trivial regurgitation.     ASSESSMENT:    1. Nonrheumatic aortic valve stenosis   2. Typical atrial flutter ([place])   3. Coronary artery disease involving native coronary artery of native heart without angina pectoris   4. Paroxysmal atrial fibrillation ([facility])   5. Essential hypertension      PLAN:  In order of problems listed above:  1. Current transvalvular velocity is 2.7 m/s and therefore mild AS. 2 years ago was transvalvular velocity was 1.8 m/s. He is asymptomatic. We will continue to follow expectantly. He is active. He continues to exercise. 2. No recurrence 3. Asymptomatic with reference to angina 4. Not addressed.  Clinical follow-up 1 year.  Medication Adjustments/Labs and Tests Ordered: Current medicines are reviewed at length with the patient today.  Concerns regarding medicines are outlined above.  Medication changes, Labs and Tests ordered today are listed in the Patient Instructions below. There are no Patient Instructions on file for this visit.   Signed, Sinclair Grooms, MD  04/08/2017 12:06 PM    [address],  [place], [place]  [address] Phone: [phone]; Fax: [phone]

## 2017-04-11 DIAGNOSIS — Z23 Encounter for immunization: Secondary | ICD-10-CM | POA: Diagnosis not present

## 2017-05-19 DIAGNOSIS — H01002 Unspecified blepharitis right lower eyelid: Secondary | ICD-10-CM | POA: Diagnosis not present

## 2017-05-19 DIAGNOSIS — H01004 Unspecified blepharitis left upper eyelid: Secondary | ICD-10-CM | POA: Diagnosis not present

## 2017-05-19 DIAGNOSIS — H52223 Regular astigmatism, bilateral: Secondary | ICD-10-CM | POA: Diagnosis not present

## 2017-05-19 DIAGNOSIS — H26492 Other secondary cataract, left eye: Secondary | ICD-10-CM | POA: Diagnosis not present

## 2017-05-19 DIAGNOSIS — Z961 Presence of intraocular lens: Secondary | ICD-10-CM | POA: Diagnosis not present

## 2017-05-19 DIAGNOSIS — H01005 Unspecified blepharitis left lower eyelid: Secondary | ICD-10-CM | POA: Diagnosis not present

## 2017-05-19 DIAGNOSIS — H5213 Myopia, bilateral: Secondary | ICD-10-CM | POA: Diagnosis not present

## 2017-05-19 DIAGNOSIS — H01001 Unspecified blepharitis right upper eyelid: Secondary | ICD-10-CM | POA: Diagnosis not present

## 2017-05-19 DIAGNOSIS — H353131 Nonexudative age-related macular degeneration, bilateral, early dry stage: Secondary | ICD-10-CM | POA: Diagnosis not present

## 2017-05-19 DIAGNOSIS — H524 Presbyopia: Secondary | ICD-10-CM | POA: Diagnosis not present

## 2017-06-01 ENCOUNTER — Other Ambulatory Visit: Payer: Self-pay | Admitting: Interventional Cardiology

## 2017-07-08 DIAGNOSIS — Z6825 Body mass index (BMI) 25.0-25.9, adult: Secondary | ICD-10-CM | POA: Diagnosis not present

## 2017-07-08 DIAGNOSIS — I10 Essential (primary) hypertension: Secondary | ICD-10-CM | POA: Diagnosis not present

## 2017-07-08 DIAGNOSIS — E038 Other specified hypothyroidism: Secondary | ICD-10-CM | POA: Diagnosis not present

## 2017-07-08 DIAGNOSIS — R011 Cardiac murmur, unspecified: Secondary | ICD-10-CM | POA: Diagnosis not present

## 2017-07-08 DIAGNOSIS — Z1389 Encounter for screening for other disorder: Secondary | ICD-10-CM | POA: Diagnosis not present

## 2017-07-08 DIAGNOSIS — I2510 Atherosclerotic heart disease of native coronary artery without angina pectoris: Secondary | ICD-10-CM | POA: Diagnosis not present

## 2017-07-12 DIAGNOSIS — L821 Other seborrheic keratosis: Secondary | ICD-10-CM | POA: Diagnosis not present

## 2017-07-12 DIAGNOSIS — L570 Actinic keratosis: Secondary | ICD-10-CM | POA: Diagnosis not present

## 2017-07-12 DIAGNOSIS — Z85828 Personal history of other malignant neoplasm of skin: Secondary | ICD-10-CM | POA: Diagnosis not present

## 2017-07-12 DIAGNOSIS — I788 Other diseases of capillaries: Secondary | ICD-10-CM | POA: Diagnosis not present

## 2017-12-01 DIAGNOSIS — Z961 Presence of intraocular lens: Secondary | ICD-10-CM | POA: Diagnosis not present

## 2017-12-01 DIAGNOSIS — H353131 Nonexudative age-related macular degeneration, bilateral, early dry stage: Secondary | ICD-10-CM | POA: Diagnosis not present

## 2017-12-01 DIAGNOSIS — H26492 Other secondary cataract, left eye: Secondary | ICD-10-CM | POA: Diagnosis not present

## 2017-12-28 DIAGNOSIS — I6381 Other cerebral infarction due to occlusion or stenosis of small artery: Secondary | ICD-10-CM | POA: Diagnosis not present

## 2017-12-28 DIAGNOSIS — I4510 Unspecified right bundle-branch block: Secondary | ICD-10-CM | POA: Diagnosis not present

## 2017-12-28 DIAGNOSIS — R29810 Facial weakness: Secondary | ICD-10-CM | POA: Diagnosis present

## 2017-12-28 DIAGNOSIS — R479 Unspecified speech disturbances: Secondary | ICD-10-CM | POA: Diagnosis not present

## 2017-12-28 DIAGNOSIS — Z7982 Long term (current) use of aspirin: Secondary | ICD-10-CM | POA: Diagnosis not present

## 2017-12-28 DIAGNOSIS — G319 Degenerative disease of nervous system, unspecified: Secondary | ICD-10-CM | POA: Diagnosis not present

## 2017-12-28 DIAGNOSIS — E785 Hyperlipidemia, unspecified: Secondary | ICD-10-CM | POA: Diagnosis present

## 2017-12-28 DIAGNOSIS — I350 Nonrheumatic aortic (valve) stenosis: Secondary | ICD-10-CM | POA: Diagnosis not present

## 2017-12-28 DIAGNOSIS — I10 Essential (primary) hypertension: Secondary | ICD-10-CM | POA: Diagnosis present

## 2017-12-28 DIAGNOSIS — R29702 NIHSS score 2: Secondary | ICD-10-CM | POA: Diagnosis present

## 2017-12-28 DIAGNOSIS — G9389 Other specified disorders of brain: Secondary | ICD-10-CM | POA: Diagnosis present

## 2017-12-28 DIAGNOSIS — Z79899 Other long term (current) drug therapy: Secondary | ICD-10-CM | POA: Diagnosis not present

## 2017-12-28 DIAGNOSIS — I2510 Atherosclerotic heart disease of native coronary artery without angina pectoris: Secondary | ICD-10-CM | POA: Diagnosis present

## 2017-12-28 DIAGNOSIS — Z951 Presence of aortocoronary bypass graft: Secondary | ICD-10-CM | POA: Diagnosis not present

## 2017-12-28 DIAGNOSIS — I272 Pulmonary hypertension, unspecified: Secondary | ICD-10-CM | POA: Diagnosis not present

## 2017-12-28 DIAGNOSIS — Z87891 Personal history of nicotine dependence: Secondary | ICD-10-CM | POA: Diagnosis not present

## 2017-12-28 DIAGNOSIS — E039 Hypothyroidism, unspecified: Secondary | ICD-10-CM | POA: Diagnosis present

## 2017-12-28 DIAGNOSIS — I639 Cerebral infarction, unspecified: Secondary | ICD-10-CM | POA: Diagnosis not present

## 2017-12-28 DIAGNOSIS — I440 Atrioventricular block, first degree: Secondary | ICD-10-CM | POA: Diagnosis present

## 2017-12-28 DIAGNOSIS — I480 Paroxysmal atrial fibrillation: Secondary | ICD-10-CM | POA: Diagnosis not present

## 2017-12-28 DIAGNOSIS — R471 Dysarthria and anarthria: Secondary | ICD-10-CM | POA: Diagnosis present

## 2017-12-28 DIAGNOSIS — R4789 Other speech disturbances: Secondary | ICD-10-CM | POA: Diagnosis not present

## 2017-12-28 DIAGNOSIS — I071 Rheumatic tricuspid insufficiency: Secondary | ICD-10-CM | POA: Diagnosis not present

## 2017-12-28 DIAGNOSIS — I159 Secondary hypertension, unspecified: Secondary | ICD-10-CM | POA: Diagnosis not present

## 2017-12-28 DIAGNOSIS — I351 Nonrheumatic aortic (valve) insufficiency: Secondary | ICD-10-CM | POA: Diagnosis not present

## 2017-12-29 ENCOUNTER — Telehealth: Payer: Self-pay | Admitting: Interventional Cardiology

## 2017-12-29 DIAGNOSIS — I071 Rheumatic tricuspid insufficiency: Secondary | ICD-10-CM | POA: Diagnosis not present

## 2017-12-29 DIAGNOSIS — I272 Pulmonary hypertension, unspecified: Secondary | ICD-10-CM | POA: Diagnosis not present

## 2017-12-29 DIAGNOSIS — I639 Cerebral infarction, unspecified: Secondary | ICD-10-CM | POA: Diagnosis not present

## 2017-12-29 DIAGNOSIS — I351 Nonrheumatic aortic (valve) insufficiency: Secondary | ICD-10-CM | POA: Diagnosis not present

## 2017-12-29 NOTE — Telephone Encounter (Signed)
New Message       Patient is needing a Event monitor by Monday. I was asked by the doctor to make appt. To get to monitor by Monday, there was no availability and she asked me to send a message to Dr. Tamala Julian. Pls advise

## 2017-12-29 NOTE — Telephone Encounter (Signed)
Left message to call back  

## 2017-12-30 ENCOUNTER — Telehealth: Payer: Self-pay | Admitting: Interventional Cardiology

## 2017-12-30 MED ORDER — ONDANSETRON HCL 4 MG/2ML IJ SOLN
4.00 | INTRAMUSCULAR | Status: DC
Start: ? — End: 2017-12-30

## 2017-12-30 MED ORDER — SODIUM CHLORIDE 0.9 % IV SOLN
20.00 | INTRAVENOUS | Status: DC
Start: ? — End: 2017-12-30

## 2017-12-30 MED ORDER — ASPIRIN EC 325 MG PO TBEC
325.00 | DELAYED_RELEASE_TABLET | ORAL | Status: DC
Start: 2017-12-30 — End: 2017-12-30

## 2017-12-30 MED ORDER — ATORVASTATIN CALCIUM 20 MG PO TABS
20.00 | ORAL_TABLET | ORAL | Status: DC
Start: 2017-12-29 — End: 2017-12-30

## 2017-12-30 MED ORDER — SODIUM CHLORIDE 0.9 % IV SOLN
75.00 | INTRAVENOUS | Status: DC
Start: ? — End: 2017-12-30

## 2017-12-30 MED ORDER — GENERIC EXTERNAL MEDICATION
Status: DC
Start: ? — End: 2017-12-30

## 2017-12-30 MED ORDER — SODIUM CHLORIDE 0.9 % IV SOLN
10.00 | INTRAVENOUS | Status: DC
Start: ? — End: 2017-12-30

## 2017-12-30 MED ORDER — HYDRALAZINE HCL 20 MG/ML IJ SOLN
10.00 | INTRAMUSCULAR | Status: DC
Start: ? — End: 2017-12-30

## 2017-12-30 MED ORDER — LABETALOL HCL 5 MG/ML IV SOLN
10.00 | INTRAVENOUS | Status: DC
Start: ? — End: 2017-12-30

## 2017-12-30 MED ORDER — ENOXAPARIN SODIUM 80 MG/0.8ML ~~LOC~~ SOLN
1.00 | SUBCUTANEOUS | Status: DC
Start: 2017-12-29 — End: 2017-12-30

## 2017-12-30 MED ORDER — DOCUSATE SODIUM 100 MG PO CAPS
100.00 | ORAL_CAPSULE | ORAL | Status: DC
Start: 2017-12-29 — End: 2017-12-30

## 2017-12-30 NOTE — Telephone Encounter (Signed)
Called patient's daughter about message. Patient's daughter stated that patient had been in the hospital at [facility] in [place] for a stroke. Patient's daughter stated the hospitalist, Dr. Richardean Canal called and spoke with someone about getting a monitor. Informed patient's daughter that we could get patient in for an office visit to follow-up on a day Dr. Tamala Julian is in the office. Informed patient's daughter if she could call [facility] to send patient's records over before office visit, since there are no records in [facility] at this time. Made patient an appointment on Tuesday 01/03/18 with Bhagat PA. Sent message to chart prep to help with records. Patient's daughter reported that patient had an echo while there. Will forward to Dr. Tamala Julian and his nurse so they are aware.

## 2017-12-30 NOTE — Telephone Encounter (Signed)
New Message:      Pt's daughter is calling in reference to an event monitor that was advised for the pt to get when he was discharged from the [facility]. Pt's daughter would like for [place] to call her back due to her being the one who will be bringing the pt to the appt.

## 2018-01-02 NOTE — Telephone Encounter (Signed)
He had what was felt to be an embolic CVA. Needs 30 day event monitor to r/o AF. Thanks for 7/23 appointment with Vin.

## 2018-01-03 ENCOUNTER — Encounter: Payer: Self-pay | Admitting: Physician Assistant

## 2018-01-03 ENCOUNTER — Ambulatory Visit (INDEPENDENT_AMBULATORY_CARE_PROVIDER_SITE_OTHER): Payer: Medicare Other | Admitting: Physician Assistant

## 2018-01-03 VITALS — BP 140/62 | HR 53 | Ht 67.5 in | Wt 156.8 lb

## 2018-01-03 DIAGNOSIS — I10 Essential (primary) hypertension: Secondary | ICD-10-CM | POA: Diagnosis not present

## 2018-01-03 DIAGNOSIS — I480 Paroxysmal atrial fibrillation: Secondary | ICD-10-CM | POA: Diagnosis not present

## 2018-01-03 DIAGNOSIS — I639 Cerebral infarction, unspecified: Secondary | ICD-10-CM | POA: Diagnosis not present

## 2018-01-03 DIAGNOSIS — E785 Hyperlipidemia, unspecified: Secondary | ICD-10-CM

## 2018-01-03 NOTE — Patient Instructions (Signed)
Medication Instructions: Your physician recommends that you continue on your current medications as directed. Please refer to the Current Medication list given to you today.   Labwork: None Ordered  Procedures/Testing: Your physician has recommended that you wear an event monitor. Event monitors are medical devices that record the heart's electrical activity. Doctors most often [place] these monitors to diagnose arrhythmias. Arrhythmias are problems with the speed or rhythm of the heartbeat. The monitor is a small, portable device. You can wear one while you do your normal daily activities. This is usually used to diagnose what is causing palpitations/syncope (passing out).    Follow-Up: Your physician recommends that you schedule a follow-up appointment in: 3-4 months with Dr. Tamala Julian    Any Additional Special Instructions Will Be Listed Below (If Applicable).     If you need a refill on your cardiac medications before your next appointment, please call your pharmacy.

## 2018-01-03 NOTE — Telephone Encounter (Signed)
Pt seeing Vin Bhagat, PA-C today.  Vin made aware of need for event monitor.

## 2018-01-03 NOTE — Progress Notes (Signed)
Cardiology Office Note    Date:  01/03/2018   ID:  Billy Barnes, DOB Oct 11, 1920, MRN [id number]  PCP:  Crist Infante, MD  Cardiologist: Dr. Tamala Julian  Chief Complaint: Hospital follow up for stroke  History of Present Illness:   Billy Barnes is a 84 y.o. male with hx of CAD s/p CABG, moderate aortic stenosis, PAF, HTN and HLD presents for follow up.   Last seen by Dr. Tamala Julian 03/2017--> continued to exercise on Treadmill.   Recently admitted at [facility] for acute ischemic with dysarthria. Possible embolic. Records unavailable for review. Here today for discussion of event monitor.   Here with wife and daughter.  lives independently with wife.  Still drives.  Does household work without any issue.  Denies chest pain, shortness of breath, palpitation, dizziness, headache, orthopnea, PND, syncope, melena or blood in his stool or urine.  Remote history of atrial fibrillation status post cardioversion.  He was on blood thinner for few months.  No recurrence.  Denies history of fall.  Past Medical History:  Diagnosis Date  . Atrial fibrillation ([place])   . CAD (coronary artery disease)   . Diverticulosis   . Hypercholesteremia   . Hypertension   . Hypothyroidism   . Murmur   . Paroxysmal atrial fibrillation [facility])     Past Surgical History:  Procedure Laterality Date  . BYPASS GRAFT    . HEMORRHOID SURGERY    . HERNIA REPAIR    . HIP SURGERY      Current Medications: Prior to Admission medications   Medication Sig Start Date End Date Taking? Authorizing Provider  amLODipine (NORVASC) 2.5 MG tablet Take 2.5 mg by mouth daily.   Yes [provider]  aspirin 325 MG tablet Take 325 mg by mouth daily.   Yes [provider]  atorvastatin (LIPITOR) 20 MG tablet Take 20 mg by mouth daily.   Yes [provider]  cholecalciferol (VITAMIN D) 1000 UNITS tablet Take 1,000 Units by mouth daily.   Yes [provider]  levothyroxine (SYNTHROID, LEVOTHROID) 25  MCG tablet Take 25 mcg by mouth daily before breakfast.   Yes [provider]  Multiple Vitamin (MULTIVITAMIN WITH MINERALS) TABS tablet Take 1 tablet by mouth daily.   Yes [provider]  telmisartan-hydrochlorothiazide (MICARDIS HCT) 80-12.5 MG tablet TAKE 1/2 TABLET EVERY DAY. 06/01/17  Yes Belva Crome, MD  zolpidem (AMBIEN) 10 MG tablet Take 5 mg by mouth at bedtime as needed for sleep.   Yes [provider]    Allergies:   Shellfish allergy   Social History   Socioeconomic History  . Marital status: Married    Spouse name: Not on file  . Number of children: Not on file  . Years of education: Not on file  . Highest education level: Not on file  Occupational History  . Not on file  Social Needs  . Financial resource strain: Not on file  . Food insecurity:    Worry: Not on file    Inability: Not on file  . Transportation needs:    Medical: Not on file    Non-medical: Not on file  Tobacco Use  . Smoking status: Former Research scientist (life sciences)  . Smokeless tobacco: Never Used  Substance and Sexual Activity  . Alcohol use: Yes    Alcohol/week: 0.0 oz  . Drug use: No  . Sexual activity: Never  Lifestyle  . Physical activity:    Days per week: Not on file  Minutes per session: Not on file  . Stress: Not on file  Relationships  . Social connections:    Talks on phone: Not on file    Gets together: Not on file    Attends religious service: Not on file    Active member of club or organization: Not on file    Attends meetings of clubs or organizations: Not on file    Relationship status: Not on file  Other Topics Concern  . Not on file  Social History Narrative  . Not on file     Family History:  The patient's family history includes Heart disease in his mother.   ROS:   Please see the history of present illness.    ROS All other systems reviewed and are negative.   PHYSICAL EXAM:   VS:  BP 140/62   Pulse (!) 53   Ht 5' 7.5" (1.715 m)   Wt 156  lb 12.8 oz (71.1 kg)   SpO2 99%   BMI 24.20 kg/m    GEN: Well nourished, well developed, in no acute distress  HEENT: normal  Neck: no JVD, carotid bruits, or masses Cardiac: RRR;+ systolic  murmurs, rubs, or gallops,no edema  Respiratory:  clear to auscultation bilaterally, normal work of breathing GI: soft, nontender, nondistended, + BS MS: no deformity or atrophy  Skin: warm and dry, no rash Neuro:  Alert and Oriented x 3, Strength and sensation are intact Psych: euthymic mood, full affect  Wt Readings from Last 3 Encounters:  01/03/18 156 lb 12.8 oz (71.1 kg)  04/08/17 161 lb 12.8 oz (73.4 kg)  03/23/16 165 lb (74.8 kg)      Studies/Labs Reviewed:   EKG:  EKG is not ordered today.   Recent Labs: No results found for requested labs within last 8760 hours.   Lipid Panel No results found for: CHOL, TRIG, HDL, CHOLHDL, VLDL, LDLCALC, LDLDIRECT  Additional studies/ records that were reviewed today include:   Echo 12/29/17 Interpretation Summary A complete portable two-dimensional transthoracic echocardiogram with color flow Doppler and Spectral Doppler was performed. Saline contrast injection was performed. There is mild (1+) tricuspid regurgitation. Right ventricular systolic pressure is elevated between 40-26mm Hg, consistent with moderate pulmonary hypertension. The mitral valve leaflets appear thickened, but open well. Grade I mild diastolic dysfunction; abnormal relaxation pattern. The left ventricle is normal in size. There is moderate concentric left ventricular hypertrophy. There is heavy calcification of the aortic valve. Peak and mean AV gradients are 32 and 17 mm Hg, consistent with mild aortic stenosis Injection of contrast documented no interatrial shunt . The left atrium is mildly dilated. The left ventricular ejection fraction is normal (50-55%). The left ventricular wall motion is normal. There is mild [1+] aortic regurgitation present.  Left  Ventricle The left ventricle is normal in size. There is moderate concentric left ventricular hypertrophy. The left ventricular ejection fraction is normal (50-55%). The left ventricular wall motion is normal. Grade I mild diastolic dysfunction; abnormal relaxation pattern.   Right Ventricle The right ventricle is grossly normal in size and function.  Atria Injection of contrast documented no interatrial shunt . The left atrium is mildly dilated. The right atrium is normal.  Mitral Valve There is moderate mitral annular calcification. The mitral valve leaflets appear thickened, but open well. There is trace mitral regurgitation.   Tricuspid Valve The tricuspid valve leaflets are thin and pliable and the valve motion is normal. There is mild (1+) tricuspid regurgitation.Right ventricular systolic pressure is  elevated between 40-47mm Hg, consistent with moderate pulmonary hypertension.  Aortic Valve There is heavy calcification of the aortic valve. Peak and mean AV gradients are 32 and 17 mm Hg, consistent with mild aortic stenosis. There is mild [1+] aortic regurgitation present.  Pulmonic Valve The pulmonic valve is not well seen, but is grossly normal. There is trace pulmonic regurgitation.  Vessels The aortic root is normal in diameter.  Pericardium There is no pericardial effusion.  MMode/2D Measurements & Calculations IVSd: 1.5 cm LVIDd: 3.8 cm  LVIDs: 2.9 cm  LVPWd: 1.5 cm  _____________________________________________________________ LV mass(C)d: 220.9 grams Ao root diam: 3.8 cm  LV mass(C)dI: 119.1 grams/m2 Ao root area: 11.2 cm2  LA dimension: 4.5 cm _____________________________________________________________  LVOT diam: 2.7  cmEDV(sp4-el): 92.3 ml LVOT area: 5.8 cm2 LVAs ap4: 19.1 cm2  LVLs ap4: 6.8 cm  ESV(MOD-sp4): 47.3 ml  ESV(sp4-el): 45.3 ml _____________________________________________________________  SV(MOD-sp4): 40.3 ml SV(sp4-el): 47.0 ml _____________________________________________________________  LAV (MOD-bp) Index: 34.2 ml/m2 LAV(MOD-bp): 63.4 ml  Doppler Measurements & Calculations MV E max vel: 49.5 cm/sec MV A max vel: 95.6 cm/sec MV dec slope: 157.8 cm/sec2 MV E/A: 0.52MV dec time: 0.32 sec  _____________________________________________________________ Ao V2 max: 280.5 cm/sec AI max vel: 194.6 cm/sec Ao max PG: 31.5 mmHgAI max PG: 15.1 mmHg Ao V2 mean: 194.1 cm/sec Ao mean PG: 16.8 mmHg AI dec slope: 90.7 cm/sec2 Ao V2 VTI: 75.8 cmAI P1/2t: 628.1 msec  AVA(I,D): 1.4 cm2 AVA(V,D): 1.3 cm2 _____________________________________________________________  LV V1 max PG: 1.6 mmHgSV(LVOT): 109.4 ml LV V1 mean PG: 0.61 mmHg LV V1 max: 62.6 cm/sec LV V1 mean: 35.7 cm/sec LV V1 VTI: 18.8 cm _____________________________________________________________  TR max vel: 285.0 cm/secRAP systole: 3.0 mmHg TR max PG: 32.5 mmHg RVSP(TR): 35.5 mmHg   ASSESSMENT & PLAN:    1. Remote history of atrial fibrillation status post cardioversion (no recurrence) -Recent acute ischemic stroke.  Denies palpitation, dizziness or syncope.  Patient is very independent and functional at this age.  Will get 30-days event monitor.  Echo with normal LVEF.  2.  Aortic stenosis -Mild by recent echocardiogram. Peak and mean AV gradients are 32 and 17  mm Hg.  3. HTN - BP stable  4. HLD - Continue statin    Medication Adjustments/Labs and Tests Ordered: Current medicines are reviewed at length with the patient today.  Concerns regarding medicines are outlined above.  Medication changes, Labs and Tests ordered today are listed in the Patient Instructions below. Patient Instructions  Medication Instructions: Your physician recommends that you continue on your current medications as directed. Please refer to the Current Medication list given to you today.   Labwork: None Ordered  Procedures/Testing: Your physician has recommended that you wear an event monitor. Event monitors are medical devices that record the heart's electrical activity. Doctors most often [place] these monitors to diagnose arrhythmias. Arrhythmias are problems with the speed or rhythm of the heartbeat. The monitor is a small, portable device. You can wear one while you do your normal daily activities. This is usually used to diagnose what is causing palpitations/syncope (passing out).    Follow-Up: Your physician recommends that you schedule a follow-up appointment in: 3-4 months with Dr. Tamala Julian    Any Additional Special Instructions Will Be Listed Below (If Applicable).     If you need a refill on your cardiac medications before your next appointment, please call your pharmacy.      Jarrett Soho, [place]  01/03/2018 11:35 AM    [facility] [address], [place], [place]  [address] Phone: [phone]; Fax: [phone]

## 2018-01-10 ENCOUNTER — Other Ambulatory Visit: Payer: Self-pay | Admitting: Physician Assistant

## 2018-01-10 ENCOUNTER — Ambulatory Visit (INDEPENDENT_AMBULATORY_CARE_PROVIDER_SITE_OTHER): Payer: Medicare Other

## 2018-01-10 DIAGNOSIS — I639 Cerebral infarction, unspecified: Secondary | ICD-10-CM

## 2018-01-10 DIAGNOSIS — Z85828 Personal history of other malignant neoplasm of skin: Secondary | ICD-10-CM | POA: Diagnosis not present

## 2018-01-10 DIAGNOSIS — L821 Other seborrheic keratosis: Secondary | ICD-10-CM | POA: Diagnosis not present

## 2018-01-10 DIAGNOSIS — I4891 Unspecified atrial fibrillation: Secondary | ICD-10-CM

## 2018-01-10 DIAGNOSIS — L570 Actinic keratosis: Secondary | ICD-10-CM | POA: Diagnosis not present

## 2018-01-10 DIAGNOSIS — D044 Carcinoma in situ of skin of scalp and neck: Secondary | ICD-10-CM | POA: Diagnosis not present

## 2018-01-10 DIAGNOSIS — D485 Neoplasm of uncertain behavior of skin: Secondary | ICD-10-CM | POA: Diagnosis not present

## 2018-01-23 DIAGNOSIS — R062 Wheezing: Secondary | ICD-10-CM | POA: Diagnosis not present

## 2018-01-23 DIAGNOSIS — J209 Acute bronchitis, unspecified: Secondary | ICD-10-CM | POA: Diagnosis not present

## 2018-01-23 DIAGNOSIS — Z6824 Body mass index (BMI) 24.0-24.9, adult: Secondary | ICD-10-CM | POA: Diagnosis not present

## 2018-01-23 DIAGNOSIS — R0982 Postnasal drip: Secondary | ICD-10-CM | POA: Diagnosis not present

## 2018-01-26 DIAGNOSIS — R7301 Impaired fasting glucose: Secondary | ICD-10-CM | POA: Diagnosis not present

## 2018-01-26 DIAGNOSIS — I10 Essential (primary) hypertension: Secondary | ICD-10-CM | POA: Diagnosis not present

## 2018-01-26 DIAGNOSIS — E038 Other specified hypothyroidism: Secondary | ICD-10-CM | POA: Diagnosis not present

## 2018-01-26 DIAGNOSIS — Z125 Encounter for screening for malignant neoplasm of prostate: Secondary | ICD-10-CM | POA: Diagnosis not present

## 2018-01-30 ENCOUNTER — Telehealth: Payer: Self-pay

## 2018-01-30 NOTE — Telephone Encounter (Signed)
Called patient to check per [employer] if the patient is wearing his Event Monitor.  [employer] has not received transmissions for the patient for more than 2 days.  I tried to reach patient, but had no success.

## 2018-01-31 ENCOUNTER — Telehealth: Payer: Self-pay

## 2018-01-31 NOTE — Telephone Encounter (Signed)
Spoke to [employer] and informed them that the patient has terminated service.  They will make a note of it.

## 2018-01-31 NOTE — Telephone Encounter (Signed)
Spoke to the patient and [employer].  We concluded that the patient has terminated his monitor and is returning equipment.

## 2018-02-03 DIAGNOSIS — R011 Cardiac murmur, unspecified: Secondary | ICD-10-CM | POA: Diagnosis not present

## 2018-02-03 DIAGNOSIS — E871 Hypo-osmolality and hyponatremia: Secondary | ICD-10-CM | POA: Diagnosis not present

## 2018-02-03 DIAGNOSIS — M5489 Other dorsalgia: Secondary | ICD-10-CM | POA: Diagnosis not present

## 2018-02-03 DIAGNOSIS — Z0000 Encounter for general adult medical examination without abnormal findings: Secondary | ICD-10-CM | POA: Diagnosis not present

## 2018-02-03 DIAGNOSIS — I2510 Atherosclerotic heart disease of native coronary artery without angina pectoris: Secondary | ICD-10-CM | POA: Diagnosis not present

## 2018-02-03 DIAGNOSIS — R7301 Impaired fasting glucose: Secondary | ICD-10-CM | POA: Diagnosis not present

## 2018-02-03 DIAGNOSIS — I6389 Other cerebral infarction: Secondary | ICD-10-CM | POA: Diagnosis not present

## 2018-02-03 DIAGNOSIS — Z1389 Encounter for screening for other disorder: Secondary | ICD-10-CM | POA: Diagnosis not present

## 2018-02-03 DIAGNOSIS — Z1212 Encounter for screening for malignant neoplasm of rectum: Secondary | ICD-10-CM | POA: Diagnosis not present

## 2018-02-03 DIAGNOSIS — Z23 Encounter for immunization: Secondary | ICD-10-CM | POA: Diagnosis not present

## 2018-02-03 DIAGNOSIS — R600 Localized edema: Secondary | ICD-10-CM | POA: Diagnosis not present

## 2018-02-03 DIAGNOSIS — Z6824 Body mass index (BMI) 24.0-24.9, adult: Secondary | ICD-10-CM | POA: Diagnosis not present

## 2018-02-03 DIAGNOSIS — N182 Chronic kidney disease, stage 2 (mild): Secondary | ICD-10-CM | POA: Diagnosis not present

## 2018-02-03 DIAGNOSIS — J449 Chronic obstructive pulmonary disease, unspecified: Secondary | ICD-10-CM | POA: Diagnosis not present

## 2018-02-03 DIAGNOSIS — I10 Essential (primary) hypertension: Secondary | ICD-10-CM | POA: Diagnosis not present

## 2018-02-03 DIAGNOSIS — R82998 Other abnormal findings in urine: Secondary | ICD-10-CM | POA: Diagnosis not present

## 2018-02-27 DIAGNOSIS — I639 Cerebral infarction, unspecified: Secondary | ICD-10-CM | POA: Insufficient documentation

## 2018-02-27 NOTE — Progress Notes (Signed)
Cardiology Office Note:    Date:  02/28/2018   ID:  ADEBAYO ENSMINGER, DOB [birth year], MRN [id number]  PCP:  Crist Infante, MD  Cardiologist:  No primary care provider on file.   Referring MD: Crist Infante, MD   Chief Complaint  Patient presents with  . Atrial Fibrillation  . Coronary Artery Disease    History of Present Illness:    Billy Barnes is a 84 y.o. male with a hx of Coronary disease with prior bypass, aortic stenosis, history of paroxysmal atrial fibrillation, essential hypertension, presumed colonic diverticular hemorrhage 2002 requiring transfusion, and hyperlipidemia.  Had a recent embolic CVA.  Ms. Goza is here today to follow-up.  Had recent presumed embolic CVA  Past Medical History:  Diagnosis Date  . Atrial fibrillation ([place])   . CAD (coronary artery disease)   . Diverticulosis   . Hypercholesteremia   . Hypertension   . Hypothyroidism   . Murmur   . Paroxysmal atrial fibrillation [facility])     Past Surgical History:  Procedure Laterality Date  . BYPASS GRAFT    . HEMORRHOID SURGERY    . HERNIA REPAIR    . HIP SURGERY      Current Medications: Current Meds  Medication Sig  . aspirin 325 MG tablet Take 325 mg by mouth daily.  Marland Kitchen atorvastatin (LIPITOR) 20 MG tablet Take 20 mg by mouth daily.  . cholecalciferol (VITAMIN D) 1000 UNITS tablet Take 1,000 Units by mouth daily.  Marland Kitchen levothyroxine (SYNTHROID, LEVOTHROID) 25 MCG tablet Take 25 mcg by mouth daily before breakfast.  . Multiple Vitamin (MULTIVITAMIN WITH MINERALS) TABS tablet Take 1 tablet by mouth daily.  Marland Kitchen telmisartan-hydrochlorothiazide (MICARDIS HCT) 80-12.5 MG tablet TAKE 1/2 TABLET EVERY DAY.  Marland Kitchen zolpidem (AMBIEN) 10 MG tablet Take 5 mg by mouth at bedtime as needed for sleep.     Allergies:   Shellfish allergy   Social History   Socioeconomic History  . Marital status: Married    Spouse name: Not on file  . Number of children: Not on file  . Years of education: Not on file  .  Highest education level: Not on file  Occupational History  . Not on file  Social Needs  . Financial resource strain: Not on file  . Food insecurity:    Worry: Not on file    Inability: Not on file  . Transportation needs:    Medical: Not on file    Non-medical: Not on file  Tobacco Use  . Smoking status: Former Research scientist (life sciences)  . Smokeless tobacco: Never Used  Substance and Sexual Activity  . Alcohol use: Yes    Alcohol/week: 0.0 standard drinks  . Drug use: No  . Sexual activity: Never  Lifestyle  . Physical activity:    Days per week: Not on file    Minutes per session: Not on file  . Stress: Not on file  Relationships  . Social connections:    Talks on phone: Not on file    Gets together: Not on file    Attends religious service: Not on file    Active member of club or organization: Not on file    Attends meetings of clubs or organizations: Not on file    Relationship status: Not on file  Other Topics Concern  . Not on file  Social History Narrative  . Not on file     Family History: The patient's family history includes Heart disease in his mother.  ROS:  Please see the history of present illness.    Prior history of significant lower intestinal bleeding.  Easy bruising.  Difficulty with balance.  All other systems reviewed and are negative.  EKGs/Labs/Other Studies Reviewed:    The following studies were reviewed today:  Continuous ambulatory monitor July 2019: Study Highlights    Basic rhythm is sinus with sinus bradycardia.  Occasional atrial fibrillation with controlled rate.  No symptoms/events recorded.     2D Doppler echocardiogram July 2019 at [facility]: Interpretation Summary A complete portable two-dimensional transthoracic echocardiogram with color flow Doppler and Spectral Doppler was performed. Saline contrast injection was performed. There is mild (1+) tricuspid regurgitation. Right ventricular systolic pressure is elevated between 40-93mm  Hg, consistent with moderate pulmonary hypertension. The mitral valve leaflets appear thickened, but open well. Grade I mild diastolic dysfunction; abnormal relaxation pattern. The left ventricle is normal in size. There is moderate concentric left ventricular hypertrophy. There is heavy calcification of the aortic valve. Peak and mean AV gradients are 32 and 17 mm Hg, consistent with mild aortic stenosis Injection of contrast documented no interatrial shunt . The left atrium is mildly dilated. The left ventricular ejection fraction is normal (50-55%). The left ventricular wall motion is normal. There is mild [1+] aortic regurgitation present.  MRI of head without IV contrast: 12/28/2017: Impressions Performed At  IMPRESSION:  1.Small acute infarct in left parietal lobe.  2. Generalized ventriculomegaly out of proportion to generalized brain atrophy may represent normal pressure hydrocephalus.  3.Chronic small vessel ischemic disease.         EKG:  EKG is performed and does demonstrate sinus arrhythmia, occasional PAC, normal PR interval, single PVC.  No significant change when compared to prior.  Recent Labs: No results found for requested labs within last 8760 hours.  Recent Lipid Panel No results found for: CHOL, TRIG, HDL, CHOLHDL, VLDL, LDLCALC, LDLDIRECT  Physical Exam:    VS:  BP (!) 142/56   Pulse 68   Ht 5\' 7"  (1.702 m)   Wt 157 lb (71.2 kg)   BMI 24.59 kg/m     Wt Readings from Last 3 Encounters:  02/28/18 157 lb (71.2 kg)  01/03/18 156 lb 12.8 oz (71.1 kg)  04/08/17 161 lb 12.8 oz (73.4 kg)     GEN: Elderly but well nourished, well developed in no acute distress HEENT: Asymmetry of the mouth with slight drooping on the right. NECK: No JVD. LYMPHATICS: No lymphadenopathy CARDIAC: No RRR, 3/6 crescendo decrescendo systolic aortic stenosis murmur, S4 gallop, no edema. VASCULAR: Bilateral 2+ carotid and radial pulses.  Transmitted from aortic valve  bilateral carotid bruits. RESPIRATORY:  Clear to auscultation without rales, wheezing or rhonchi  ABDOMEN: Soft, non-tender, non-distended, No pulsatile mass, MUSCULOSKELETAL: No deformity  SKIN: Warm and dry NEUROLOGIC:  Alert and oriented x 3 PSYCHIATRIC:  Normal affect   ASSESSMENT:    1. Cerebrovascular accident (CVA) due to embolism of cerebral artery ([place])   2. Coronary artery disease involving native coronary artery of native heart without angina pectoris   3. Paroxysmal atrial fibrillation (HCC)   4. Nonrheumatic aortic valve stenosis   5. Essential hypertension   6. Hypercholesteremia    PLAN:    In order of problems listed above:  1. Recent left parietal stroke.  Documented at [facility].  Continuous ambulatory monitor demonstrated episodes of atrial fibrillation with controlled rate.  Patient has prior history of atrial fib/flutter.  Not on anticoagulation therapy. 2. Prior CABG greater than 10 years ago.  Asymptomatic. 3. Paroxysmal atrial fibrillation identified on continuous monitoring with controlled ventricular response. CHADS VASC Score 5.  Therefore has low double-digit yearly risk of stroke.  Discussed the pros and cons of anticoagulation versus antiplatelet therapy.  This was discussed in the context of his known prior history of life-threatening lower GI bleeding from diverticular disease while not on anticoagulation therapy greater than 15 years ago.  No recurrent bleeding since that time.  After some lengthy debate, we have settled on continuing aspirin 81 mg/day. 4. Moderate aortic stenosis by recent echo performed at [facility] at the time of stroke. 5. Target blood pressure 130/80 mmHg. 6. LDL cholesterol target less than 70.  Lengthy conversation concerning ongoing approximately 10% risk per year of embolic stroke given history of PAF and documenting asymptomatic atrial fibrillation on continuous monitor.  Patient had a significant life-threatening  diverticula lower gastrointestinal bleed greater than 15 years ago on no anticoagulation therapy.  He required transfusion.  He has active aortic valve disease.  He would be at increased risk of bleeding.  After discussion we have decided to continue aspirin 81 mg/day as the lesser of 2 evils with reference to brain protection versus the risk of bleeding.  Prolonged office visit, > 40 min., with greater than 50% of the time spent in counseling concerning anticoagulation and the pros and cons of stroke protection versus bleeding risk.  Medication Adjustments/Labs and Tests Ordered: Current medicines are reviewed at length with the patient today.  Concerns regarding medicines are outlined above.  Orders Placed This Encounter  Procedures  . EKG 12-Lead  . ECHOCARDIOGRAM COMPLETE   No orders of the defined types were placed in this encounter.   Patient Instructions  Medication Instructions:  Your physician recommends that you continue on your current medications as directed. Please refer to the Current Medication list given to you today.  Labwork: None  Testing/Procedures: Your physician has requested that you have an echocardiogram prior to seeing Dr. Tamala Julian back in 6-8 months. Echocardiography is a painless test that uses sound waves to create images of your heart. It provides your doctor with information about the size and shape of your heart and how well your heart's chambers and valves are working. This procedure takes approximately one hour. There are no restrictions for this procedure.    Follow-Up: Your physician wants you to follow-up in: 6-8 months with Dr. Tamala Julian. You will receive a reminder letter in the mail two months in advance. If you don't receive a letter, please call our office to schedule the follow-up appointment.   Any Other Special Instructions Will Be Listed Below (If Applicable).     If you need a refill on your cardiac medications before your next appointment,  please call your pharmacy.      Signed, Sinclair Grooms, MD  02/28/2018 5:54 PM    [place]

## 2018-02-28 ENCOUNTER — Encounter (INDEPENDENT_AMBULATORY_CARE_PROVIDER_SITE_OTHER): Payer: Self-pay

## 2018-02-28 ENCOUNTER — Ambulatory Visit (INDEPENDENT_AMBULATORY_CARE_PROVIDER_SITE_OTHER): Payer: Medicare Other | Admitting: Interventional Cardiology

## 2018-02-28 ENCOUNTER — Encounter: Payer: Self-pay | Admitting: Interventional Cardiology

## 2018-02-28 VITALS — BP 142/56 | HR 68 | Ht 67.0 in | Wt 157.0 lb

## 2018-02-28 DIAGNOSIS — I10 Essential (primary) hypertension: Secondary | ICD-10-CM | POA: Diagnosis not present

## 2018-02-28 DIAGNOSIS — I350 Nonrheumatic aortic (valve) stenosis: Secondary | ICD-10-CM

## 2018-02-28 DIAGNOSIS — I639 Cerebral infarction, unspecified: Secondary | ICD-10-CM

## 2018-02-28 DIAGNOSIS — I2510 Atherosclerotic heart disease of native coronary artery without angina pectoris: Secondary | ICD-10-CM | POA: Diagnosis not present

## 2018-02-28 DIAGNOSIS — E7800 Pure hypercholesterolemia, unspecified: Secondary | ICD-10-CM

## 2018-02-28 DIAGNOSIS — I480 Paroxysmal atrial fibrillation: Secondary | ICD-10-CM | POA: Diagnosis not present

## 2018-02-28 DIAGNOSIS — I6340 Cerebral infarction due to embolism of unspecified cerebral artery: Secondary | ICD-10-CM

## 2018-02-28 NOTE — Patient Instructions (Signed)
Medication Instructions:  Your physician recommends that you continue on your current medications as directed. Please refer to the Current Medication list given to you today.  Labwork: None  Testing/Procedures: Your physician has requested that you have an echocardiogram prior to seeing Dr. Tamala Julian back in 6-8 months. Echocardiography is a painless test that uses sound waves to create images of your heart. It provides your doctor with information about the size and shape of your heart and how well your heart's chambers and valves are working. This procedure takes approximately one hour. There are no restrictions for this procedure.    Follow-Up: Your physician wants you to follow-up in: 6-8 months with Dr. Tamala Julian. You will receive a reminder letter in the mail two months in advance. If you don't receive a letter, please call our office to schedule the follow-up appointment.   Any Other Special Instructions Will Be Listed Below (If Applicable).     If you need a refill on your cardiac medications before your next appointment, please call your pharmacy.

## 2018-04-10 ENCOUNTER — Ambulatory Visit: Payer: Medicare Other | Admitting: Interventional Cardiology

## 2018-04-13 ENCOUNTER — Other Ambulatory Visit: Payer: Self-pay | Admitting: Interventional Cardiology

## 2018-04-21 DIAGNOSIS — K922 Gastrointestinal hemorrhage, unspecified: Secondary | ICD-10-CM | POA: Diagnosis not present

## 2018-04-21 DIAGNOSIS — I10 Essential (primary) hypertension: Secondary | ICD-10-CM | POA: Diagnosis not present

## 2018-04-21 DIAGNOSIS — Z6823 Body mass index (BMI) 23.0-23.9, adult: Secondary | ICD-10-CM | POA: Diagnosis not present

## 2018-04-21 DIAGNOSIS — W19XXXA Unspecified fall, initial encounter: Secondary | ICD-10-CM | POA: Diagnosis not present

## 2018-04-21 DIAGNOSIS — K921 Melena: Secondary | ICD-10-CM | POA: Diagnosis not present

## 2018-04-24 DIAGNOSIS — K922 Gastrointestinal hemorrhage, unspecified: Secondary | ICD-10-CM | POA: Diagnosis not present

## 2018-04-24 DIAGNOSIS — Z6823 Body mass index (BMI) 23.0-23.9, adult: Secondary | ICD-10-CM | POA: Diagnosis not present

## 2018-04-24 DIAGNOSIS — K921 Melena: Secondary | ICD-10-CM | POA: Diagnosis not present

## 2018-04-24 DIAGNOSIS — I10 Essential (primary) hypertension: Secondary | ICD-10-CM | POA: Diagnosis not present

## 2018-05-01 DIAGNOSIS — R05 Cough: Secondary | ICD-10-CM | POA: Diagnosis not present

## 2018-05-01 DIAGNOSIS — K922 Gastrointestinal hemorrhage, unspecified: Secondary | ICD-10-CM | POA: Diagnosis not present

## 2018-05-01 DIAGNOSIS — Z6824 Body mass index (BMI) 24.0-24.9, adult: Secondary | ICD-10-CM | POA: Diagnosis not present

## 2018-05-01 DIAGNOSIS — I480 Paroxysmal atrial fibrillation: Secondary | ICD-10-CM | POA: Diagnosis not present

## 2018-05-01 DIAGNOSIS — I10 Essential (primary) hypertension: Secondary | ICD-10-CM | POA: Diagnosis not present

## 2018-05-01 DIAGNOSIS — K921 Melena: Secondary | ICD-10-CM | POA: Diagnosis not present

## 2018-05-09 DIAGNOSIS — M545 Low back pain: Secondary | ICD-10-CM | POA: Diagnosis not present

## 2018-05-09 DIAGNOSIS — R918 Other nonspecific abnormal finding of lung field: Secondary | ICD-10-CM | POA: Diagnosis not present

## 2018-05-09 DIAGNOSIS — M5186 Other intervertebral disc disorders, lumbar region: Secondary | ICD-10-CM | POA: Diagnosis not present

## 2018-05-09 DIAGNOSIS — R2681 Unsteadiness on feet: Secondary | ICD-10-CM | POA: Diagnosis not present

## 2018-05-09 DIAGNOSIS — M6281 Muscle weakness (generalized): Secondary | ICD-10-CM | POA: Diagnosis not present

## 2018-05-09 DIAGNOSIS — H902 Conductive hearing loss, unspecified: Secondary | ICD-10-CM | POA: Diagnosis not present

## 2018-05-17 DIAGNOSIS — K922 Gastrointestinal hemorrhage, unspecified: Secondary | ICD-10-CM | POA: Diagnosis not present

## 2018-05-18 DIAGNOSIS — H524 Presbyopia: Secondary | ICD-10-CM | POA: Diagnosis not present

## 2018-05-18 DIAGNOSIS — H26492 Other secondary cataract, left eye: Secondary | ICD-10-CM | POA: Diagnosis not present

## 2018-05-18 DIAGNOSIS — H01001 Unspecified blepharitis right upper eyelid: Secondary | ICD-10-CM | POA: Diagnosis not present

## 2018-05-18 DIAGNOSIS — H5213 Myopia, bilateral: Secondary | ICD-10-CM | POA: Diagnosis not present

## 2018-05-18 DIAGNOSIS — H01002 Unspecified blepharitis right lower eyelid: Secondary | ICD-10-CM | POA: Diagnosis not present

## 2018-05-18 DIAGNOSIS — H52223 Regular astigmatism, bilateral: Secondary | ICD-10-CM | POA: Diagnosis not present

## 2018-05-18 DIAGNOSIS — H01004 Unspecified blepharitis left upper eyelid: Secondary | ICD-10-CM | POA: Diagnosis not present

## 2018-06-15 DIAGNOSIS — R2681 Unsteadiness on feet: Secondary | ICD-10-CM | POA: Diagnosis not present

## 2018-06-15 DIAGNOSIS — M6281 Muscle weakness (generalized): Secondary | ICD-10-CM | POA: Diagnosis not present

## 2018-06-15 DIAGNOSIS — M545 Low back pain: Secondary | ICD-10-CM | POA: Diagnosis not present

## 2018-06-15 DIAGNOSIS — M5416 Radiculopathy, lumbar region: Secondary | ICD-10-CM | POA: Diagnosis not present

## 2018-06-20 DIAGNOSIS — M5416 Radiculopathy, lumbar region: Secondary | ICD-10-CM | POA: Diagnosis not present

## 2018-06-20 DIAGNOSIS — M6281 Muscle weakness (generalized): Secondary | ICD-10-CM | POA: Diagnosis not present

## 2018-06-20 DIAGNOSIS — R2681 Unsteadiness on feet: Secondary | ICD-10-CM | POA: Diagnosis not present

## 2018-06-20 DIAGNOSIS — M545 Low back pain: Secondary | ICD-10-CM | POA: Diagnosis not present

## 2018-06-22 DIAGNOSIS — M545 Low back pain: Secondary | ICD-10-CM | POA: Diagnosis not present

## 2018-06-22 DIAGNOSIS — M6281 Muscle weakness (generalized): Secondary | ICD-10-CM | POA: Diagnosis not present

## 2018-06-22 DIAGNOSIS — M5416 Radiculopathy, lumbar region: Secondary | ICD-10-CM | POA: Diagnosis not present

## 2018-06-22 DIAGNOSIS — R2681 Unsteadiness on feet: Secondary | ICD-10-CM | POA: Diagnosis not present

## 2018-06-27 DIAGNOSIS — R2681 Unsteadiness on feet: Secondary | ICD-10-CM | POA: Diagnosis not present

## 2018-06-27 DIAGNOSIS — M5416 Radiculopathy, lumbar region: Secondary | ICD-10-CM | POA: Diagnosis not present

## 2018-06-27 DIAGNOSIS — M545 Low back pain: Secondary | ICD-10-CM | POA: Diagnosis not present

## 2018-06-27 DIAGNOSIS — M6281 Muscle weakness (generalized): Secondary | ICD-10-CM | POA: Diagnosis not present

## 2018-07-13 DIAGNOSIS — D1801 Hemangioma of skin and subcutaneous tissue: Secondary | ICD-10-CM | POA: Diagnosis not present

## 2018-07-13 DIAGNOSIS — L821 Other seborrheic keratosis: Secondary | ICD-10-CM | POA: Diagnosis not present

## 2018-07-13 DIAGNOSIS — D044 Carcinoma in situ of skin of scalp and neck: Secondary | ICD-10-CM | POA: Diagnosis not present

## 2018-07-13 DIAGNOSIS — L570 Actinic keratosis: Secondary | ICD-10-CM | POA: Diagnosis not present

## 2018-07-13 DIAGNOSIS — Z85828 Personal history of other malignant neoplasm of skin: Secondary | ICD-10-CM | POA: Diagnosis not present

## 2018-07-13 DIAGNOSIS — D485 Neoplasm of uncertain behavior of skin: Secondary | ICD-10-CM | POA: Diagnosis not present

## 2018-07-13 DIAGNOSIS — L309 Dermatitis, unspecified: Secondary | ICD-10-CM | POA: Diagnosis not present

## 2018-08-03 DIAGNOSIS — E038 Other specified hypothyroidism: Secondary | ICD-10-CM | POA: Diagnosis not present

## 2018-08-03 DIAGNOSIS — I2510 Atherosclerotic heart disease of native coronary artery without angina pectoris: Secondary | ICD-10-CM | POA: Diagnosis not present

## 2018-08-03 DIAGNOSIS — I480 Paroxysmal atrial fibrillation: Secondary | ICD-10-CM | POA: Diagnosis not present

## 2018-08-03 DIAGNOSIS — I10 Essential (primary) hypertension: Secondary | ICD-10-CM | POA: Diagnosis not present

## 2018-08-03 DIAGNOSIS — Z6824 Body mass index (BMI) 24.0-24.9, adult: Secondary | ICD-10-CM | POA: Diagnosis not present

## 2018-08-03 DIAGNOSIS — I350 Nonrheumatic aortic (valve) stenosis: Secondary | ICD-10-CM | POA: Diagnosis not present

## 2018-08-03 DIAGNOSIS — I6389 Other cerebral infarction: Secondary | ICD-10-CM | POA: Diagnosis not present

## 2018-08-03 DIAGNOSIS — J449 Chronic obstructive pulmonary disease, unspecified: Secondary | ICD-10-CM | POA: Diagnosis not present

## 2018-08-03 DIAGNOSIS — D7589 Other specified diseases of blood and blood-forming organs: Secondary | ICD-10-CM | POA: Diagnosis not present

## 2018-09-20 ENCOUNTER — Other Ambulatory Visit (HOSPITAL_COMMUNITY): Payer: Medicare Other

## 2018-10-16 ENCOUNTER — Ambulatory Visit: Payer: Medicare Other | Admitting: Interventional Cardiology

## 2018-10-23 ENCOUNTER — Other Ambulatory Visit (HOSPITAL_COMMUNITY): Payer: Medicare Other

## 2019-01-15 ENCOUNTER — Other Ambulatory Visit (HOSPITAL_COMMUNITY): Payer: Medicare Other

## 2019-01-18 DIAGNOSIS — I480 Paroxysmal atrial fibrillation: Secondary | ICD-10-CM | POA: Diagnosis not present

## 2019-01-18 DIAGNOSIS — I6389 Other cerebral infarction: Secondary | ICD-10-CM | POA: Diagnosis not present

## 2019-01-18 DIAGNOSIS — I10 Essential (primary) hypertension: Secondary | ICD-10-CM | POA: Diagnosis not present

## 2019-01-18 DIAGNOSIS — I350 Nonrheumatic aortic (valve) stenosis: Secondary | ICD-10-CM | POA: Diagnosis not present

## 2019-01-18 DIAGNOSIS — E039 Hypothyroidism, unspecified: Secondary | ICD-10-CM | POA: Diagnosis not present

## 2019-01-18 DIAGNOSIS — R7301 Impaired fasting glucose: Secondary | ICD-10-CM | POA: Diagnosis not present

## 2019-01-18 DIAGNOSIS — K922 Gastrointestinal hemorrhage, unspecified: Secondary | ICD-10-CM | POA: Diagnosis not present

## 2019-01-18 DIAGNOSIS — J449 Chronic obstructive pulmonary disease, unspecified: Secondary | ICD-10-CM | POA: Diagnosis not present

## 2019-01-18 DIAGNOSIS — I2510 Atherosclerotic heart disease of native coronary artery without angina pectoris: Secondary | ICD-10-CM | POA: Diagnosis not present

## 2019-01-18 DIAGNOSIS — E871 Hypo-osmolality and hyponatremia: Secondary | ICD-10-CM | POA: Diagnosis not present

## 2019-02-02 ENCOUNTER — Other Ambulatory Visit: Payer: Self-pay | Admitting: *Deleted

## 2019-02-02 ENCOUNTER — Telehealth: Payer: Self-pay | Admitting: *Deleted

## 2019-02-02 NOTE — Telephone Encounter (Signed)

## 2019-02-04 NOTE — Progress Notes (Signed)
Virtual Visit via Video Note   This visit type was conducted due to national recommendations for restrictions regarding the COVID-19 Pandemic (e.g. social distancing) in an effort to limit this patient's exposure and mitigate transmission in our community.  Due to his co-morbid illnesses, this patient is at least at moderate risk for complications without adequate follow up.  This format is felt to be most appropriate for this patient at this time.  All issues noted in this document were discussed and addressed.  A limited physical exam was performed with this format.  Please refer to the patient's chart for his consent to telehealth for [facility].   Date:  02/04/2019   ID:  Billy Barnes, DOB 1921-01-28, MRN [id number]  Patient Location: Home Provider Location: Home  PCP:  Crist Infante, MD  Cardiologist:  Sinclair Grooms, MD  Electrophysiologist:  None   Evaluation Performed:  Follow-Up Visit  Chief Complaint:  PAF/CVA/CAD/No anticoagulation  History of Present Illness:    Billy Barnes is a 85 y.o. male with Coronary disease with prior bypass, aortic stenosis, history of paroxysmal atrial fibrillation, essential hypertension, presumed colonic diverticular hemorrhage 2002 requiring transfusion, embolic CVA [id number], and hyperlipidemia.    Billy Barnes is accompanied by his daughter on this virtual visit.  He is concerned that his blood pressure can elevate up to 160 when he is up and about.  He also has some concerns about his heart rate which at times can measure as low as 38 bpm.  He has conduction system disease and sinus node dysfunction.  He has not had syncope or near syncope.  I have spoken to him about the treatment for bradycardia being permanent pacemaker insertion.  At his age he is reluctant to have an invasive procedure however we did discuss the procedure today and pointed out that it may become necessary even at his age.  He will consider this.  He prefers not to wear a  monitor again.  He feels he is doing okay now and wants to take a wait and see approach.  He denies angina.  The patient does not have symptoms concerning for COVID-19 infection (fever, chills, cough, or new shortness of breath).    Past Medical History:  Diagnosis Date  . Atrial fibrillation ([place])   . CAD (coronary artery disease)   . Diverticulosis   . Hypercholesteremia   . Hypertension   . Hypothyroidism   . Murmur   . Paroxysmal atrial fibrillation [facility])    Past Surgical History:  Procedure Laterality Date  . BYPASS GRAFT    . HEMORRHOID SURGERY    . HERNIA REPAIR    . HIP SURGERY       No outpatient medications have been marked as taking for the 02/05/19 encounter (Appointment) with Belva Crome, MD.     Allergies:   Shellfish allergy   Social History   Tobacco Use  . Smoking status: Former Research scientist (life sciences)  . Smokeless tobacco: Never Used  Substance Use Topics  . Alcohol use: Yes    Alcohol/week: 0.0 standard drinks  . Drug use: No     Family Hx: The patient's family history includes Heart disease in his mother.  ROS:   Please see the history of present illness.    Has difficulty communicating since his stroke.  He is still ambulatory.  He monitors vital signs on a regular basis.  He quotes that activity and moving about can raise his blood pressure to 160 mmHg  systolic.  Heart rates vary between 40 and 60 bpm.  He measured a heart rate of 38 today.  In reviewing a continuous monitor performed 1 year ago, there were heart rates in the mid 30s documented at that time, mostly during times when he was asleep. All other systems reviewed and are negative.   Prior CV studies:   The following studies were reviewed today:  No new CV testing or imaging.  Offered a repeat continuous monitor for 3 to 7 days which he is reluctant to do since he is having no real symptoms related to his heart rate.  Labs/Other Tests and Data Reviewed:    EKG:  No ECG reviewed.  Recent  Labs: No results found for requested labs within last 8760 hours.   Recent Lipid Panel No results found for: CHOL, TRIG, HDL, CHOLHDL, LDLCALC, LDLDIRECT  Wt Readings from Last 3 Encounters:  02/28/18 157 lb (71.2 kg)  01/03/18 156 lb 12.8 oz (71.1 kg)  04/08/17 161 lb 12.8 oz (73.4 kg)     Objective:    Vital Signs:  There were no vitals taken for this visit.   VITAL SIGNS:  reviewed  ASSESSMENT & PLAN:    1. Coronary artery disease involving native coronary artery of native heart without angina pectoris   2. Paroxysmal atrial fibrillation (HCC)   3. Nonrheumatic aortic valve stenosis   4. Essential hypertension   5. Cerebrovascular accident (CVA), unspecified mechanism ([place])   6. Hypercholesteremia   7. Educated About Covid-19 Virus Infection    PLAN:  1. No angina.  Secondary prevention. 2. His updated diagnosis is that of tachycardia-bradycardia syndrome.  We spent significant time discussing pacemaker therapy.  Daughter was present for the conversation.  He wants a wait and see approach rather than consenting to therapy that may not be needed. 3. Not assessed 4. Adequate blood pressure for his age.  Hopefully we will be able to keep his systolic blood pressures generally less than 145 mmHg. 5. Speech implies that he has permanent residua from last year's stroke. 6. Target LDL 70 or less. 7. Masking, watching, and social distancing is encouraged.  COVID-19 Education: The signs and symptoms of COVID-19 were discussed with the patient and how to seek care for testing (follow up with PCP or arrange E-visit).  The importance of social distancing was discussed today.  Time:   Today, I have spent 15 minutes with the patient with telehealth technology discussing the above problems.     Medication Adjustments/Labs and Tests Ordered: Current medicines are reviewed at length with the patient today.  Concerns regarding medicines are outlined above.   Tests Ordered: No orders  of the defined types were placed in this encounter.   Medication Changes: No orders of the defined types were placed in this encounter.   Follow Up:  In Person in 6 month(s)  Signed, Sinclair Grooms, MD  02/04/2019 7:20 PM    [place]

## 2019-02-05 ENCOUNTER — Other Ambulatory Visit: Payer: Self-pay

## 2019-02-05 ENCOUNTER — Encounter: Payer: Self-pay | Admitting: Interventional Cardiology

## 2019-02-05 ENCOUNTER — Telehealth (INDEPENDENT_AMBULATORY_CARE_PROVIDER_SITE_OTHER): Payer: Medicare Other | Admitting: Interventional Cardiology

## 2019-02-05 VITALS — BP 146/68 | HR 38 | Ht 67.0 in | Wt 146.0 lb

## 2019-02-05 DIAGNOSIS — I639 Cerebral infarction, unspecified: Secondary | ICD-10-CM

## 2019-02-05 DIAGNOSIS — I2510 Atherosclerotic heart disease of native coronary artery without angina pectoris: Secondary | ICD-10-CM

## 2019-02-05 DIAGNOSIS — E7800 Pure hypercholesterolemia, unspecified: Secondary | ICD-10-CM

## 2019-02-05 DIAGNOSIS — I69328 Other speech and language deficits following cerebral infarction: Secondary | ICD-10-CM | POA: Diagnosis not present

## 2019-02-05 DIAGNOSIS — I10 Essential (primary) hypertension: Secondary | ICD-10-CM | POA: Diagnosis not present

## 2019-02-05 DIAGNOSIS — I495 Sick sinus syndrome: Secondary | ICD-10-CM | POA: Diagnosis not present

## 2019-02-05 DIAGNOSIS — I480 Paroxysmal atrial fibrillation: Secondary | ICD-10-CM | POA: Diagnosis not present

## 2019-02-05 DIAGNOSIS — I350 Nonrheumatic aortic (valve) stenosis: Secondary | ICD-10-CM | POA: Diagnosis not present

## 2019-02-05 DIAGNOSIS — Z7189 Other specified counseling: Secondary | ICD-10-CM

## 2019-02-05 NOTE — Patient Instructions (Signed)

## 2019-03-05 DIAGNOSIS — R300 Dysuria: Secondary | ICD-10-CM | POA: Diagnosis not present

## 2019-03-09 DIAGNOSIS — I6389 Other cerebral infarction: Secondary | ICD-10-CM | POA: Diagnosis not present

## 2019-03-09 DIAGNOSIS — R5381 Other malaise: Secondary | ICD-10-CM | POA: Diagnosis not present

## 2019-03-09 DIAGNOSIS — H9190 Unspecified hearing loss, unspecified ear: Secondary | ICD-10-CM | POA: Diagnosis not present

## 2019-03-09 DIAGNOSIS — R269 Unspecified abnormalities of gait and mobility: Secondary | ICD-10-CM | POA: Diagnosis not present

## 2019-03-09 DIAGNOSIS — I2510 Atherosclerotic heart disease of native coronary artery without angina pectoris: Secondary | ICD-10-CM | POA: Diagnosis not present

## 2019-03-12 DIAGNOSIS — R7301 Impaired fasting glucose: Secondary | ICD-10-CM | POA: Diagnosis not present

## 2019-03-12 DIAGNOSIS — I10 Essential (primary) hypertension: Secondary | ICD-10-CM | POA: Diagnosis not present

## 2019-03-12 DIAGNOSIS — Z23 Encounter for immunization: Secondary | ICD-10-CM | POA: Diagnosis not present

## 2019-03-12 DIAGNOSIS — E038 Other specified hypothyroidism: Secondary | ICD-10-CM | POA: Diagnosis not present

## 2019-03-15 DIAGNOSIS — M47812 Spondylosis without myelopathy or radiculopathy, cervical region: Secondary | ICD-10-CM | POA: Diagnosis not present

## 2019-03-15 DIAGNOSIS — K5900 Constipation, unspecified: Secondary | ICD-10-CM | POA: Diagnosis not present

## 2019-03-15 DIAGNOSIS — M47813 Spondylosis without myelopathy or radiculopathy, cervicothoracic region: Secondary | ICD-10-CM | POA: Diagnosis not present

## 2019-03-15 DIAGNOSIS — R3911 Hesitancy of micturition: Secondary | ICD-10-CM | POA: Diagnosis not present

## 2019-03-15 DIAGNOSIS — M47814 Spondylosis without myelopathy or radiculopathy, thoracic region: Secondary | ICD-10-CM | POA: Diagnosis not present

## 2019-03-15 DIAGNOSIS — I129 Hypertensive chronic kidney disease with stage 1 through stage 4 chronic kidney disease, or unspecified chronic kidney disease: Secondary | ICD-10-CM | POA: Diagnosis not present

## 2019-03-15 DIAGNOSIS — I352 Nonrheumatic aortic (valve) stenosis with insufficiency: Secondary | ICD-10-CM | POA: Diagnosis not present

## 2019-03-15 DIAGNOSIS — M47818 Spondylosis without myelopathy or radiculopathy, sacral and sacrococcygeal region: Secondary | ICD-10-CM | POA: Diagnosis not present

## 2019-03-15 DIAGNOSIS — N189 Chronic kidney disease, unspecified: Secondary | ICD-10-CM | POA: Diagnosis not present

## 2019-03-15 DIAGNOSIS — M47816 Spondylosis without myelopathy or radiculopathy, lumbar region: Secondary | ICD-10-CM | POA: Diagnosis not present

## 2019-03-15 DIAGNOSIS — E871 Hypo-osmolality and hyponatremia: Secondary | ICD-10-CM | POA: Diagnosis not present

## 2019-03-15 DIAGNOSIS — I69328 Other speech and language deficits following cerebral infarction: Secondary | ICD-10-CM | POA: Diagnosis not present

## 2019-03-15 DIAGNOSIS — M47815 Spondylosis without myelopathy or radiculopathy, thoracolumbar region: Secondary | ICD-10-CM | POA: Diagnosis not present

## 2019-03-15 DIAGNOSIS — I2510 Atherosclerotic heart disease of native coronary artery without angina pectoris: Secondary | ICD-10-CM | POA: Diagnosis not present

## 2019-03-15 DIAGNOSIS — H9190 Unspecified hearing loss, unspecified ear: Secondary | ICD-10-CM | POA: Diagnosis not present

## 2019-03-15 DIAGNOSIS — R7301 Impaired fasting glucose: Secondary | ICD-10-CM | POA: Diagnosis not present

## 2019-03-15 DIAGNOSIS — I4891 Unspecified atrial fibrillation: Secondary | ICD-10-CM | POA: Diagnosis not present

## 2019-03-15 DIAGNOSIS — R471 Dysarthria and anarthria: Secondary | ICD-10-CM | POA: Diagnosis not present

## 2019-03-15 DIAGNOSIS — E7800 Pure hypercholesterolemia, unspecified: Secondary | ICD-10-CM | POA: Diagnosis not present

## 2019-03-15 DIAGNOSIS — M47817 Spondylosis without myelopathy or radiculopathy, lumbosacral region: Secondary | ICD-10-CM | POA: Diagnosis not present

## 2019-03-15 DIAGNOSIS — M1990 Unspecified osteoarthritis, unspecified site: Secondary | ICD-10-CM | POA: Diagnosis not present

## 2019-03-15 DIAGNOSIS — R350 Frequency of micturition: Secondary | ICD-10-CM | POA: Diagnosis not present

## 2019-03-15 DIAGNOSIS — M519 Unspecified thoracic, thoracolumbar and lumbosacral intervertebral disc disorder: Secondary | ICD-10-CM | POA: Diagnosis not present

## 2019-03-15 DIAGNOSIS — R32 Unspecified urinary incontinence: Secondary | ICD-10-CM | POA: Diagnosis not present

## 2019-03-15 DIAGNOSIS — M47811 Spondylosis without myelopathy or radiculopathy, occipito-atlanto-axial region: Secondary | ICD-10-CM | POA: Diagnosis not present

## 2019-03-19 DIAGNOSIS — M47814 Spondylosis without myelopathy or radiculopathy, thoracic region: Secondary | ICD-10-CM | POA: Diagnosis not present

## 2019-03-19 DIAGNOSIS — M47811 Spondylosis without myelopathy or radiculopathy, occipito-atlanto-axial region: Secondary | ICD-10-CM | POA: Diagnosis not present

## 2019-03-19 DIAGNOSIS — M47816 Spondylosis without myelopathy or radiculopathy, lumbar region: Secondary | ICD-10-CM | POA: Diagnosis not present

## 2019-03-19 DIAGNOSIS — M47812 Spondylosis without myelopathy or radiculopathy, cervical region: Secondary | ICD-10-CM | POA: Diagnosis not present

## 2019-03-19 DIAGNOSIS — M47815 Spondylosis without myelopathy or radiculopathy, thoracolumbar region: Secondary | ICD-10-CM | POA: Diagnosis not present

## 2019-03-19 DIAGNOSIS — M47813 Spondylosis without myelopathy or radiculopathy, cervicothoracic region: Secondary | ICD-10-CM | POA: Diagnosis not present

## 2019-03-22 DIAGNOSIS — M47814 Spondylosis without myelopathy or radiculopathy, thoracic region: Secondary | ICD-10-CM | POA: Diagnosis not present

## 2019-03-22 DIAGNOSIS — M47815 Spondylosis without myelopathy or radiculopathy, thoracolumbar region: Secondary | ICD-10-CM | POA: Diagnosis not present

## 2019-03-22 DIAGNOSIS — M47813 Spondylosis without myelopathy or radiculopathy, cervicothoracic region: Secondary | ICD-10-CM | POA: Diagnosis not present

## 2019-03-22 DIAGNOSIS — M47812 Spondylosis without myelopathy or radiculopathy, cervical region: Secondary | ICD-10-CM | POA: Diagnosis not present

## 2019-03-22 DIAGNOSIS — M47816 Spondylosis without myelopathy or radiculopathy, lumbar region: Secondary | ICD-10-CM | POA: Diagnosis not present

## 2019-03-22 DIAGNOSIS — M47811 Spondylosis without myelopathy or radiculopathy, occipito-atlanto-axial region: Secondary | ICD-10-CM | POA: Diagnosis not present

## 2019-03-26 DIAGNOSIS — M47816 Spondylosis without myelopathy or radiculopathy, lumbar region: Secondary | ICD-10-CM | POA: Diagnosis not present

## 2019-03-26 DIAGNOSIS — M47812 Spondylosis without myelopathy or radiculopathy, cervical region: Secondary | ICD-10-CM | POA: Diagnosis not present

## 2019-03-26 DIAGNOSIS — M47813 Spondylosis without myelopathy or radiculopathy, cervicothoracic region: Secondary | ICD-10-CM | POA: Diagnosis not present

## 2019-03-26 DIAGNOSIS — M47814 Spondylosis without myelopathy or radiculopathy, thoracic region: Secondary | ICD-10-CM | POA: Diagnosis not present

## 2019-03-26 DIAGNOSIS — M47811 Spondylosis without myelopathy or radiculopathy, occipito-atlanto-axial region: Secondary | ICD-10-CM | POA: Diagnosis not present

## 2019-03-26 DIAGNOSIS — M47815 Spondylosis without myelopathy or radiculopathy, thoracolumbar region: Secondary | ICD-10-CM | POA: Diagnosis not present

## 2019-03-27 DIAGNOSIS — I129 Hypertensive chronic kidney disease with stage 1 through stage 4 chronic kidney disease, or unspecified chronic kidney disease: Secondary | ICD-10-CM | POA: Diagnosis not present

## 2019-03-27 DIAGNOSIS — I6389 Other cerebral infarction: Secondary | ICD-10-CM | POA: Diagnosis not present

## 2019-03-27 DIAGNOSIS — Z0000 Encounter for general adult medical examination without abnormal findings: Secondary | ICD-10-CM | POA: Diagnosis not present

## 2019-03-27 DIAGNOSIS — R5381 Other malaise: Secondary | ICD-10-CM | POA: Diagnosis not present

## 2019-03-27 DIAGNOSIS — R269 Unspecified abnormalities of gait and mobility: Secondary | ICD-10-CM | POA: Diagnosis not present

## 2019-03-27 DIAGNOSIS — Z1331 Encounter for screening for depression: Secondary | ICD-10-CM | POA: Diagnosis not present

## 2019-03-27 DIAGNOSIS — N182 Chronic kidney disease, stage 2 (mild): Secondary | ICD-10-CM | POA: Diagnosis not present

## 2019-03-27 DIAGNOSIS — E871 Hypo-osmolality and hyponatremia: Secondary | ICD-10-CM | POA: Diagnosis not present

## 2019-03-27 DIAGNOSIS — D7589 Other specified diseases of blood and blood-forming organs: Secondary | ICD-10-CM | POA: Diagnosis not present

## 2019-03-27 DIAGNOSIS — I480 Paroxysmal atrial fibrillation: Secondary | ICD-10-CM | POA: Diagnosis not present

## 2019-03-27 DIAGNOSIS — I350 Nonrheumatic aortic (valve) stenosis: Secondary | ICD-10-CM | POA: Diagnosis not present

## 2019-03-27 DIAGNOSIS — J449 Chronic obstructive pulmonary disease, unspecified: Secondary | ICD-10-CM | POA: Diagnosis not present

## 2019-03-30 ENCOUNTER — Ambulatory Visit: Payer: BC Managed Care – PPO | Admitting: Podiatry

## 2019-04-04 DIAGNOSIS — M47815 Spondylosis without myelopathy or radiculopathy, thoracolumbar region: Secondary | ICD-10-CM | POA: Diagnosis not present

## 2019-04-04 DIAGNOSIS — M47813 Spondylosis without myelopathy or radiculopathy, cervicothoracic region: Secondary | ICD-10-CM | POA: Diagnosis not present

## 2019-04-04 DIAGNOSIS — M47816 Spondylosis without myelopathy or radiculopathy, lumbar region: Secondary | ICD-10-CM | POA: Diagnosis not present

## 2019-04-04 DIAGNOSIS — M47812 Spondylosis without myelopathy or radiculopathy, cervical region: Secondary | ICD-10-CM | POA: Diagnosis not present

## 2019-04-04 DIAGNOSIS — M47814 Spondylosis without myelopathy or radiculopathy, thoracic region: Secondary | ICD-10-CM | POA: Diagnosis not present

## 2019-04-04 DIAGNOSIS — M47811 Spondylosis without myelopathy or radiculopathy, occipito-atlanto-axial region: Secondary | ICD-10-CM | POA: Diagnosis not present

## 2019-04-06 DIAGNOSIS — M47815 Spondylosis without myelopathy or radiculopathy, thoracolumbar region: Secondary | ICD-10-CM | POA: Diagnosis not present

## 2019-04-06 DIAGNOSIS — M47813 Spondylosis without myelopathy or radiculopathy, cervicothoracic region: Secondary | ICD-10-CM | POA: Diagnosis not present

## 2019-04-06 DIAGNOSIS — M47811 Spondylosis without myelopathy or radiculopathy, occipito-atlanto-axial region: Secondary | ICD-10-CM | POA: Diagnosis not present

## 2019-04-06 DIAGNOSIS — M47816 Spondylosis without myelopathy or radiculopathy, lumbar region: Secondary | ICD-10-CM | POA: Diagnosis not present

## 2019-04-06 DIAGNOSIS — M47812 Spondylosis without myelopathy or radiculopathy, cervical region: Secondary | ICD-10-CM | POA: Diagnosis not present

## 2019-04-06 DIAGNOSIS — M47814 Spondylosis without myelopathy or radiculopathy, thoracic region: Secondary | ICD-10-CM | POA: Diagnosis not present

## 2019-04-09 DIAGNOSIS — M47811 Spondylosis without myelopathy or radiculopathy, occipito-atlanto-axial region: Secondary | ICD-10-CM | POA: Diagnosis not present

## 2019-04-09 DIAGNOSIS — M47814 Spondylosis without myelopathy or radiculopathy, thoracic region: Secondary | ICD-10-CM | POA: Diagnosis not present

## 2019-04-09 DIAGNOSIS — M47815 Spondylosis without myelopathy or radiculopathy, thoracolumbar region: Secondary | ICD-10-CM | POA: Diagnosis not present

## 2019-04-09 DIAGNOSIS — M47812 Spondylosis without myelopathy or radiculopathy, cervical region: Secondary | ICD-10-CM | POA: Diagnosis not present

## 2019-04-09 DIAGNOSIS — M47813 Spondylosis without myelopathy or radiculopathy, cervicothoracic region: Secondary | ICD-10-CM | POA: Diagnosis not present

## 2019-04-09 DIAGNOSIS — M47816 Spondylosis without myelopathy or radiculopathy, lumbar region: Secondary | ICD-10-CM | POA: Diagnosis not present

## 2019-04-12 DIAGNOSIS — M47816 Spondylosis without myelopathy or radiculopathy, lumbar region: Secondary | ICD-10-CM | POA: Diagnosis not present

## 2019-04-12 DIAGNOSIS — M47812 Spondylosis without myelopathy or radiculopathy, cervical region: Secondary | ICD-10-CM | POA: Diagnosis not present

## 2019-04-12 DIAGNOSIS — M47811 Spondylosis without myelopathy or radiculopathy, occipito-atlanto-axial region: Secondary | ICD-10-CM | POA: Diagnosis not present

## 2019-04-12 DIAGNOSIS — M47813 Spondylosis without myelopathy or radiculopathy, cervicothoracic region: Secondary | ICD-10-CM | POA: Diagnosis not present

## 2019-04-12 DIAGNOSIS — M47814 Spondylosis without myelopathy or radiculopathy, thoracic region: Secondary | ICD-10-CM | POA: Diagnosis not present

## 2019-04-12 DIAGNOSIS — M47815 Spondylosis without myelopathy or radiculopathy, thoracolumbar region: Secondary | ICD-10-CM | POA: Diagnosis not present

## 2019-04-14 DIAGNOSIS — M47815 Spondylosis without myelopathy or radiculopathy, thoracolumbar region: Secondary | ICD-10-CM | POA: Diagnosis not present

## 2019-04-14 DIAGNOSIS — M47814 Spondylosis without myelopathy or radiculopathy, thoracic region: Secondary | ICD-10-CM | POA: Diagnosis not present

## 2019-04-14 DIAGNOSIS — R32 Unspecified urinary incontinence: Secondary | ICD-10-CM | POA: Diagnosis not present

## 2019-04-14 DIAGNOSIS — M519 Unspecified thoracic, thoracolumbar and lumbosacral intervertebral disc disorder: Secondary | ICD-10-CM | POA: Diagnosis not present

## 2019-04-14 DIAGNOSIS — R7301 Impaired fasting glucose: Secondary | ICD-10-CM | POA: Diagnosis not present

## 2019-04-14 DIAGNOSIS — I69328 Other speech and language deficits following cerebral infarction: Secondary | ICD-10-CM | POA: Diagnosis not present

## 2019-04-14 DIAGNOSIS — M47812 Spondylosis without myelopathy or radiculopathy, cervical region: Secondary | ICD-10-CM | POA: Diagnosis not present

## 2019-04-14 DIAGNOSIS — M1990 Unspecified osteoarthritis, unspecified site: Secondary | ICD-10-CM | POA: Diagnosis not present

## 2019-04-14 DIAGNOSIS — M47818 Spondylosis without myelopathy or radiculopathy, sacral and sacrococcygeal region: Secondary | ICD-10-CM | POA: Diagnosis not present

## 2019-04-14 DIAGNOSIS — I352 Nonrheumatic aortic (valve) stenosis with insufficiency: Secondary | ICD-10-CM | POA: Diagnosis not present

## 2019-04-14 DIAGNOSIS — R471 Dysarthria and anarthria: Secondary | ICD-10-CM | POA: Diagnosis not present

## 2019-04-14 DIAGNOSIS — M47816 Spondylosis without myelopathy or radiculopathy, lumbar region: Secondary | ICD-10-CM | POA: Diagnosis not present

## 2019-04-14 DIAGNOSIS — I129 Hypertensive chronic kidney disease with stage 1 through stage 4 chronic kidney disease, or unspecified chronic kidney disease: Secondary | ICD-10-CM | POA: Diagnosis not present

## 2019-04-14 DIAGNOSIS — M47811 Spondylosis without myelopathy or radiculopathy, occipito-atlanto-axial region: Secondary | ICD-10-CM | POA: Diagnosis not present

## 2019-04-14 DIAGNOSIS — E871 Hypo-osmolality and hyponatremia: Secondary | ICD-10-CM | POA: Diagnosis not present

## 2019-04-14 DIAGNOSIS — N189 Chronic kidney disease, unspecified: Secondary | ICD-10-CM | POA: Diagnosis not present

## 2019-04-14 DIAGNOSIS — I2510 Atherosclerotic heart disease of native coronary artery without angina pectoris: Secondary | ICD-10-CM | POA: Diagnosis not present

## 2019-04-14 DIAGNOSIS — R3911 Hesitancy of micturition: Secondary | ICD-10-CM | POA: Diagnosis not present

## 2019-04-14 DIAGNOSIS — M47817 Spondylosis without myelopathy or radiculopathy, lumbosacral region: Secondary | ICD-10-CM | POA: Diagnosis not present

## 2019-04-14 DIAGNOSIS — R350 Frequency of micturition: Secondary | ICD-10-CM | POA: Diagnosis not present

## 2019-04-14 DIAGNOSIS — E7800 Pure hypercholesterolemia, unspecified: Secondary | ICD-10-CM | POA: Diagnosis not present

## 2019-04-14 DIAGNOSIS — I4891 Unspecified atrial fibrillation: Secondary | ICD-10-CM | POA: Diagnosis not present

## 2019-04-14 DIAGNOSIS — H9190 Unspecified hearing loss, unspecified ear: Secondary | ICD-10-CM | POA: Diagnosis not present

## 2019-04-14 DIAGNOSIS — K5900 Constipation, unspecified: Secondary | ICD-10-CM | POA: Diagnosis not present

## 2019-04-14 DIAGNOSIS — M47813 Spondylosis without myelopathy or radiculopathy, cervicothoracic region: Secondary | ICD-10-CM | POA: Diagnosis not present

## 2019-04-17 DIAGNOSIS — M47813 Spondylosis without myelopathy or radiculopathy, cervicothoracic region: Secondary | ICD-10-CM | POA: Diagnosis not present

## 2019-04-17 DIAGNOSIS — M47811 Spondylosis without myelopathy or radiculopathy, occipito-atlanto-axial region: Secondary | ICD-10-CM | POA: Diagnosis not present

## 2019-04-17 DIAGNOSIS — M47814 Spondylosis without myelopathy or radiculopathy, thoracic region: Secondary | ICD-10-CM | POA: Diagnosis not present

## 2019-04-17 DIAGNOSIS — M47815 Spondylosis without myelopathy or radiculopathy, thoracolumbar region: Secondary | ICD-10-CM | POA: Diagnosis not present

## 2019-04-17 DIAGNOSIS — M47816 Spondylosis without myelopathy or radiculopathy, lumbar region: Secondary | ICD-10-CM | POA: Diagnosis not present

## 2019-04-17 DIAGNOSIS — M47812 Spondylosis without myelopathy or radiculopathy, cervical region: Secondary | ICD-10-CM | POA: Diagnosis not present

## 2019-04-24 DIAGNOSIS — L03031 Cellulitis of right toe: Secondary | ICD-10-CM | POA: Diagnosis not present

## 2019-04-24 DIAGNOSIS — M47812 Spondylosis without myelopathy or radiculopathy, cervical region: Secondary | ICD-10-CM | POA: Diagnosis not present

## 2019-04-24 DIAGNOSIS — M2042 Other hammer toe(s) (acquired), left foot: Secondary | ICD-10-CM | POA: Diagnosis not present

## 2019-04-24 DIAGNOSIS — I739 Peripheral vascular disease, unspecified: Secondary | ICD-10-CM | POA: Diagnosis not present

## 2019-04-24 DIAGNOSIS — M47815 Spondylosis without myelopathy or radiculopathy, thoracolumbar region: Secondary | ICD-10-CM | POA: Diagnosis not present

## 2019-04-24 DIAGNOSIS — B351 Tinea unguium: Secondary | ICD-10-CM | POA: Diagnosis not present

## 2019-04-24 DIAGNOSIS — M2041 Other hammer toe(s) (acquired), right foot: Secondary | ICD-10-CM | POA: Diagnosis not present

## 2019-04-24 DIAGNOSIS — L03032 Cellulitis of left toe: Secondary | ICD-10-CM | POA: Diagnosis not present

## 2019-04-24 DIAGNOSIS — M47814 Spondylosis without myelopathy or radiculopathy, thoracic region: Secondary | ICD-10-CM | POA: Diagnosis not present

## 2019-04-24 DIAGNOSIS — M47813 Spondylosis without myelopathy or radiculopathy, cervicothoracic region: Secondary | ICD-10-CM | POA: Diagnosis not present

## 2019-04-24 DIAGNOSIS — M47811 Spondylosis without myelopathy or radiculopathy, occipito-atlanto-axial region: Secondary | ICD-10-CM | POA: Diagnosis not present

## 2019-04-24 DIAGNOSIS — M47816 Spondylosis without myelopathy or radiculopathy, lumbar region: Secondary | ICD-10-CM | POA: Diagnosis not present

## 2019-04-26 DIAGNOSIS — M47813 Spondylosis without myelopathy or radiculopathy, cervicothoracic region: Secondary | ICD-10-CM | POA: Diagnosis not present

## 2019-04-26 DIAGNOSIS — M47816 Spondylosis without myelopathy or radiculopathy, lumbar region: Secondary | ICD-10-CM | POA: Diagnosis not present

## 2019-04-26 DIAGNOSIS — M47814 Spondylosis without myelopathy or radiculopathy, thoracic region: Secondary | ICD-10-CM | POA: Diagnosis not present

## 2019-04-26 DIAGNOSIS — M47815 Spondylosis without myelopathy or radiculopathy, thoracolumbar region: Secondary | ICD-10-CM | POA: Diagnosis not present

## 2019-04-26 DIAGNOSIS — M47812 Spondylosis without myelopathy or radiculopathy, cervical region: Secondary | ICD-10-CM | POA: Diagnosis not present

## 2019-04-26 DIAGNOSIS — M47811 Spondylosis without myelopathy or radiculopathy, occipito-atlanto-axial region: Secondary | ICD-10-CM | POA: Diagnosis not present

## 2019-05-01 ENCOUNTER — Other Ambulatory Visit: Payer: Self-pay | Admitting: Interventional Cardiology

## 2019-05-01 DIAGNOSIS — M47811 Spondylosis without myelopathy or radiculopathy, occipito-atlanto-axial region: Secondary | ICD-10-CM | POA: Diagnosis not present

## 2019-05-01 DIAGNOSIS — M47815 Spondylosis without myelopathy or radiculopathy, thoracolumbar region: Secondary | ICD-10-CM | POA: Diagnosis not present

## 2019-05-01 DIAGNOSIS — M47814 Spondylosis without myelopathy or radiculopathy, thoracic region: Secondary | ICD-10-CM | POA: Diagnosis not present

## 2019-05-01 DIAGNOSIS — M47816 Spondylosis without myelopathy or radiculopathy, lumbar region: Secondary | ICD-10-CM | POA: Diagnosis not present

## 2019-05-01 DIAGNOSIS — M47813 Spondylosis without myelopathy or radiculopathy, cervicothoracic region: Secondary | ICD-10-CM | POA: Diagnosis not present

## 2019-05-01 DIAGNOSIS — M47812 Spondylosis without myelopathy or radiculopathy, cervical region: Secondary | ICD-10-CM | POA: Diagnosis not present

## 2019-05-08 DIAGNOSIS — M47814 Spondylosis without myelopathy or radiculopathy, thoracic region: Secondary | ICD-10-CM | POA: Diagnosis not present

## 2019-05-08 DIAGNOSIS — M47816 Spondylosis without myelopathy or radiculopathy, lumbar region: Secondary | ICD-10-CM | POA: Diagnosis not present

## 2019-05-08 DIAGNOSIS — M47811 Spondylosis without myelopathy or radiculopathy, occipito-atlanto-axial region: Secondary | ICD-10-CM | POA: Diagnosis not present

## 2019-05-08 DIAGNOSIS — M47813 Spondylosis without myelopathy or radiculopathy, cervicothoracic region: Secondary | ICD-10-CM | POA: Diagnosis not present

## 2019-05-08 DIAGNOSIS — M47815 Spondylosis without myelopathy or radiculopathy, thoracolumbar region: Secondary | ICD-10-CM | POA: Diagnosis not present

## 2019-05-08 DIAGNOSIS — M47812 Spondylosis without myelopathy or radiculopathy, cervical region: Secondary | ICD-10-CM | POA: Diagnosis not present

## 2019-06-18 DIAGNOSIS — Z23 Encounter for immunization: Secondary | ICD-10-CM | POA: Diagnosis not present

## 2019-07-16 DIAGNOSIS — Z23 Encounter for immunization: Secondary | ICD-10-CM | POA: Diagnosis not present

## 2019-07-30 DIAGNOSIS — S0501XA Injury of conjunctiva and corneal abrasion without foreign body, right eye, initial encounter: Secondary | ICD-10-CM | POA: Diagnosis not present

## 2019-07-30 DIAGNOSIS — H00022 Hordeolum internum right lower eyelid: Secondary | ICD-10-CM | POA: Diagnosis not present

## 2019-07-30 DIAGNOSIS — H0100A Unspecified blepharitis right eye, upper and lower eyelids: Secondary | ICD-10-CM | POA: Diagnosis not present

## 2019-08-01 DIAGNOSIS — S0502XD Injury of conjunctiva and corneal abrasion without foreign body, left eye, subsequent encounter: Secondary | ICD-10-CM | POA: Diagnosis not present

## 2019-08-01 DIAGNOSIS — H00022 Hordeolum internum right lower eyelid: Secondary | ICD-10-CM | POA: Diagnosis not present

## 2019-08-21 DIAGNOSIS — E871 Hypo-osmolality and hyponatremia: Secondary | ICD-10-CM | POA: Diagnosis not present

## 2019-08-21 DIAGNOSIS — R5381 Other malaise: Secondary | ICD-10-CM | POA: Diagnosis not present

## 2019-08-21 DIAGNOSIS — Z8673 Personal history of transient ischemic attack (TIA), and cerebral infarction without residual deficits: Secondary | ICD-10-CM | POA: Diagnosis not present

## 2019-08-21 DIAGNOSIS — I129 Hypertensive chronic kidney disease with stage 1 through stage 4 chronic kidney disease, or unspecified chronic kidney disease: Secondary | ICD-10-CM | POA: Diagnosis not present

## 2019-08-21 DIAGNOSIS — I480 Paroxysmal atrial fibrillation: Secondary | ICD-10-CM | POA: Diagnosis not present

## 2019-08-21 DIAGNOSIS — N182 Chronic kidney disease, stage 2 (mild): Secondary | ICD-10-CM | POA: Diagnosis not present

## 2019-08-21 DIAGNOSIS — R7301 Impaired fasting glucose: Secondary | ICD-10-CM | POA: Diagnosis not present

## 2019-08-21 DIAGNOSIS — J449 Chronic obstructive pulmonary disease, unspecified: Secondary | ICD-10-CM | POA: Diagnosis not present

## 2019-08-21 DIAGNOSIS — R269 Unspecified abnormalities of gait and mobility: Secondary | ICD-10-CM | POA: Diagnosis not present

## 2019-08-21 DIAGNOSIS — D6869 Other thrombophilia: Secondary | ICD-10-CM | POA: Diagnosis not present

## 2019-08-21 DIAGNOSIS — I350 Nonrheumatic aortic (valve) stenosis: Secondary | ICD-10-CM | POA: Diagnosis not present

## 2019-08-21 DIAGNOSIS — E039 Hypothyroidism, unspecified: Secondary | ICD-10-CM | POA: Diagnosis not present

## 2019-08-27 DIAGNOSIS — D485 Neoplasm of uncertain behavior of skin: Secondary | ICD-10-CM | POA: Diagnosis not present

## 2019-08-27 DIAGNOSIS — L570 Actinic keratosis: Secondary | ICD-10-CM | POA: Diagnosis not present

## 2019-08-27 DIAGNOSIS — Z85828 Personal history of other malignant neoplasm of skin: Secondary | ICD-10-CM | POA: Diagnosis not present

## 2019-08-27 DIAGNOSIS — D3611 Benign neoplasm of peripheral nerves and autonomic nervous system of face, head, and neck: Secondary | ICD-10-CM | POA: Diagnosis not present

## 2019-08-27 DIAGNOSIS — D044 Carcinoma in situ of skin of scalp and neck: Secondary | ICD-10-CM | POA: Diagnosis not present

## 2019-08-27 DIAGNOSIS — L814 Other melanin hyperpigmentation: Secondary | ICD-10-CM | POA: Diagnosis not present

## 2019-09-21 DIAGNOSIS — E038 Other specified hypothyroidism: Secondary | ICD-10-CM | POA: Diagnosis not present

## 2019-09-21 DIAGNOSIS — R7301 Impaired fasting glucose: Secondary | ICD-10-CM | POA: Diagnosis not present

## 2019-09-21 DIAGNOSIS — I10 Essential (primary) hypertension: Secondary | ICD-10-CM | POA: Diagnosis not present

## 2019-10-31 NOTE — Progress Notes (Addendum)
CARDIOLOGY OFFICE NOTE  Date:  11/07/2019    Billy Barnes Date of Birth: Oct 21, 1920 Medical Record [id number]  PCP:  Billy Infante, MD  Cardiologist:  Billy Barnes    Chief Complaint  Patient presents with  . Follow-up    Seen for Dr. Tamala Barnes    History of Present Illness: Billy Barnes is a 86 y.o. male who presents today for a 9 month check. Seen for Dr. Tamala Barnes.   He has a history of CAD with remote CABG, AS, PAF, HTN, presumed colonic diverticularhemorrhage in 2002 requiring transfusion, embolic CVA [id number], and hyperlipidemia.  He was last seen by a telehealth visit with Dr. Tamala Barnes last August - was concerned about elevated BP and low HR intermittently. He has been reluctant to have invasive procedures - preferred to not wear a monitor and overall wanted to take a "wait and see approach".   The patient does not have symptoms concerning for COVID-19 infection (fever, chills, cough, or new shortness of breath).   Comes in today. Here with his caregiver Billy Barnes today - she is from [employer] - she helps with the history. He says he is doing ok. Has some cough intermittently and coughs up some phlegm at times - otherwise he feels like he is doing ok. Says he knows his heart is irregular. Has had more swelling. Weight is up since last in office visit here. He does elevate his legs. He wishes to be on less medicines. Very little walking - no falls noted.   Past Medical History:  Diagnosis Date  . Atrial fibrillation ([place])   . CAD (coronary artery disease)   . Diverticulosis   . Hypercholesteremia   . Hypertension   . Hypothyroidism   . Murmur   . Paroxysmal atrial fibrillation [facility])     Past Surgical History:  Procedure Laterality Date  . BYPASS GRAFT    . HEMORRHOID SURGERY    . HERNIA REPAIR    . HIP SURGERY       Medications: Current Meds  Medication Sig  . amLODipine (NORVASC) 2.5 MG tablet Take 2.5 mg by mouth daily.  Marland Kitchen aspirin 325 MG tablet Take 1/2 tablet by  mouth daily  . atorvastatin (LIPITOR) 20 MG tablet Take 20 mg by mouth daily.  . cholecalciferol (VITAMIN D) 1000 UNITS tablet Take 1,000 Units by mouth daily.  Marland Kitchen CILOXAN 0.3 % ophthalmic ointment daily.  Marland Kitchen levothyroxine (SYNTHROID, LEVOTHROID) 25 MCG tablet Take 25 mcg by mouth daily before breakfast.  . Multiple Vitamins-Minerals (ICAPS AREDS FORMULA PO) Take 1 capsule by mouth daily.  Marland Kitchen MYRBETRIQ 50 MG TB24 tablet Take 50 mg by mouth daily.  Marland Kitchen telmisartan-hydrochlorothiazide (MICARDIS HCT) 80-12.5 MG tablet TAKE 1/2 TABLET EVERY DAY.  Marland Kitchen zolpidem (AMBIEN) 10 MG tablet Take 5 mg by mouth at bedtime as needed for sleep.     Allergies: Allergies  Allergen Reactions  . Shellfish Allergy Swelling and Rash    Social History: The patient  reports that he has quit smoking. He has never used smokeless tobacco. He reports current alcohol use. He reports that he does not use drugs.   Family History: The patient's family history includes Heart disease in his mother.   Review of Systems: Please see the history of present illness.   All other systems are reviewed and negative.   Physical Exam: VS:  BP (!) 122/58   Pulse (!) 46   Ht 5\' 7"  (1.702 m)   Wt 166 lb (75.3  kg)   SpO2 99%   BMI 26.00 kg/m  .  BMI Body mass index is 26 kg/m.  Wt Readings from Last 3 Encounters:  11/07/19 166 lb (75.3 kg)  02/05/19 146 lb (66.2 kg)  02/28/18 157 lb (71.2 kg)    General: Pleasant. Elderly. Alert and in no acute distress. Little speech impairment but he is very much alert and oriented and gives a good history.   Cardiac: Irregular - HR is slow - he has a harsh outflow murmur. He has over 2+ bilateral edema noted.  Respiratory:  Lungs with some crackles in the bases - he is quite kyphotic.  GI: Soft and nontender.  MS: No deformity or atrophy. Gait not tested.  Skin: Warm and dry. Color is normal.  Neuro:  Strength and sensation are intact and no gross focal deficits noted.  Psych: Alert,  appropriate and with normal affect.   LABORATORY DATA:  EKG:  EKG is ordered today.  Personally reviewed by me. This demonstrates atrial flutter with slow VR - HR is 45 - reviewed with Dr. Tamala Barnes today.  Lab Results  Component Value Date   WBC 7.3 03/04/2007   HGB 16.3 06/03/2010   HCT 48.0 06/03/2010   PLT 161 03/04/2007   GLUCOSE 104 (H) 06/03/2010   ALT 15 03/04/2007   AST 31 03/04/2007   NA 132 (L) 06/03/2010   K 4.1 06/03/2010   CL 95 (L) 03/04/2007   CREATININE 1.19 03/04/2007   BUN 13 03/04/2007   CO2 27 03/04/2007   INR 1.5 03/04/2007       BNP (last 3 results) No results for input(s): BNP in the last 8760 hours.  ProBNP (last 3 results) No results for input(s): PROBNP in the last 8760 hours.   Other Studies Reviewed Today:  Monitor Study Highlights 12/2017   Basic rhythm is sinus with sinus bradycardia.  Occasional atrial fibrillation with controlled rate.  No symptoms/events recorded.     Echo Study Conclusions 2018  - Left ventricle: The cavity size was normal. There was moderate  focal basal and mild concentric hypertrophy. Systolic function  was normal. The estimated ejection fraction was in the range of  55% to 60%. Wall motion was normal; there were no regional wall  motion abnormalities. There was an increased relative  contribution of atrial contraction to ventricular filling.  Doppler parameters are consistent with abnormal left ventricular  relaxation (grade 1 diastolic dysfunction).  - Aortic valve: There was moderate stenosis. There was mild  regurgitation. Mean gradient (S): 17 mm Hg. Peak gradient (S): 29  mm Hg. Valve area (VTI): 1.16 cm^2. Valve area (Vmax): 1.2 cm^2.  Valve area (Vmean): 1.19 cm^2.  - Mitral valve: Calcified annulus.  - Tricuspid valve: There was mild regurgitation.  - Pulmonic valve: There was trivial regurgitation.     ASSESSMENT & PLAN:    1. CAD - no active chest pain - would favor  conservative management.   2. PAF - he is in atrial flutter today - HR remains slow - he is not interested in any testing or PPM - he has had no syncope - not even dizzy. Not on any rate slowing medicines as well. Poor candidate for anticoagulation. Suspect he has some component of diastolic HF from this - adding low dose Lasix today.   3. AS - presume this is progressive - no cardinal symptoms noted. Would favor conservative therapy.   4. HTN - recheck by me is 140/60 - stopping Norvasc  today - this may help improve his swelling. Stopping HCT and using only plain ARB. Adding Lasix 20 mg a day. Lab today.   5. CVA - some speech impairment.   6. HLD - he is wanting to stop his statin - I am ok with this.   7. Probable tachy brady - now in atrial flutter - rate is slow - not interested in PPM or other testing.   8. COVID-19 Education: The signs and symptoms of COVID-19 were discussed with the patient and how to seek care for testing (follow up with PCP or arrange E-visit).  The importance of social distancing, staying at home, hand hygiene and wearing a mask when out in public were discussed today.  Current medicines are reviewed with the patient today.  The patient does not have concerns regarding medicines other than what has been noted above.  The following changes have been made:  See above.  Labs/ tests ordered today include:    Orders Placed This Encounter  Procedures  . EKG 12-Lead     Disposition:   FU with me in about a month. Will try low dose Lasix. Stopping Norvasc and Lipitor today. Changing to just plain ARB as well. Will update his daughter as well.    Patient is agreeable to this plan and will call if any problems develop in the interim.   SignedTruitt Merle, NP  11/07/2019 10:56 AM  [facility] [address] [place], [place]  [address] Phone: [phone] Fax: [phone]       Addendum:  I have called his  daughter to give update about plan of care. She has verified his medicines - he was not taking Norvasc. Unclear why this was on his list.  She is aware that Lipitor has been stopped. Micardis/HCT changed to just plain Micardis 80 mg a day and that low dose Lasix 20 mg has been added.   Burtis Junes, RN, [facility] [address] [place], [place]  [phone]

## 2019-11-07 ENCOUNTER — Encounter (INDEPENDENT_AMBULATORY_CARE_PROVIDER_SITE_OTHER): Payer: Self-pay

## 2019-11-07 ENCOUNTER — Encounter: Payer: Self-pay | Admitting: Nurse Practitioner

## 2019-11-07 ENCOUNTER — Ambulatory Visit (INDEPENDENT_AMBULATORY_CARE_PROVIDER_SITE_OTHER): Payer: Medicare Other | Admitting: Nurse Practitioner

## 2019-11-07 ENCOUNTER — Other Ambulatory Visit: Payer: Self-pay

## 2019-11-07 VITALS — BP 122/58 | HR 46 | Ht 67.0 in | Wt 166.0 lb

## 2019-11-07 DIAGNOSIS — I480 Paroxysmal atrial fibrillation: Secondary | ICD-10-CM | POA: Diagnosis not present

## 2019-11-07 DIAGNOSIS — I5032 Chronic diastolic (congestive) heart failure: Secondary | ICD-10-CM

## 2019-11-07 DIAGNOSIS — I639 Cerebral infarction, unspecified: Secondary | ICD-10-CM

## 2019-11-07 DIAGNOSIS — I10 Essential (primary) hypertension: Secondary | ICD-10-CM | POA: Diagnosis not present

## 2019-11-07 DIAGNOSIS — E7800 Pure hypercholesterolemia, unspecified: Secondary | ICD-10-CM | POA: Diagnosis not present

## 2019-11-07 DIAGNOSIS — I2510 Atherosclerotic heart disease of native coronary artery without angina pectoris: Secondary | ICD-10-CM

## 2019-11-07 LAB — BASIC METABOLIC PANEL
BUN/Creatinine Ratio: 17 (ref 10–24)
BUN: 21 mg/dL (ref 10–36)
CO2: 24 mmol/L (ref 20–29)
Calcium: 9.5 mg/dL (ref 8.6–10.2)
Chloride: 98 mmol/L (ref 96–106)
Creatinine, Ser: 1.23 mg/dL (ref 0.76–1.27)
GFR calc Af Amer: 57 mL/min/{1.73_m2} — ABNORMAL LOW (ref >59)
GFR calc non Af Amer: 49 mL/min/{1.73_m2} — ABNORMAL LOW (ref >59)
Glucose: 109 mg/dL — ABNORMAL HIGH (ref 65–99)
Potassium: 4.6 mmol/L (ref 3.5–5.2)
Sodium: 135 mmol/L (ref 134–144)

## 2019-11-07 MED ORDER — TELMISARTAN 80 MG PO TABS
80.0000 mg | ORAL_TABLET | Freq: Every day | ORAL | 3 refills | Status: DC
Start: 2019-11-07 — End: 2020-10-23

## 2019-11-07 MED ORDER — FUROSEMIDE 20 MG PO TABS
20.0000 mg | ORAL_TABLET | Freq: Every day | ORAL | 3 refills | Status: DC
Start: 2019-11-07 — End: 2020-10-23

## 2019-11-07 NOTE — Patient Instructions (Addendum)
After Visit Summary:  We will be checking the following labs today - BMET   Medication Instructions:    Continue with your current medicines. BUT  I am stopping Norvasc today  I am stopping Micardis HCT  I am adding back just the plain Micardis 80 mg to take one a day  I am adding Lasix 20 mg a day - this will help better with your swelling  I am stopping your Lipitor today.    If you need a refill on your cardiac medications before your next appointment, please call your pharmacy.     Testing/Procedures To Be Arranged:  N/A  Follow-Up:   See me in about a month with repeat EKG and lab    At [facility], you and your health needs are our priority.  As part of our continuing mission to provide you with exceptional heart care, we have created designated Provider Care Teams.  These Care Teams include your primary Cardiologist (physician) and Advanced Practice Providers (APPs -  Physician Assistants and Nurse Practitioners) who all work together to provide you with the care you need, when you need it.  Special Instructions:  . Stay safe, stay home, wash your hands for at least 20 seconds and wear a mask when out in public.  . It was good to talk with you today.    Call the [place] office at [phone] if you have any questions, problems or concerns.

## 2019-11-08 ENCOUNTER — Telehealth: Payer: Self-pay

## 2019-11-08 NOTE — Telephone Encounter (Signed)
The patient's daughter has been notified of the result and verbalized understanding.  All questions (if any) were answered. Frederik Schmidt, RN 11/08/2019 8:08 AM

## 2019-11-08 NOTE — Telephone Encounter (Signed)
-----   Message from Burtis Junes, NP sent at 11/07/2019  5:30 PM EDT ----- Ok to report. Billy Barnes is out of the office.  May need to speak to his daughter.  His labs are ok - would continue on current regimen.

## 2019-11-28 NOTE — Progress Notes (Signed)
CARDIOLOGY OFFICE NOTE  Date:  12/05/2019    [name] Date of Birth: August 25, 1920 Medical Record #[id number]  PCP:  Crist Infante, MD  Cardiologist:  Jennings Books    Chief Complaint  Patient presents with  . Follow-up    History of Present Illness: Billy Barnes is a 86 y.o. male who presents today for a one month check. Seen for Dr. Tamala Julian.   He has a history of CAD with remote CABG, AS, PAF, HTN, presumed colonic diverticularhemorrhage in 2002 requiring transfusion,embolic CVA 7253,GUY HLD.   He was last seen by a telehealth visit with Dr. Tamala Julian last August 2020 - was concerned about elevated BP and low HR intermittently. He has been reluctant to have invasive procedures - preferred to not wear a monitor and overall wanted to take a "wait and see approach".   I then saw him last month - some intermittent cough noted but otherwise he thought he was doing ok. He wanted to be on less medicines. Very little walking. Weight was up. He was noted to be in atrial flutter - with probably associated diastolic HF. I stopped his HCTZ, put him on plain ARB and low dose Lasix. Also stopped his Norvasc. He did not wish to be on statin anymore so I stopped that also. Not a candidate for anticoagulation.   The patient does not have symptoms concerning for COVID-19 infection (fever, chills, cough, or new shortness of breath).   Comes in today. Here with his caregiver - Almyra Free again today. He is doing ok. He feels his swelling has improved. He has no chest pain. Says his breathing is ok. Not dizzy. BP is fine. HR is controlled.   Past Medical History:  Diagnosis Date  . Atrial fibrillation ([place])   . CAD (coronary artery disease)   . Diverticulosis   . Hypercholesteremia   . Hypertension   . Hypothyroidism   . Murmur   . Paroxysmal atrial fibrillation [facility])     Past Surgical History:  Procedure Laterality Date  . BYPASS GRAFT    . HEMORRHOID SURGERY    . HERNIA REPAIR      . HIP SURGERY       Medications: Current Meds  Medication Sig  . aspirin 325 MG tablet Take 1/2 tablet by mouth daily  . cholecalciferol (VITAMIN D) 1000 UNITS tablet Take 1,000 Units by mouth daily.  Marland Kitchen CILOXAN 0.3 % ophthalmic ointment daily.  . furosemide (LASIX) 20 MG tablet Take 1 tablet (20 mg total) by mouth daily.  Marland Kitchen levothyroxine (SYNTHROID, LEVOTHROID) 25 MCG tablet Take 25 mcg by mouth daily before breakfast.  . Multiple Vitamins-Minerals (ICAPS AREDS FORMULA PO) Take 1 capsule by mouth daily.  Marland Kitchen MYRBETRIQ 50 MG TB24 tablet Take 50 mg by mouth daily.  Marland Kitchen telmisartan (MICARDIS) 80 MG tablet Take 1 tablet (80 mg total) by mouth daily.  Marland Kitchen zolpidem (AMBIEN) 10 MG tablet Take 5 mg by mouth at bedtime as needed for sleep.     Allergies: Allergies  Allergen Reactions  . Shellfish Allergy Swelling and Rash    Social History: The patient  reports that he has quit smoking. He has never used smokeless tobacco. He reports current alcohol use. He reports that he does not use drugs.   Family History: The patient's family history includes Heart disease in his mother.   Review of Systems: Please see the history of present illness.   All other systems are reviewed and negative.  Physical Exam: VS:  BP 114/74   Pulse 61   Ht 5\' 7"  (1.702 m)   Wt 164 lb 12.8 oz (74.8 kg)   BMI 25.81 kg/m  .  BMI Body mass index is 25.81 kg/m.  Wt Readings from Last 3 Encounters:  12/05/19 164 lb 12.8 oz (74.8 kg)  11/07/19 166 lb (75.3 kg)  02/05/19 146 lb (66.2 kg)    General: Elderly. Alert and in no acute distress. He is in a wheelchair. He is elderly but looks younger than his stated age - he looks better to me today.   Cardiac: Irregular irregular rhythm. His rate is ok. Harsh outflow murmur. Less dependent edema.  Respiratory:  Lungs are fairly clear to auscultation bilaterally with normal work of breathing.  GI: Soft and nontender.  MS: No deformity or atrophy. Gait not  tested. Skin: Warm and dry. Color is normal.  Neuro:  Strength and sensation are intact and no gross focal deficits noted.  Psych: Alert, appropriate and with normal affect.   LABORATORY DATA:  EKG:  EKG is ordered today.  Personally reviewed by me. This demonstrates atrial flutter - variable block - HR is 61.  Lab Results  Component Value Date   WBC 7.3 03/04/2007   HGB 16.3 06/03/2010   HCT 48.0 06/03/2010   PLT 161 03/04/2007   GLUCOSE 109 (H) 11/07/2019   ALT 15 03/04/2007   AST 31 03/04/2007   NA 135 11/07/2019   K 4.6 11/07/2019   CL 98 11/07/2019   CREATININE 1.23 11/07/2019   BUN 21 11/07/2019   CO2 24 11/07/2019   INR 1.5 03/04/2007       BNP (last 3 results) No results for input(s): BNP in the last 8760 hours.  ProBNP (last 3 results) No results for input(s): PROBNP in the last 8760 hours.   Other Studies Reviewed Today:  Monitor Study Highlights 12/2017   Basic rhythm is sinus with sinus bradycardia.  Occasional atrial fibrillation with controlled rate.  No symptoms/events recorded.    Echo Study Conclusions 2018  - Left ventricle: The cavity size was normal. There was moderate  focal basal and mild concentric hypertrophy. Systolic function  was normal. The estimated ejection fraction was in the range of  55% to 60%. Wall motion was normal; there were no regional wall  motion abnormalities. There was an increased relative  contribution of atrial contraction to ventricular filling.  Doppler parameters are consistent with abnormal left ventricular  relaxation (grade 1 diastolic dysfunction).  - Aortic valve: There was moderate stenosis. There was mild  regurgitation. Mean gradient (S): 17 mm Hg. Peak gradient (S): 29  mm Hg. Valve area (VTI): 1.16 cm^2. Valve area (Vmax): 1.2 cm^2.  Valve area (Vmean): 1.19 cm^2.  - Mitral valve: Calcified annulus.  - Tricuspid valve: There was mild regurgitation.  - Pulmonic valve:  There was trivial regurgitation.     ASSESSMENT & PLAN:   1. Atrial flutter with variable block - overall HR is ok - not on any rate slowing medicines. Poor candidate for anticoagulation. He has not been interested in any further testing or PPM.   2. CAD - managed medically - favor conservative approach - no worrisome symptoms.   3. AS - presume this progressive - he has no cardinal symptoms.   4. HTN - BP looks good with recent med changes.   5. Probable diastolic HF - probably due to being in atrial flutter - swelling looks better - recheck BMET.  6. Prior CVA with chronic speech impairment.   7. HLD - no longer on statin.   8. Probable tachy brady - now in atrial flutter - rate is ok today - see #1.   Current medicines are reviewed with the patient today.  The patient does not have concerns regarding medicines other than what has been noted above.  The following changes have been made:  See above.  Labs/ tests ordered today include:    Orders Placed This Encounter  Procedures  . Basic metabolic panel  . EKG 12-Lead     Disposition:   FU with [place] in 6 months. Daughter was updated by phone - she feels like he is doing better with the recent med changes and is happy with his current status.     Patient is agreeable to this plan and will call if any problems develop in the interim.   SignedTruitt Merle, NP  12/05/2019 11:19 AM  [facility] [address] [place], [place]  [address] Phone: [phone] Fax: [phone]

## 2019-11-30 ENCOUNTER — Other Ambulatory Visit: Payer: Self-pay

## 2019-11-30 ENCOUNTER — Ambulatory Visit (HOSPITAL_COMMUNITY)
Admission: RE | Admit: 2019-11-30 | Discharge: 2019-11-30 | Disposition: A | Discharge status: Home / Self Care | Payer: Medicare Other | Source: Ambulatory Visit | Attending: Vascular Surgery | Admitting: Vascular Surgery

## 2019-11-30 ENCOUNTER — Other Ambulatory Visit (HOSPITAL_COMMUNITY): Payer: Self-pay | Admitting: Internal Medicine

## 2019-11-30 ENCOUNTER — Ambulatory Visit (INDEPENDENT_AMBULATORY_CARE_PROVIDER_SITE_OTHER)
Admission: RE | Admit: 2019-11-30 | Discharge: 2019-11-30 | Disposition: A | Discharge status: Home / Self Care | Payer: Medicare Other | Source: Ambulatory Visit | Attending: Vascular Surgery | Admitting: Vascular Surgery

## 2019-11-30 DIAGNOSIS — I83813 Varicose veins of bilateral lower extremities with pain: Secondary | ICD-10-CM | POA: Diagnosis not present

## 2019-11-30 DIAGNOSIS — R52 Pain, unspecified: Secondary | ICD-10-CM | POA: Diagnosis not present

## 2019-12-05 ENCOUNTER — Other Ambulatory Visit: Payer: Self-pay

## 2019-12-05 ENCOUNTER — Encounter: Payer: Self-pay | Admitting: Nurse Practitioner

## 2019-12-05 ENCOUNTER — Ambulatory Visit (INDEPENDENT_AMBULATORY_CARE_PROVIDER_SITE_OTHER): Payer: Medicare Other | Admitting: Nurse Practitioner

## 2019-12-05 VITALS — BP 114/74 | HR 61 | Ht 67.0 in | Wt 164.8 lb

## 2019-12-05 DIAGNOSIS — I480 Paroxysmal atrial fibrillation: Secondary | ICD-10-CM

## 2019-12-05 DIAGNOSIS — I2510 Atherosclerotic heart disease of native coronary artery without angina pectoris: Secondary | ICD-10-CM | POA: Diagnosis not present

## 2019-12-05 DIAGNOSIS — I5032 Chronic diastolic (congestive) heart failure: Secondary | ICD-10-CM | POA: Diagnosis not present

## 2019-12-05 LAB — BASIC METABOLIC PANEL
BUN/Creatinine Ratio: 18 (ref 10–24)
BUN: 23 mg/dL (ref 10–36)
CO2: 24 mmol/L (ref 20–29)
Calcium: 9.5 mg/dL (ref 8.6–10.2)
Chloride: 99 mmol/L (ref 96–106)
Creatinine, Ser: 1.3 mg/dL — ABNORMAL HIGH (ref 0.76–1.27)
GFR calc Af Amer: 53 mL/min/{1.73_m2} — ABNORMAL LOW (ref >59)
GFR calc non Af Amer: 46 mL/min/{1.73_m2} — ABNORMAL LOW (ref >59)
Glucose: 108 mg/dL — ABNORMAL HIGH (ref 65–99)
Potassium: 4.2 mmol/L (ref 3.5–5.2)
Sodium: 136 mmol/L (ref 134–144)

## 2019-12-05 NOTE — Patient Instructions (Addendum)
After Visit Summary:  We will be checking the following labs today - BMET   Medication Instructions:    Continue with your current medicines.    If you need a refill on your cardiac medications before your next appointment, please call your pharmacy.     Testing/Procedures To Be Arranged:  N/A  Follow-Up:   See me in 6 months    At [facility], you and your health needs are our priority.  As part of our continuing mission to provide you with exceptional heart care, we have created designated Provider Care Teams.  These Care Teams include your primary Cardiologist (physician) and Advanced Practice Providers (APPs -  Physician Assistants and Nurse Practitioners) who all work together to provide you with the care you need, when you need it.  Special Instructions:  . Stay safe, wash your hands for at least 20 seconds and wear a mask when needed.   It was good to talk with you today.   I think you are doing good!   Call the [place] office at [phone] if you have any questions, problems or concerns.

## 2020-01-01 DIAGNOSIS — H906 Mixed conductive and sensorineural hearing loss, bilateral: Secondary | ICD-10-CM | POA: Diagnosis not present

## 2020-01-02 ENCOUNTER — Encounter: Payer: Self-pay | Admitting: *Deleted

## 2020-03-26 DIAGNOSIS — Z23 Encounter for immunization: Secondary | ICD-10-CM | POA: Diagnosis not present

## 2020-03-27 ENCOUNTER — Encounter: Payer: Self-pay | Admitting: Nurse Practitioner

## 2020-03-27 ENCOUNTER — Ambulatory Visit: Payer: Medicare Other | Admitting: Nurse Practitioner

## 2020-03-27 ENCOUNTER — Other Ambulatory Visit: Payer: Self-pay

## 2020-03-27 DIAGNOSIS — I809 Phlebitis and thrombophlebitis of unspecified site: Secondary | ICD-10-CM

## 2020-03-27 DIAGNOSIS — D6859 Other primary thrombophilia: Secondary | ICD-10-CM | POA: Insufficient documentation

## 2020-03-27 DIAGNOSIS — G4700 Insomnia, unspecified: Secondary | ICD-10-CM

## 2020-03-27 DIAGNOSIS — N183 Chronic kidney disease, stage 3 unspecified: Secondary | ICD-10-CM | POA: Insufficient documentation

## 2020-03-27 DIAGNOSIS — I480 Paroxysmal atrial fibrillation: Secondary | ICD-10-CM

## 2020-03-27 DIAGNOSIS — I6340 Cerebral infarction due to embolism of unspecified cerebral artery: Secondary | ICD-10-CM

## 2020-03-27 DIAGNOSIS — Z8719 Personal history of other diseases of the digestive system: Secondary | ICD-10-CM

## 2020-03-27 DIAGNOSIS — I2510 Atherosclerotic heart disease of native coronary artery without angina pectoris: Secondary | ICD-10-CM

## 2020-03-27 DIAGNOSIS — N1831 Chronic kidney disease, stage 3a: Secondary | ICD-10-CM | POA: Diagnosis not present

## 2020-03-27 DIAGNOSIS — E7800 Pure hypercholesterolemia, unspecified: Secondary | ICD-10-CM

## 2020-03-27 DIAGNOSIS — R001 Bradycardia, unspecified: Secondary | ICD-10-CM

## 2020-03-27 DIAGNOSIS — E039 Hypothyroidism, unspecified: Secondary | ICD-10-CM | POA: Diagnosis not present

## 2020-03-27 DIAGNOSIS — I270 Primary pulmonary hypertension: Secondary | ICD-10-CM | POA: Insufficient documentation

## 2020-03-27 DIAGNOSIS — R350 Frequency of micturition: Secondary | ICD-10-CM

## 2020-03-27 DIAGNOSIS — I10 Essential (primary) hypertension: Secondary | ICD-10-CM | POA: Diagnosis not present

## 2020-03-27 NOTE — Assessment & Plan Note (Addendum)
CAD, Coronary artery bypass grafting 1992, continue ASA, Fish oil

## 2020-03-27 NOTE — Assessment & Plan Note (Signed)
HR in 40s, underwent cardiology eval, no pacemaker.

## 2020-03-27 NOTE — Assessment & Plan Note (Signed)
Off Coumadin, takes ASA, will update CBC/diff.

## 2020-03-27 NOTE — Assessment & Plan Note (Signed)
CKD creat 1.3, eGFR 46 12/05/19

## 2020-03-27 NOTE — Assessment & Plan Note (Signed)
No recent TSH, will update

## 2020-03-27 NOTE — Assessment & Plan Note (Signed)
CVA, no apparent focal weakness.

## 2020-03-27 NOTE — Assessment & Plan Note (Signed)
Taking fish oil, will update lipid panel.

## 2020-03-27 NOTE — Assessment & Plan Note (Signed)
Urinary frequency, takes [place]

## 2020-03-27 NOTE — Assessment & Plan Note (Addendum)
had work up in the past, venous US negative DVT in legs.  

## 2020-03-27 NOTE — Assessment & Plan Note (Addendum)
Afib, heart rate is low,  EKG 12/05/19 Afib at cardiology. Update TSH

## 2020-03-27 NOTE — Assessment & Plan Note (Signed)
Blood pressure is controlled, continue Telmisartan

## 2020-03-27 NOTE — Assessment & Plan Note (Signed)
Insomnia, takes Zolpidem

## 2020-03-27 NOTE — Assessment & Plan Note (Addendum)
Pulmonary hypertension/edema BLE, takes Furosemide, US 11/30/19 negative DVT. U[date CMP/eGFR 

## 2020-03-27 NOTE — Progress Notes (Signed)
Location:   clinic [facility]   Place of Service:    Provider: Marlana Latus NP  Code Status: DNR Goals of Care: No flowsheet data found.   Chief Complaint  Patient presents with   Medical Management of Chronic Issues    Patient here today to establish care. He is here today with Billy Barnes, his daughter.    HPI: Patient is a 86 y.o. male seen today for medical management of chronic diseases.      Afib, heart rate is in control. EKG 12/05/19 Afib at cardiology, only takes ASA  Thrombophilia, had work up in the past, venous US negative DVT in legs.   Urinary frequency, takes Mirabegran  CAD, ASA, Fish oil  Hx of GI bleed, no recent H&H  CVA, no apparent focal weakness.   HTN, takes Telmisartan  Pulmonary hypertension/edema BLE, takes Furosemide, [place] 11/30/19 negative DVT  Insomnia, takes Zolpidem  CKD creat 1.3, eGFR 46 12/05/19    Past Medical History:  Diagnosis Date   Atrial fibrillation (HCC)    CAD (coronary artery disease)    Diverticulosis    Hypercholesteremia    Hypertension    Hypothyroidism    Insomnia    Murmur    Paroxysmal atrial fibrillation (HCC)    Urinary dysfunction     Past Surgical History:  Procedure Laterality Date   BYPASS GRAFT     HEMORRHOID SURGERY     HERNIA REPAIR     HIP SURGERY      Allergies  Allergen Reactions   Shellfish Allergy Swelling and Rash    Allergies as of 03/27/2020      Reactions   Shellfish Allergy Swelling, Rash      Medication List       Accurate as of March 27, 2020 11:59 PM. If you have any questions, ask your nurse or doctor.        aspirin 81 MG EC tablet in the morning and at bedtime.   cholecalciferol 1000 units tablet Commonly known as: VITAMIN D Take 1,000 Units by mouth daily.   furosemide 20 MG tablet Commonly known as: Lasix Take 1 tablet (20 mg total) by mouth daily.   levothyroxine 25 MCG tablet Commonly known as: SYNTHROID Take 25 mcg by mouth daily before breakfast.    Myrbetriq 50 MG Tb24 tablet Generic drug: mirabegron ER Take 50 mg by mouth daily.   Omega-3 1000 MG Caps Take by mouth in the morning and at bedtime.   PRESERVISION AREDS 2 PO Take by mouth daily.   CENTRUM SILVER PO Take by mouth daily.   PROBIOTIC PO Take by mouth daily.   telmisartan 80 MG tablet Commonly known as: Micardis Take 1 tablet (80 mg total) by mouth daily.   vitamin C 1000 MG tablet Take 1,000 mg by mouth in the morning and at bedtime.   zolpidem 10 MG tablet Commonly known as: AMBIEN Take 5 mg by mouth at bedtime as needed for sleep.       Review of Systems:  Review of Systems  Constitutional: Negative for activity change, appetite change and fever.  HENT: Positive for hearing loss. Negative for congestion and voice change.   Respiratory: Positive for shortness of breath. Negative for cough and wheezing.        Occasional nocturnal orthopnea   Cardiovascular: Positive for leg swelling. Negative for chest pain and palpitations.  Gastrointestinal: Negative for abdominal distention, abdominal pain, constipation, diarrhea, nausea and vomiting.  Genitourinary: Positive for frequency. Negative for  difficulty urinating and urgency.  Musculoskeletal: Positive for arthralgias and gait problem.  Neurological: Negative for dizziness, speech difficulty, weakness and headaches.  Psychiatric/Behavioral: Positive for sleep disturbance. Negative for agitation, confusion and hallucinations. The patient is not nervous/anxious.     Health Maintenance  Topic Date Due   PNA vac Low Risk Adult (2 of 2 - PCV13) 08/12/2013   TETANUS/TDAP  06/14/2014   INFLUENZA VACCINE  Completed   COVID-19 Vaccine  Completed    Physical Exam: Vitals:   03/27/20 1537  BP: (!) 126/58  Pulse: (!) 46  Temp: (!) 97.2 F (36.2 C)  SpO2: 100%  Weight: 165 lb 6.4 oz (75 kg)  Height: '5\' 7"'  (1.702 m)   Body mass index is 25.91 kg/m. Physical Exam Vitals and nursing note  reviewed.  Constitutional:      General: He is not in acute distress.    Appearance: Normal appearance. He is not ill-appearing, toxic-appearing or diaphoretic.  HENT:     Head: Normocephalic and atraumatic.     Nose: Nose normal.     Mouth/Throat:     Mouth: Mucous membranes are moist.  Eyes:     Extraocular Movements: Extraocular movements intact.     Conjunctiva/sclera: Conjunctivae normal.     Pupils: Pupils are equal, round, and reactive to light.  Cardiovascular:     Rate and Rhythm: Bradycardia present. Rhythm irregular.     Heart sounds: Murmur heard.   Pulmonary:     Breath sounds: Rales present. No wheezing or rhonchi.     Comments: Bibasilar rales.  Abdominal:     General: Bowel sounds are normal.     Palpations: Abdomen is soft.     Tenderness: There is no abdominal tenderness. There is no right CVA tenderness, left CVA tenderness, guarding or rebound.  Musculoskeletal:     Cervical back: Normal range of motion and neck supple.     Right lower leg: Edema present.     Left lower leg: Edema present.     Comments: 2-3 edema BLE, declined TED.   Skin:    General: Skin is warm and dry.     Comments: A small SK injury right shoulder blade.   Neurological:     General: No focal deficit present.     Mental Status: He is alert and oriented to person, place, and time. Mental status is at baseline.     Motor: Weakness present.     Coordination: Coordination normal.     Gait: Gait abnormal.     Comments: Left sided weakness form previous CVA, muscle strength 5/5  Psychiatric:        Mood and Affect: Mood normal.        Behavior: Behavior normal.        Thought Content: Thought content normal.        Judgment: Judgment normal.     Labs reviewed: Basic Metabolic Panel: Recent Labs    11/07/19 1106 12/05/19 1116  NA 135 136  K 4.6 4.2  CL 98 99  CO2 24 24  GLUCOSE 109* 108*  BUN 21 23  CREATININE 1.23 1.30*  CALCIUM 9.5 9.5   Liver Function Tests: No  results for input(s): AST, ALT, ALKPHOS, BILITOT, PROT, ALBUMIN in the last 8760 hours. No results for input(s): LIPASE, AMYLASE in the last 8760 hours. No results for input(s): AMMONIA in the last 8760 hours. CBC: No results for input(s): WBC, NEUTROABS, HGB, HCT, MCV, PLT in the last 8760 hours. Lipid  Panel: No results for input(s): CHOL, HDL, LDLCALC, TRIG, CHOLHDL, LDLDIRECT in the last 8760 hours. No results found for: HGBA1C  Procedures since last visit: No results found.  Assessment/Plan  Paroxysmal atrial fibrillation Afib, heart rate is low,  EKG 12/05/19 Afib at cardiology. Update TSH   Hypothyroidism No recent TSH, will update  Hypercholesteremia Taking fish oil, will update lipid panel.  Thrombophlebitis had work up in the past, venous US negative DVT in legs.   Urinary frequency Urinary frequency, takes Mirabegran   CAD (coronary artery disease) CAD, Coronary artery bypass grafting 1992, continue ASA, Fish oil  History of GI bleed Off Coumadin, takes ASA, will update CBC/diff.   CVA (cerebral vascular accident) ([place]) CVA, no apparent focal weakness.   Hypertension Blood pressure is controlled, continue Telmisartan  Pulmonary hypertension, primary ([place]) Pulmonary hypertension/edema BLE, takes Furosemide, [place] 11/30/19 negative DVT. U[date CMP/eGFR  Insomnia Insomnia, takes Zolpidem   CKD (chronic kidney disease) stage 3, GFR 30-59 ml/min (HCC) CKD creat 1.3, eGFR 46 12/05/19   Bradycardia HR in 40s, underwent cardiology eval, no pacemaker.    Labs/tests ordered:  CBC/diff, CMP/eGFR, lipid panel, TSH Next appt:  4-4 weeks with Dr. Lyndel Safe

## 2020-03-28 ENCOUNTER — Encounter: Payer: Self-pay | Admitting: Nurse Practitioner

## 2020-04-06 DIAGNOSIS — Z23 Encounter for immunization: Secondary | ICD-10-CM | POA: Diagnosis not present

## 2020-05-05 DIAGNOSIS — I270 Primary pulmonary hypertension: Secondary | ICD-10-CM | POA: Diagnosis not present

## 2020-05-05 DIAGNOSIS — I480 Paroxysmal atrial fibrillation: Secondary | ICD-10-CM | POA: Diagnosis not present

## 2020-05-05 DIAGNOSIS — E7800 Pure hypercholesterolemia, unspecified: Secondary | ICD-10-CM | POA: Diagnosis not present

## 2020-05-05 DIAGNOSIS — I809 Phlebitis and thrombophlebitis of unspecified site: Secondary | ICD-10-CM | POA: Diagnosis not present

## 2020-05-06 ENCOUNTER — Other Ambulatory Visit: Payer: Self-pay

## 2020-05-06 DIAGNOSIS — I809 Phlebitis and thrombophlebitis of unspecified site: Secondary | ICD-10-CM

## 2020-05-06 DIAGNOSIS — I480 Paroxysmal atrial fibrillation: Secondary | ICD-10-CM

## 2020-05-06 DIAGNOSIS — E7800 Pure hypercholesterolemia, unspecified: Secondary | ICD-10-CM

## 2020-05-06 DIAGNOSIS — I270 Primary pulmonary hypertension: Secondary | ICD-10-CM

## 2020-05-06 LAB — CBC WITH DIFFERENTIAL/PLATELET
Absolute Monocytes: 665 cells/uL (ref 200–950)
Basophils Absolute: 80 cells/uL (ref 0–200)
Basophils Relative: 1.6 %
Eosinophils Absolute: 525 cells/uL — ABNORMAL HIGH (ref 15–500)
Eosinophils Relative: 10.5 %
HCT: 41.9 % (ref 38.5–50.0)
Hemoglobin: 14.4 g/dL (ref 13.2–17.1)
Lymphs Abs: 1005 cells/uL (ref 850–3900)
MCH: 33.1 pg — ABNORMAL HIGH (ref 27.0–33.0)
MCHC: 34.4 g/dL (ref 32.0–36.0)
MCV: 96.3 fL (ref 80.0–100.0)
MPV: 11.9 fL (ref 7.5–12.5)
Monocytes Relative: 13.3 %
Neutro Abs: 2725 cells/uL (ref 1500–7800)
Neutrophils Relative %: 54.5 %
Platelets: 200 10*3/uL (ref 140–400)
RBC: 4.35 10*6/uL (ref 4.20–5.80)
RDW: 12.6 % (ref 11.0–15.0)
Total Lymphocyte: 20.1 %
WBC: 5 10*3/uL (ref 3.8–10.8)

## 2020-05-06 LAB — COMPLETE METABOLIC PANEL WITH GFR
AG Ratio: 1.2 (calc) (ref 1.0–2.5)
ALT: 12 U/L (ref 9–46)
AST: 15 U/L (ref 10–35)
Albumin: 3.4 g/dL — ABNORMAL LOW (ref 3.6–5.1)
Alkaline phosphatase (APISO): 58 U/L (ref 35–144)
BUN/Creatinine Ratio: 21 (calc) (ref 6–22)
BUN: 29 mg/dL — ABNORMAL HIGH (ref 7–25)
CO2: 26 mmol/L (ref 20–32)
Calcium: 9.1 mg/dL (ref 8.6–10.3)
Chloride: 99 mmol/L (ref 98–110)
Creat: 1.4 mg/dL — ABNORMAL HIGH (ref 0.70–1.11)
GFR, Est African American: 48 mL/min/{1.73_m2} — ABNORMAL LOW (ref >=60)
GFR, Est Non African American: 42 mL/min/{1.73_m2} — ABNORMAL LOW (ref >=60)
Globulin: 2.9 g/dL (calc) (ref 1.9–3.7)
Glucose, Bld: 100 mg/dL — ABNORMAL HIGH (ref 65–99)
Potassium: 4.7 mmol/L (ref 3.5–5.3)
Sodium: 133 mmol/L — ABNORMAL LOW (ref 135–146)
Total Bilirubin: 0.6 mg/dL (ref 0.2–1.2)
Total Protein: 6.3 g/dL (ref 6.1–8.1)

## 2020-05-06 LAB — LIPID PANEL
Cholesterol: 183 mg/dL (ref <200)
HDL: 54 mg/dL (ref >=40)
LDL Cholesterol (Calc): 107 mg/dL (calc) — ABNORMAL HIGH
Non-HDL Cholesterol (Calc): 129 mg/dL (calc) (ref <130)
Total CHOL/HDL Ratio: 3.4 (calc) (ref <5.0)
Triglycerides: 128 mg/dL (ref <150)

## 2020-05-06 LAB — TSH: TSH: 5.3 mIU/L — ABNORMAL HIGH (ref 0.40–4.50)

## 2020-05-16 ENCOUNTER — Other Ambulatory Visit: Payer: Self-pay

## 2020-05-16 ENCOUNTER — Non-Acute Institutional Stay: Payer: Medicare Other | Admitting: Internal Medicine

## 2020-05-16 ENCOUNTER — Encounter: Payer: Self-pay | Admitting: Internal Medicine

## 2020-05-16 VITALS — BP 124/72 | HR 58 | Temp 97.0°F | Ht 67.0 in | Wt 167.0 lb

## 2020-05-16 DIAGNOSIS — I480 Paroxysmal atrial fibrillation: Secondary | ICD-10-CM | POA: Diagnosis not present

## 2020-05-16 DIAGNOSIS — E7800 Pure hypercholesterolemia, unspecified: Secondary | ICD-10-CM | POA: Diagnosis not present

## 2020-05-16 DIAGNOSIS — N1832 Chronic kidney disease, stage 3b: Secondary | ICD-10-CM

## 2020-05-16 DIAGNOSIS — E039 Hypothyroidism, unspecified: Secondary | ICD-10-CM

## 2020-05-16 DIAGNOSIS — G4700 Insomnia, unspecified: Secondary | ICD-10-CM | POA: Diagnosis not present

## 2020-05-16 DIAGNOSIS — R600 Localized edema: Secondary | ICD-10-CM

## 2020-05-16 DIAGNOSIS — I6340 Cerebral infarction due to embolism of unspecified cerebral artery: Secondary | ICD-10-CM | POA: Diagnosis not present

## 2020-05-16 DIAGNOSIS — I2510 Atherosclerotic heart disease of native coronary artery without angina pectoris: Secondary | ICD-10-CM

## 2020-05-16 NOTE — Progress Notes (Signed)
Location: [facility] of Service:  Clinic (12)  Provider:   Code Status:  Goals of Care: No flowsheet data found.   Chief Complaint  Patient presents with  . Medical Management of Chronic Issues    Patient returns to the clinic for follow up. He would like to discuss his facial issues, being cut by some glass and digestive issues.     HPI: Patient is a 86 y.o. male seen today for an Routine Visit  Has h/o PAF Continues to have issues with tachybradycardia. Has refused pacemaker. Not on any anticoagulation. Not on any rate slowing medicines follows with cardiology Denies any dizziness No falls  Lower extremity edema due to diastolic HF Now on chronic Lasix. Still has edema in his legs. Refuses to wear TED hoses does not want his Lasix dose to be increased due to frequent urination. No open wounds but unable to wear his shoes. Denies any shortness of breath  Complaining of constipation Takes MiraLAX every day and sometimes has to take mag citrate  Also complaining of a small place on his cheek. Which she was told is precancerous. Follows with Dr. Ronnald Ramp in dermatology. Also had a small plate on his left index finger where he got hurt with glass in his apartment. He wanted to make sure it was not infected.  Patient recently lost his wife who had dementia.  He has 1 daughter who lives in [place] and a his [place].  He has 24 hours caregivers in his apartment.  He walks with a walker and uses wheelchair  Past Medical History:  Diagnosis Date  . Atrial fibrillation ([place])   . CAD (coronary artery disease)   . Diverticulosis   . Hypercholesteremia   . Hypertension   . Hypothyroidism   . Insomnia   . Murmur   . Paroxysmal atrial fibrillation (HCC)   . Urinary dysfunction     Past Surgical History:  Procedure Laterality Date  . BYPASS GRAFT    . HEMORRHOID SURGERY    . HERNIA REPAIR    . HIP SURGERY      Allergies  Allergen Reactions  . Shellfish  Allergy Swelling and Rash    Outpatient Encounter Medications as of 05/16/2020  Medication Sig  . Ascorbic Acid (VITAMIN C) 1000 MG tablet Take 1,000 mg by mouth in the morning and at bedtime.  Marland Kitchen aspirin 81 MG EC tablet in the morning and at bedtime.   . cholecalciferol (VITAMIN D) 1000 UNITS tablet Take 1,000 Units by mouth daily.  . furosemide (LASIX) 20 MG tablet Take 1 tablet (20 mg total) by mouth daily.  Marland Kitchen levothyroxine (SYNTHROID, LEVOTHROID) 25 MCG tablet Take 25 mcg by mouth daily before breakfast.  . Multiple Vitamins-Minerals (CENTRUM SILVER PO) Take by mouth daily.  . Multiple Vitamins-Minerals (PRESERVISION AREDS 2 PO) Take by mouth daily.  Marland Kitchen MYRBETRIQ 50 MG TB24 tablet Take 50 mg by mouth daily.  . Omega-3 1000 MG CAPS Take by mouth in the morning and at bedtime.  . Probiotic Product (PROBIOTIC PO) Take by mouth daily.  Marland Kitchen telmisartan (MICARDIS) 80 MG tablet Take 1 tablet (80 mg total) by mouth daily.  Marland Kitchen zolpidem (AMBIEN) 10 MG tablet Take 5 mg by mouth at bedtime as needed for sleep.   No facility-administered encounter medications on file as of 05/16/2020.    Review of Systems:  Review of Systems  Constitutional: Negative.   HENT: Negative.   Respiratory: Negative.   Cardiovascular: Positive  for leg swelling.  Gastrointestinal: Positive for constipation.  Genitourinary: Positive for frequency.  Musculoskeletal: Positive for gait problem.  Skin: Positive for color change.  Neurological: Positive for weakness.  Psychiatric/Behavioral: Positive for sleep disturbance.    Health Maintenance  Topic Date Due  . PNA vac Low Risk Adult (2 of 2 - PCV13) 08/12/2013  . TETANUS/TDAP  06/14/2014  . INFLUENZA VACCINE  Completed  . COVID-19 Vaccine  Completed    Physical Exam: Vitals:   05/16/20 1141  BP: 124/72  Pulse: (!) 58  Temp: (!) 97 F (36.1 C)  Weight: 167 lb (75.8 kg)  Height: 5\' 7"  (1.702 m)   Body mass index is 26.16 kg/m. Physical Exam Vitals  reviewed.  Constitutional:      Appearance: Normal appearance.  HENT:     Head: Normocephalic.     Nose: Nose normal.     Mouth/Throat:     Mouth: Mucous membranes are moist.     Pharynx: Oropharynx is clear.  Eyes:     Pupils: Pupils are equal, round, and reactive to light.  Cardiovascular:     Rate and Rhythm: Normal rate. Rhythm irregular.     Heart sounds: Murmur heard.   Pulmonary:     Effort: Pulmonary effort is normal.     Breath sounds: Normal breath sounds.  Abdominal:     General: Abdomen is flat. Bowel sounds are normal.     Palpations: Abdomen is soft.     Comments: Has no Focal Deficits.  Musculoskeletal:        General: Swelling present.     Cervical back: Neck supple.  Skin:    General: Skin is warm.  Neurological:     Mental Status: He is alert and oriented to person, place, and time.  Psychiatric:        Mood and Affect: Mood normal.        Thought Content: Thought content normal.     Labs reviewed: Basic Metabolic Panel: Recent Labs    11/07/19 1106 12/05/19 1116 05/05/20 0745  NA 135 136 133*  K 4.6 4.2 4.7  CL 98 99 99  CO2 24 24 26   GLUCOSE 109* 108* 100*  BUN 21 23 29*  CREATININE 1.23 1.30* 1.40*  CALCIUM 9.5 9.5 9.1  TSH  --   --  5.30*   Liver Function Tests: Recent Labs    05/05/20 0745  AST 15  ALT 12  BILITOT 0.6  PROT 6.3   No results for input(s): LIPASE, AMYLASE in the last 8760 hours. No results for input(s): AMMONIA in the last 8760 hours. CBC: Recent Labs    05/05/20 0745  WBC 5.0  NEUTROABS 2,725  HGB 14.4  HCT 41.9  MCV 96.3  PLT 200   Lipid Panel: Recent Labs    05/05/20 0745  CHOL 183  HDL 54  LDLCALC 107*  TRIG 128  CHOLHDL 3.4   No results found for: HGBA1C  Procedures since last visit: No results found.  Assessment/Plan Paroxysmal atrial fibrillation (HCC) Has Tachy brady . Refused PPM Sometimes HR goes down to 40. Stays Asymptomatic No Anticoagulation Follows with  Cardiology  Hypothyroidism, TSH mildily high D/w patient he is taking Synthyroid with food Daughter will change his timing Repeat labs in 4 months  Hypercholesteremia Was taken off Statin  No Need for follow up   Cerebrovascular accident (CVA) due to embolism of cerebral artery (HCC) On Aspirin No Anticoagulation   Coronary artery disease involving native coronary  artery of native heart without angina pectoris Aspirin and Micardis for BP Stage 3b chronic kidney disease (HCC) D/W patient  On Lasix Follow along Hypertension  On Micardis Bilateral leg edema Does not want Ted hoses or Increase dose of Lasix Insomnia,  Ambien to be renewed by [place]  Urinary Frequency Takes Myrbetriq Doing ok History of AS ASymptomatic Constipation Will try Senna Plus with Miralax   Labs/tests ordered:  * No order type specified * Next appt:  Visit date not found

## 2020-05-19 NOTE — Progress Notes (Deleted)
CARDIOLOGY OFFICE NOTE  Date:  05/19/2020    [facility] Barnes Date of Birth: 08-05-1920 Medical Record #[id number]  PCP:  Barnes, Billy X, NP  Cardiologist:  Servando Snare & ***    No chief complaint on file.   History of Present Illness: Billy Barnes is a 86 y.o. male who presents today for a ***  Seen for Dr. Tamala Barnes.   He has a history of CAD with remote CABG, AS, PAF, HTN, presumedcolonic diverticularhemorrhagein2002 requiring transfusion,embolic CVA 4627,OJJ HLD.   He was last seen by a telehealth visit with Dr. Tamala Barnes last August 2020 - was concerned about elevated BP and low HR intermittently. He has been reluctant to have invasive procedures - preferred to not wear a monitor and overall wanted to take a "wait and see approach".  I then saw him last month - some intermittent cough noted but otherwise he thought he was doing ok. He wanted to be on less medicines. Very little walking. Weight was up. He was noted to be in atrial flutter - with probably associated diastolic HF. I stopped his HCTZ, put him on plain ARB and low dose Lasix. Also stopped his Norvasc. He did not wish to be on statin anymore so I stopped that also. Not a candidate for anticoagulation.   The patient does not have symptoms concerning for COVID-19 infection (fever, chills, cough, or new shortness of breath).   Comes in today. Here with his caregiver - Almyra Free again today. He is doing ok. He feels his swelling has improved. He has no chest pain. Says his breathing is ok. Not dizzy. BP is fine. HR is controlled.     Comes in today. Here with   Past Medical History:  Diagnosis Date  . Atrial fibrillation ([place])   . CAD (coronary artery disease)   . Diverticulosis   . Hypercholesteremia   . Hypertension   . Hypothyroidism   . Insomnia   . Murmur   . Paroxysmal atrial fibrillation (HCC)   . Urinary dysfunction     Past Surgical History:  Procedure Laterality Date  . BYPASS GRAFT    .  HEMORRHOID SURGERY    . HERNIA REPAIR    . HIP SURGERY       Medications: No outpatient medications have been marked as taking for the 06/02/20 encounter (Appointment) with Burtis Junes, NP.     Allergies: Allergies  Allergen Reactions  . Shellfish Allergy Swelling and Rash    Social History: The patient  reports that he has quit smoking. He has never used smokeless tobacco. He reports current alcohol use. He reports that he does not use drugs.   Family History: The patient's ***family history includes ALS in his brother; Cancer in his father; Heart disease in his daughter and mother.   Review of Systems: Please see the history of present illness.   All other systems are reviewed and negative.   Physical Exam: VS:  There were no vitals taken for this visit. Billy Barnes  BMI There is no height or weight on file to calculate BMI.  Wt Readings from Last 3 Encounters:  05/16/20 167 lb (75.8 kg)  03/27/20 165 lb 6.4 oz (75 kg)  12/05/19 164 lb 12.8 oz (74.8 kg)    General: Pleasant. Well developed, well nourished and in no acute distress.   HEENT: Normal.  Neck: Supple, no JVD, carotid bruits, or masses noted.  Cardiac: ***Regular rate and rhythm. No murmurs, rubs, or gallops. No  edema.  Respiratory:  Lungs are clear to auscultation bilaterally with normal work of breathing.  GI: Soft and nontender.  MS: No deformity or atrophy. Gait and ROM intact.  Skin: Warm and dry. Color is normal.  Neuro:  Strength and sensation are intact and no gross focal deficits noted.  Psych: Alert, appropriate and with normal affect.   LABORATORY DATA:  EKG:  EKG {ACTION; IS/IS MEQ:[id number]} ordered today.  Personally reviewed by me. This demonstrates ***.  Lab Results  Component Value Date   WBC 5.0 05/05/2020   HGB 14.4 05/05/2020   HCT 41.9 05/05/2020   PLT 200 05/05/2020   GLUCOSE 100 (H) 05/05/2020   CHOL 183 05/05/2020   TRIG 128 05/05/2020   HDL 54 05/05/2020   LDLCALC 107 (H)  05/05/2020   ALT 12 05/05/2020   AST 15 05/05/2020   NA 133 (L) 05/05/2020   K 4.7 05/05/2020   CL 99 05/05/2020   CREATININE 1.40 (H) 05/05/2020   BUN 29 (H) 05/05/2020   CO2 26 05/05/2020   TSH 5.30 (H) 05/05/2020   INR 1.5 03/04/2007     BNP (last 3 results) No results for input(s): BNP in the last 8760 hours.  ProBNP (last 3 results) No results for input(s): PROBNP in the last 8760 hours.   Other Studies Reviewed Today:   Assessment/Plan:  MonitorStudy Highlights7/2019   Basic rhythm is sinus with sinus bradycardia.  Occasional atrial fibrillation with controlled rate.  No symptoms/events recorded.    EchoStudy Conclusions2018  - Left ventricle: The cavity size was normal. There was moderate  focal basal and mild concentric hypertrophy. Systolic function  was normal. The estimated ejection fraction was in the range of  55% to 60%. Wall motion was normal; there were no regional wall  motion abnormalities. There was an increased relative  contribution of atrial contraction to ventricular filling.  Doppler parameters are consistent with abnormal left ventricular  relaxation (grade 1 diastolic dysfunction).  - Aortic valve: There was moderate stenosis. There was mild  regurgitation. Mean gradient (S): 17 mm Hg. Peak gradient (S): 29  mm Hg. Valve area (VTI): 1.16 cm^2. Valve area (Vmax): 1.2 cm^2.  Valve area (Vmean): 1.19 cm^2.  - Mitral valve: Calcified annulus.  - Tricuspid valve: There was mild regurgitation.  - Pulmonic valve: There was trivial regurgitation.     ASSESSMENT & PLAN:   1. Atrial flutter with variable block - overall HR is ok - not on any rate slowing medicines. Poor candidate for anticoagulation. He has not been interested in any further testing or PPM.   2. CAD - managed medically - favor conservative approach - no worrisome symptoms.   3. AS - presume this progressive - he has no cardinal symptoms.    4. HTN - BP looks good with recent med changes.   5. Probable diastolic HF - probably due to being in atrial flutter - swelling looks better - recheck BMET.   6. Prior CVA with chronic speech impairment.   7. HLD - no longer on statin.   8. Probable tachy brady - now in atrial flutter - rate is ok today - see #1.    Current medicines are reviewed with the patient today.  The patient does not have concerns regarding medicines other than what has been noted above.  The following changes have been made:  See above.  Labs/ tests ordered today include:   No orders of the defined types were placed in this encounter.  Disposition:   FU with *** in {gen number [id number]} {Days to years:10300}.   Patient is agreeable to this plan and will call if any problems develop in the interim.   SignedTruitt Merle, NP  05/19/2020 7:35 AM  [facility] [address] [place], [place]  [address] Phone: [phone] Fax: [phone]

## 2020-06-01 ENCOUNTER — Other Ambulatory Visit: Payer: Self-pay | Admitting: Internal Medicine

## 2020-06-02 ENCOUNTER — Ambulatory Visit: Payer: Medicare Other | Admitting: Nurse Practitioner

## 2020-08-02 ENCOUNTER — Other Ambulatory Visit: Payer: Self-pay | Admitting: Internal Medicine

## 2020-08-04 NOTE — Telephone Encounter (Signed)
No treatment agreement on file. Patient has pending appointment in April to sign treatment agreement  Last OV: 05/16/2020  Unable to determine when rx was last filled in [place], provider will need to check [place] database

## 2020-08-12 ENCOUNTER — Encounter: Payer: Self-pay | Admitting: Nurse Practitioner

## 2020-08-12 ENCOUNTER — Non-Acute Institutional Stay: Payer: Medicare Other | Admitting: Nurse Practitioner

## 2020-08-12 ENCOUNTER — Other Ambulatory Visit: Payer: Self-pay

## 2020-08-12 DIAGNOSIS — I480 Paroxysmal atrial fibrillation: Secondary | ICD-10-CM

## 2020-08-12 DIAGNOSIS — I2510 Atherosclerotic heart disease of native coronary artery without angina pectoris: Secondary | ICD-10-CM | POA: Diagnosis not present

## 2020-08-12 DIAGNOSIS — N183 Chronic kidney disease, stage 3 unspecified: Secondary | ICD-10-CM

## 2020-08-12 DIAGNOSIS — I809 Phlebitis and thrombophlebitis of unspecified site: Secondary | ICD-10-CM

## 2020-08-12 DIAGNOSIS — R350 Frequency of micturition: Secondary | ICD-10-CM | POA: Diagnosis not present

## 2020-08-12 DIAGNOSIS — I270 Primary pulmonary hypertension: Secondary | ICD-10-CM

## 2020-08-12 DIAGNOSIS — K5901 Slow transit constipation: Secondary | ICD-10-CM | POA: Diagnosis not present

## 2020-08-12 DIAGNOSIS — I6300 Cerebral infarction due to thrombosis of unspecified precerebral artery: Secondary | ICD-10-CM

## 2020-08-12 DIAGNOSIS — I10 Essential (primary) hypertension: Secondary | ICD-10-CM

## 2020-08-12 DIAGNOSIS — E039 Hypothyroidism, unspecified: Secondary | ICD-10-CM | POA: Diagnosis not present

## 2020-08-12 MED ORDER — MIRABEGRON ER 50 MG PO TB24: 50.0000 mg | ORAL_TABLET | Freq: Every day | ORAL | 1 refills | Status: DC

## 2020-08-12 NOTE — Assessment & Plan Note (Signed)
Bun/creat 29/1. eGFR 42 05/05/20

## 2020-08-12 NOTE — Assessment & Plan Note (Signed)
Constipation, will increase MiraLax to bid from qd if increasing Senokot S to II qhs.

## 2020-08-12 NOTE — Assessment & Plan Note (Signed)
TSH 5.3 05/05/20,  takes Levothyroxine, pending f/u TSH 09/2020

## 2020-08-12 NOTE — Assessment & Plan Note (Signed)
CAD, ASA, Fish oil, Coronary artery bypass grafting 1992

## 2020-08-12 NOTE — Assessment & Plan Note (Signed)
Pulmonary hypertension/edema BLE, takes Furosemide, [place] 11/30/19 negative DVT

## 2020-08-12 NOTE — Assessment & Plan Note (Signed)
no apparent focal weakness. 

## 2020-08-12 NOTE — Assessment & Plan Note (Addendum)
Blood pressure is normalized after BM, better controlled constipation is warranted to better blood pressure control. Continue Telmisartan, Bun/creat 29/1.4, LDL 107 11/22 21

## 2020-08-12 NOTE — Progress Notes (Signed)
Location:   clinic [place] of Service:  Clinic (12) Provider: Marlana Latus NP  Code Status: DNR Goals of Care: No flowsheet data found.   Chief Complaint  Patient presents with  . Acute Visit    Patient returns to the clinic due to blood pressure issues that he associate to his digestive system.     HPI: Patient is a 87 y.o. male seen today for medical management of chronic diseases.      Afib, heart rate is in control. EKG 12/05/19 Afib at cardiology, only takes ASA             Thrombophilia, had work up in the past, venous US negative DVT in legs.              Urinary frequency, takes Mirabegran             CAD, ASA, Fish oil, Coronary artery bypass grafting 1992             Hx of GI bleed, Hgb 14.4 05/05/20             CVA, no apparent focal weakness.              HTN, takes Telmisartan, Bun/creat 29/1.4, LDL 107 11/22 21             Pulmonary hypertension/edema BLE, takes Furosemide, [place] 11/30/19 negative DVT             Insomnia, takes Zolpidem             CKD Bun/creat 29/1. eGFR 42 05/05/20  Constipation, takes MiraLax qd, Senokot S I qhs.  Hypothyroidism, TSH 5.3 05/05/20,  takes Levothyroxine, pending f/u TSH 09/2020 Past Medical History:  Diagnosis Date  . Atrial fibrillation ([place])   . CAD (coronary artery disease)   . Diverticulosis   . Hypercholesteremia   . Hypertension   . Hypothyroidism   . Insomnia   . Murmur   . Paroxysmal atrial fibrillation (HCC)   . Urinary dysfunction     Past Surgical History:  Procedure Laterality Date  . BYPASS GRAFT    . HEMORRHOID SURGERY    . HERNIA REPAIR    . HIP SURGERY      Allergies  Allergen Reactions  . Shellfish Allergy Swelling and Rash    Allergies as of 08/12/2020      Reactions   Shellfish Allergy Swelling, Rash      Medication List       Accurate as of August 12, 2020  3:22 PM. If you have any questions, ask your nurse or doctor.        aspirin 81 MG EC tablet in the morning and at bedtime.    cholecalciferol 1000 units tablet Commonly known as: VITAMIN D Take 1,000 Units by mouth daily.   furosemide 20 MG tablet Commonly known as: Lasix Take 1 tablet (20 mg total) by mouth daily.   levothyroxine 25 MCG tablet Commonly known as: SYNTHROID Take 25 mcg by mouth daily before breakfast.   mirabegron ER 50 MG Tb24 tablet Commonly known as: Myrbetriq Take 1 tablet (50 mg total) by mouth daily. What changed: how much to take Changed by: Man X Mast, NP   Omega-3 1000 MG Caps Take by mouth in the morning and at bedtime.   PRESERVISION AREDS 2 PO Take by mouth daily.   CENTRUM SILVER PO Take by mouth daily.   PROBIOTIC PO Take by mouth daily.   telmisartan  80 MG tablet Commonly known as: Micardis Take 1 tablet (80 mg total) by mouth daily.   vitamin C 1000 MG tablet Take 1,000 mg by mouth in the morning and at bedtime.   zolpidem 5 MG tablet Commonly known as: AMBIEN TAKE 1 TABLET AT BEDTIME AS NEEDED FOR SLEEP. What changed: Another medication with the same name was removed. Continue taking this medication, and follow the directions you see here. Changed by: Man X Mast, NP       Review of Systems:  Review of Systems  Constitutional: Negative for activity change, appetite change and fever.  HENT: Positive for hearing loss. Negative for congestion and voice change.   Respiratory: Positive for shortness of breath. Negative for cough and wheezing.        Occasional nocturnal orthopnea   Cardiovascular: Positive for leg swelling. Negative for chest pain and palpitations.  Gastrointestinal: Positive for constipation. Negative for abdominal pain, nausea and vomiting.  Genitourinary: Positive for frequency. Negative for difficulty urinating and urgency.  Musculoskeletal: Positive for arthralgias and gait problem.  Neurological: Negative for speech difficulty, weakness and headaches.  Psychiatric/Behavioral: Positive for sleep disturbance. Negative for confusion  and hallucinations. The patient is not nervous/anxious.     Health Maintenance  Topic Date Due  . PNA vac Low Risk Adult (2 of 2 - PCV13) 08/12/2013  . TETANUS/TDAP  06/14/2014  . COVID-19 Vaccine (3 - Booster for Moderna series) 01/13/2020  . INFLUENZA VACCINE  Completed  . HPV VACCINES  Aged Out    Physical Exam: Vitals:   08/12/20 1316  BP: 128/70  Temp: (!) 96.2 F (35.7 C)  Weight: 167 lb 8 oz (76 kg)  Height: '5\' 7"'  (1.702 m)   Body mass index is 26.23 kg/m. Physical Exam Vitals and nursing note reviewed.  Constitutional:      Appearance: Normal appearance.  HENT:     Head: Normocephalic and atraumatic.     Nose: Nose normal.     Mouth/Throat:     Mouth: Mucous membranes are moist.  Eyes:     Extraocular Movements: Extraocular movements intact.     Conjunctiva/sclera: Conjunctivae normal.     Pupils: Pupils are equal, round, and reactive to light.  Cardiovascular:     Rate and Rhythm: Bradycardia present. Rhythm irregular.     Heart sounds: Murmur heard.    Pulmonary:     Breath sounds: Rales present. No wheezing or rhonchi.     Comments: Bibasilar rales.  Abdominal:     General: Bowel sounds are normal.     Palpations: Abdomen is soft.     Tenderness: There is no abdominal tenderness. There is no right CVA tenderness, left CVA tenderness, guarding or rebound.  Musculoskeletal:     Cervical back: Normal range of motion and neck supple.     Right lower leg: Edema present.     Left lower leg: Edema present.     Comments: 2+ edema BLE, uses TED sometimes.   Skin:    General: Skin is warm and dry.     Comments: A small SK injury right shoulder blade.   Neurological:     General: No focal deficit present.     Mental Status: He is alert and oriented to person, place, and time. Mental status is at baseline.     Motor: Weakness present.     Coordination: Coordination normal.     Gait: Gait abnormal.     Comments: Left sided weakness form previous CVA, muscle  strength 5/5  Psychiatric:        Mood and Affect: Mood normal.        Behavior: Behavior normal.        Thought Content: Thought content normal.        Judgment: Judgment normal.     Labs reviewed: Basic Metabolic Panel: Recent Labs    11/07/19 1106 12/05/19 1116 05/05/20 0745  NA 135 136 133*  K 4.6 4.2 4.7  CL 98 99 99  CO2 '24 24 26  ' GLUCOSE 109* 108* 100*  BUN 21 23 29*  CREATININE 1.23 1.30* 1.40*  CALCIUM 9.5 9.5 9.1  TSH  --   --  5.30*   Liver Function Tests: Recent Labs    05/05/20 0745  AST 15  ALT 12  BILITOT 0.6  PROT 6.3   No results for input(s): LIPASE, AMYLASE in the last 8760 hours. No results for input(s): AMMONIA in the last 8760 hours. CBC: Recent Labs    05/05/20 0745  WBC 5.0  NEUTROABS 2,725  HGB 14.4  HCT 41.9  MCV 96.3  PLT 200   Lipid Panel: Recent Labs    05/05/20 0745  CHOL 183  HDL 54  LDLCALC 107*  TRIG 128  CHOLHDL 3.4   No results found for: HGBA1C  Procedures since last visit: No results found.  Assessment/Plan  Slow transit constipation Constipation, will increase MiraLax to bid from qd if increasing Senokot S to II qhs.    Hypothyroidism TSH 5.3 05/05/20,  takes Levothyroxine, pending f/u TSH 09/2020  CKD (chronic kidney disease) stage 3, GFR 30-59 ml/min (HCC) Bun/creat 29/1. eGFR 42 05/05/20  Pulmonary hypertension, primary ([place]) Pulmonary hypertension/edema BLE, takes Furosemide, [place] 11/30/19 negative DVT  Hypertension Blood pressure is normalized after BM, better controlled constipation is warranted to better blood pressure control. Continue Telmisartan, Bun/creat 29/1.4, LDL 107 11/22 21   CAD (coronary artery disease) CAD, ASA, Fish oil, Coronary artery bypass grafting 1992  CVA (cerebral vascular accident) ([place]) no apparent focal weakness.   Urinary frequency  takes Mirabegran  Thrombophlebitis  had work up in the past, venous US negative DVT in legs.   Paroxysmal atrial fibrillation   heart rate is in control. EKG 12/05/19 Afib at cardiology, only takes ASA    Labs/tests ordered: none  Next appt:  09/18/2020

## 2020-08-12 NOTE — Assessment & Plan Note (Signed)
had work up in the past, venous US negative DVT in legs.  

## 2020-08-12 NOTE — Assessment & Plan Note (Signed)
takes [place]

## 2020-08-12 NOTE — Assessment & Plan Note (Signed)
heart rate is in control. EKG 12/05/19 Afib at cardiology, only takes ASA 

## 2020-09-14 DIAGNOSIS — Z23 Encounter for immunization: Secondary | ICD-10-CM | POA: Diagnosis not present

## 2020-09-18 ENCOUNTER — Encounter: Payer: Self-pay | Admitting: Nurse Practitioner

## 2020-09-18 ENCOUNTER — Non-Acute Institutional Stay: Payer: Medicare Other | Admitting: Nurse Practitioner

## 2020-09-18 DIAGNOSIS — E039 Hypothyroidism, unspecified: Secondary | ICD-10-CM | POA: Diagnosis not present

## 2020-09-18 DIAGNOSIS — K5901 Slow transit constipation: Secondary | ICD-10-CM

## 2020-09-18 DIAGNOSIS — G4700 Insomnia, unspecified: Secondary | ICD-10-CM | POA: Diagnosis not present

## 2020-09-18 DIAGNOSIS — D6859 Other primary thrombophilia: Secondary | ICD-10-CM | POA: Diagnosis not present

## 2020-09-18 DIAGNOSIS — I270 Primary pulmonary hypertension: Secondary | ICD-10-CM

## 2020-09-18 DIAGNOSIS — E785 Hyperlipidemia, unspecified: Secondary | ICD-10-CM

## 2020-09-18 DIAGNOSIS — Z8719 Personal history of other diseases of the digestive system: Secondary | ICD-10-CM | POA: Diagnosis not present

## 2020-09-18 DIAGNOSIS — I480 Paroxysmal atrial fibrillation: Secondary | ICD-10-CM | POA: Diagnosis not present

## 2020-09-18 DIAGNOSIS — I6300 Cerebral infarction due to thrombosis of unspecified precerebral artery: Secondary | ICD-10-CM

## 2020-09-18 DIAGNOSIS — R350 Frequency of micturition: Secondary | ICD-10-CM

## 2020-09-18 DIAGNOSIS — I10 Essential (primary) hypertension: Secondary | ICD-10-CM | POA: Diagnosis not present

## 2020-09-18 DIAGNOSIS — I2510 Atherosclerotic heart disease of native coronary artery without angina pectoris: Secondary | ICD-10-CM

## 2020-09-18 DIAGNOSIS — N183 Chronic kidney disease, stage 3 unspecified: Secondary | ICD-10-CM | POA: Diagnosis not present

## 2020-09-18 NOTE — Assessment & Plan Note (Signed)
no apparent focal weakness.

## 2020-09-18 NOTE — Assessment & Plan Note (Signed)
Hgb 14.4 05/05/20

## 2020-09-18 NOTE — Assessment & Plan Note (Signed)
Pulmonary hypertension/edema BLE, takes Furosemide, [place] 11/30/19 negative DVT

## 2020-09-18 NOTE — Assessment & Plan Note (Signed)
Afib, heart rate is in control. EKG 12/05/19 Afib at cardiology, only takes ASA

## 2020-09-18 NOTE — Assessment & Plan Note (Signed)
Blood pressure is controlled, continue Telmisartan.

## 2020-09-18 NOTE — Assessment & Plan Note (Signed)
Hypothyroidism, takes Levothyroxine, TSH 5.3 05/05/20, will f/u CBC/diff, TSH

## 2020-09-18 NOTE — Assessment & Plan Note (Signed)
Thrombophilia, had work up in the past, venous US negative DVT in legs.  

## 2020-09-18 NOTE — Assessment & Plan Note (Signed)
Stable, continue MiraLax, Senokot S 

## 2020-09-18 NOTE — Assessment & Plan Note (Signed)
Bun/creat 29/1.4 05/05/20, update CMP/eGFR

## 2020-09-18 NOTE — Progress Notes (Signed)
Location:   clinic [place]   Place of Service:   clinic [place] Provider: Marlana Latus NP  Code Status: DNR Goals of Care: [place] 09/18/2020  Does Patient Have a Medical Advance Directive? Yes  Type of Advance Directive [place]  Does patient want to make changes to medical advance directive? No - Patient declined     Chief Complaint  Patient presents with  . Medical Management of Chronic Issues    4 month follow up/ Update [employer] Contract   . Immunizations    Discuss the need for PNA Vaccine, Tetanus Vaccine, and Covid Booster.    HPI: Patient is a 87 y.o. male seen today for medical management of chronic diseases.    Afib, heart rate is in control. EKG 12/05/19 Afib at cardiology, only takes ASA  Thrombophilia, had work up in the past, venous US negative DVT in legs.        Urinary frequency, takes Mirabegran             CAD, ASA, Fish oil, Coronary artery bypass grafting 1992             Hx of GI bleed, Hgb 14.4 05/05/20             CVA, no apparent focal weakness.              HTN, takes Telmisartan             Pulmonary hypertension/edema BLE, takes Furosemide, [place] 11/30/19 negative DVT             Insomnia, takes Zolpidem  Hypothyroidism, takes Levothyroxine, TSH 5.3 05/05/20             CKD Bun/creat 29/1.4 05/05/20  Hyperlipidemia, LDL 107 05/05/20  Constipation, takes MiraLax, Senokot S Past Medical History:  Diagnosis Date  . Atrial fibrillation ([place])   . CAD (coronary artery disease)   . Diverticulosis   . Hypercholesteremia   . Hypertension   . Hypothyroidism   . Insomnia   . Murmur   . Paroxysmal atrial fibrillation (H[facility])   . Urinary dysfunction     Past Surgical History:  Procedure Laterality Date  . BYPASS GRAFT    . HEMORRHOID SURGERY    . HERNIA REPAIR    . HIP SURGERY      Allergies  Allergen Reactions  . Shellfish Allergy Swelling and Rash    Allergies as of 09/18/2020      Reactions   Shellfish Allergy  Swelling, Rash      Medication List       Accurate as of September 18, 2020 11:59 PM. If you have any questions, ask your nurse or doctor.        aspirin 81 MG EC tablet in the morning and at bedtime.   cholecalciferol 1000 units tablet Commonly known as: VITAMIN D Take 1,000 Units by mouth daily.   furosemide 20 MG tablet Commonly known as: Lasix Take 1 tablet (20 mg total) by mouth daily.   levothyroxine 25 MCG tablet Commonly known as: SYNTHROID Take 25 mcg by mouth daily before breakfast.   mirabegron ER 50 MG Tb24 tablet Commonly known as: Myrbetriq Take 1 tablet (50 mg total) by mouth daily.   Omega-3 1000 MG Caps Take by mouth in the morning and at bedtime.   PRESERVISION AREDS 2 PO Take by mouth daily.   CENTRUM SILVER PO Take by mouth daily.   PROBIOTIC PO Take by mouth daily.  telmisartan 80 MG tablet Commonly known as: Micardis Take 1 tablet (80 mg total) by mouth daily.   vitamin C 1000 MG tablet Take 1,000 mg by mouth in the morning and at bedtime.   zolpidem 5 MG tablet Commonly known as: AMBIEN TAKE 1 TABLET AT BEDTIME AS NEEDED FOR SLEEP.       Review of Systems:  Review of Systems  Constitutional: Negative for activity change, appetite change and fever.  HENT: Positive for hearing loss. Negative for congestion and voice change.   Respiratory: Positive for shortness of breath. Negative for cough and wheezing.        Occasional nocturnal orthopnea   Cardiovascular: Positive for leg swelling. Negative for chest pain and palpitations.  Gastrointestinal: Positive for constipation. Negative for abdominal pain, nausea and vomiting.  Genitourinary: Positive for frequency. Negative for difficulty urinating and urgency.  Musculoskeletal: Positive for arthralgias and gait problem.  Neurological: Negative for speech difficulty, weakness and headaches.  Psychiatric/Behavioral: Positive for sleep disturbance. Negative for confusion and hallucinations.  The patient is not nervous/anxious.     Health Maintenance  Topic Date Due  . PNA vac Low Risk Adult (2 of 2 - PCV13) 08/12/2013  . TETANUS/TDAP  06/14/2014  . COVID-19 Vaccine (3 - Booster for Moderna series) 01/13/2020  . INFLUENZA VACCINE  01/12/2021  . HPV VACCINES  Aged Out    Physical Exam: Vitals:   09/18/20 1538  BP: 90/60  Pulse: 76  Resp: 16  Temp: (!) 96.4 F (35.8 C)  SpO2: 95%  Weight: 168 lb (76.2 kg)  Height: '5\' 7"'  (1.702 m)   Body mass index is 26.31 kg/m. Physical Exam Vitals and nursing note reviewed.  Constitutional:      Appearance: Normal appearance.  HENT:     Head: Normocephalic and atraumatic.     Nose: Nose normal.     Mouth/Throat:     Mouth: Mucous membranes are moist.  Eyes:     Extraocular Movements: Extraocular movements intact.     Conjunctiva/sclera: Conjunctivae normal.     Pupils: Pupils are equal, round, and reactive to light.  Cardiovascular:     Rate and Rhythm: Bradycardia present. Rhythm irregular.     Heart sounds: Murmur heard.    Pulmonary:     Breath sounds: No wheezing, rhonchi or rales.  Abdominal:     General: Bowel sounds are normal.     Palpations: Abdomen is soft.     Tenderness: There is no abdominal tenderness. There is no right CVA tenderness, left CVA tenderness, guarding or rebound.  Musculoskeletal:     Cervical back: Normal range of motion and neck supple.     Right lower leg: Edema present.     Left lower leg: Edema present.     Comments: 2+ edema BLE L>R, uses TED sometimes.   Skin:    General: Skin is warm and dry.     Comments: A small SK injury right shoulder blade.   Neurological:     General: No focal deficit present.     Mental Status: He is alert and oriented to person, place, and time. Mental status is at baseline.     Motor: Weakness present.     Coordination: Coordination normal.     Gait: Gait abnormal.     Comments: Left sided weakness form previous CVA, muscle strength 5/5   Psychiatric:        Mood and Affect: Mood normal.        Behavior: Behavior  normal.        Thought Content: Thought content normal.        Judgment: Judgment normal.     Labs reviewed: Basic Metabolic Panel: Recent Labs    11/07/19 1106 12/05/19 1116 05/05/20 0745  NA 135 136 133*  K 4.6 4.2 4.7  CL 98 99 99  CO2 '24 24 26  ' GLUCOSE 109* 108* 100*  BUN 21 23 29*  CREATININE 1.23 1.30* 1.40*  CALCIUM 9.5 9.5 9.1  TSH  --   --  5.30*   Liver Function Tests: Recent Labs    05/05/20 0745  AST 15  ALT 12  BILITOT 0.6  PROT 6.3   No results for input(s): LIPASE, AMYLASE in the last 8760 hours. No results for input(s): AMMONIA in the last 8760 hours. CBC: Recent Labs    05/05/20 0745  WBC 5.0  NEUTROABS 2,725  HGB 14.4  HCT 41.9  MCV 96.3  PLT 200   Lipid Panel: Recent Labs    05/05/20 0745  CHOL 183  HDL 54  LDLCALC 107*  TRIG 128  CHOLHDL 3.4   No results found for: HGBA1C  Procedures since last visit: No results found.  Assessment/Plan  Paroxysmal atrial fibrillation Afib, heart rate is in control. EKG 12/05/19 Afib at cardiology, only takes ASA  Thrombophilia ([place]) Thrombophilia, had work up in the past, venous US negative DVT in legs.  Urinary frequency Remains no change, continue Mirbegran  CAD (coronary artery disease) CAD, ASA, Fish oil, Coronary artery bypass grafting 1992   History of GI bleed Hgb 14.4 05/05/20  CVA (cerebral vascular accident) ([place]) no apparent focal weakness.    Hypertension Blood pressure is controlled, continue Telmisartan.   Pulmonary hypertension, primary ([place]) Pulmonary hypertension/edema BLE, takes Furosemide, [place] 11/30/19 negative DVT   Insomnia Takes Zolpizem  Hypothyroidism Hypothyroidism, takes Levothyroxine, TSH 5.3 05/05/20, will f/u CBC/diff, TSH  CKD (chronic kidney disease) stage 3, GFR 30-59 ml/min (HCC) Bun/creat 29/1.4 05/05/20, update CMP/eGFR  Hyperlipidemia LDL 107  05/05/20  Slow transit constipation Stable, continue MiraLax, Senokot S   Labs/tests ordered:  CBC//diff, CMP/eGFR, TSH prior to the next appointment.  Next appt:  4 months

## 2020-09-18 NOTE — Assessment & Plan Note (Signed)
Remains no change, continue Mirbegran

## 2020-09-18 NOTE — Assessment & Plan Note (Signed)
LDL 107 05/05/20

## 2020-09-18 NOTE — Assessment & Plan Note (Signed)
Takes [employer]

## 2020-09-18 NOTE — Assessment & Plan Note (Signed)
CAD, ASA, Fish oil, Coronary artery bypass grafting 1992

## 2020-09-19 ENCOUNTER — Encounter: Payer: Self-pay | Admitting: Nurse Practitioner

## 2020-10-04 ENCOUNTER — Other Ambulatory Visit: Payer: Self-pay | Admitting: Nurse Practitioner

## 2020-10-10 ENCOUNTER — Telehealth: Payer: Self-pay | Admitting: *Deleted

## 2020-10-10 NOTE — Telephone Encounter (Signed)
Received fax from [place] requesting prior Authorization for patient's Myrbetriq ER 50mg .  Filled out form and placed in ManXie's box for review and signature.  Patient ID: [id number] To be faxed back to [phone] once completed.

## 2020-10-23 ENCOUNTER — Other Ambulatory Visit: Payer: Self-pay

## 2020-10-23 MED ORDER — TELMISARTAN 80 MG PO TABS: 80.0000 mg | ORAL_TABLET | Freq: Every day | ORAL | 0 refills | Status: DC

## 2020-10-23 MED ORDER — FUROSEMIDE 20 MG PO TABS: 20.0000 mg | ORAL_TABLET | Freq: Every day | ORAL | 0 refills | Status: DC

## 2020-11-22 ENCOUNTER — Encounter (HOSPITAL_BASED_OUTPATIENT_CLINIC_OR_DEPARTMENT_OTHER): Payer: Self-pay | Admitting: Emergency Medicine

## 2020-11-22 ENCOUNTER — Emergency Department (HOSPITAL_BASED_OUTPATIENT_CLINIC_OR_DEPARTMENT_OTHER)
Admission: EM | Admit: 2020-11-22 | Discharge: 2020-11-22 | Disposition: A | Discharge status: Home / Self Care | Payer: Medicare Other | Attending: Emergency Medicine | Admitting: Emergency Medicine

## 2020-11-22 DIAGNOSIS — R39198 Other difficulties with micturition: Secondary | ICD-10-CM | POA: Diagnosis not present

## 2020-11-22 DIAGNOSIS — E039 Hypothyroidism, unspecified: Secondary | ICD-10-CM | POA: Insufficient documentation

## 2020-11-22 DIAGNOSIS — R339 Retention of urine, unspecified: Secondary | ICD-10-CM | POA: Diagnosis not present

## 2020-11-22 DIAGNOSIS — I2510 Atherosclerotic heart disease of native coronary artery without angina pectoris: Secondary | ICD-10-CM | POA: Diagnosis not present

## 2020-11-22 DIAGNOSIS — Z79899 Other long term (current) drug therapy: Secondary | ICD-10-CM | POA: Diagnosis not present

## 2020-11-22 DIAGNOSIS — Z7982 Long term (current) use of aspirin: Secondary | ICD-10-CM | POA: Insufficient documentation

## 2020-11-22 DIAGNOSIS — N183 Chronic kidney disease, stage 3 unspecified: Secondary | ICD-10-CM | POA: Insufficient documentation

## 2020-11-22 DIAGNOSIS — I129 Hypertensive chronic kidney disease with stage 1 through stage 4 chronic kidney disease, or unspecified chronic kidney disease: Secondary | ICD-10-CM | POA: Insufficient documentation

## 2020-11-22 DIAGNOSIS — Z87891 Personal history of nicotine dependence: Secondary | ICD-10-CM | POA: Insufficient documentation

## 2020-11-22 LAB — COMPREHENSIVE METABOLIC PANEL
ALT: 9 U/L (ref 0–44)
AST: 18 U/L (ref 15–41)
Albumin: 3.3 g/dL — ABNORMAL LOW (ref 3.5–5.0)
Alkaline Phosphatase: 55 U/L (ref 38–126)
Anion gap: 9 (ref 5–15)
BUN: 20 mg/dL (ref 8–23)
CO2: 22 mmol/L (ref 22–32)
Calcium: 8.5 mg/dL — ABNORMAL LOW (ref 8.9–10.3)
Chloride: 99 mmol/L (ref 98–111)
Creatinine, Ser: 1.25 mg/dL — ABNORMAL HIGH (ref 0.61–1.24)
GFR, Estimated: 52 mL/min — ABNORMAL LOW (ref >60)
Glucose, Bld: 89 mg/dL (ref 70–99)
Potassium: 4 mmol/L (ref 3.5–5.1)
Sodium: 130 mmol/L — ABNORMAL LOW (ref 135–145)
Total Bilirubin: 0.6 mg/dL (ref 0.3–1.2)
Total Protein: 6.4 g/dL — ABNORMAL LOW (ref 6.5–8.1)

## 2020-11-22 NOTE — ED Notes (Signed)
Pt voided and had BM . Pt bladder scanned for 39 cc. Pt and daughter now saying its ok if MD  Doesn't want labs. Will ask MD.

## 2020-11-22 NOTE — ED Notes (Signed)
Attempted iv stick unsuccessfully to left AC.

## 2020-11-22 NOTE — ED Notes (Signed)
Pt bladder scan yielded 137, pt does not have any tenderness or urge to void. Pt and his daughter states he is well hydrated.

## 2020-11-22 NOTE — Discharge Instructions (Addendum)
Symptoms persist would recommend following up with urology.  Referral information provided above if you need it you will have to call and make a phone call.  Today's work-up without any significant findings however we were not able to rule out a urinary tract infection.  Return for any new or worse symptoms.

## 2020-11-22 NOTE — ED Notes (Signed)
Pt dc home with daughter via wheelchair. Pt's daughter and patient verbalized understanding of dc instructions.

## 2020-11-22 NOTE — ED Triage Notes (Signed)
Pt reports not being able to void since 1000 last n ight. Pt denies ever having this problem before. Pt denies n/v/d/fever. No recent change in meds and no recent illnesses.

## 2020-11-22 NOTE — ED Notes (Signed)
Unable to obtain cbc, unsuccessful attempt 3. Pt's extremities cold, applied heat pack before attempt.

## 2020-11-22 NOTE — ED Provider Notes (Signed)
[facility] EMERGENCY DEPT Provider Note   CSN: [id number] Arrival date & time: 11/22/20  1043     History Chief Complaint  Patient presents with   Urinary Retention    Billy MAZZUCA is a 87 y.o. male.  Patient accompanied by his daughter.  Patient says not been able to void since 2200 last night.  Patient denies ever having this problem before.  No nausea vomiting diarrhea or fever.  No abdominal pain.  Does not feel as if his bladder is distended.  Does not even need to feel like he has to pee.  Daughter states he has been hydrating himself well.  No history of any prostate problems in the past.  No fever.      Past Medical History:  Diagnosis Date   Atrial fibrillation (HCC)    CAD (coronary artery disease)    Diverticulosis    Hypercholesteremia    Hypertension    Hypothyroidism    Insomnia    Murmur    Paroxysmal atrial fibrillation [facility])    Urinary dysfunction     Patient Active Problem List   Diagnosis Date Noted   Slow transit constipation 08/12/2020   Thrombophilia ([place]) 03/27/2020   Urinary frequency 03/27/2020   History of GI bleed 03/27/2020   Pulmonary hypertension, primary ([place]) 03/27/2020   Insomnia 03/27/2020   CKD (chronic kidney disease) stage 3, GFR 30-59 ml/min (HCC) 03/27/2020   Bradycardia 03/27/2020   CVA (cerebral vascular accident) ([place]) 02/27/2018   Atrial flutter ([place]) 02/26/2014   Aortic stenosis 02/26/2014   Hypertension    Hyperlipidemia    CAD (coronary artery disease)    Diverticulosis    Hypothyroidism    Paroxysmal atrial fibrillation (HCC)     Past Surgical History:  Procedure Laterality Date   BYPASS GRAFT     HEMORRHOID SURGERY     HERNIA REPAIR     HIP SURGERY         Family History  Problem Relation Age of Onset   Heart disease Mother    Cancer Father    ALS Brother    Heart disease Daughter     Social History   Tobacco Use   Smoking status: Former    Pack years: 0.00   Smokeless  tobacco: Never  Substance Use Topics   Alcohol use: Yes    Comment: occasional   Drug use: No    Home Medications Prior to Admission medications   Medication Sig Start Date End Date Taking? Authorizing Provider  Ascorbic Acid (VITAMIN C) 1000 MG tablet Take 1,000 mg by mouth in the morning and at bedtime.   Yes [provider]  aspirin 81 MG EC tablet in the morning and at bedtime.    Yes [provider]  cholecalciferol (VITAMIN D) 1000 UNITS tablet Take 1,000 Units by mouth daily.   Yes [provider]  furosemide (LASIX) 20 MG tablet Take 1 tablet (20 mg total) by mouth daily. Please make overdue appt with Dr. Tamala Julian for June 2022 for future refills. Thank you 1st attempt 10/23/20  Yes Belva Crome, MD  levothyroxine (SYNTHROID, LEVOTHROID) 25 MCG tablet Take 25 mcg by mouth daily before breakfast.   Yes [provider]  Multiple Vitamins-Minerals (CENTRUM SILVER PO) Take by mouth daily.   Yes [provider]  Multiple Vitamins-Minerals (PRESERVISION AREDS 2 PO) Take by mouth daily.   Yes [provider]  Omega-3 1000 MG CAPS Take by mouth in the morning and at  bedtime.   Yes [provider]  Probiotic Product (PROBIOTIC PO) Take by mouth daily.   Yes [provider]  telmisartan (MICARDIS) 80 MG tablet Take 1 tablet (80 mg total) by mouth daily. Please make yearly appt with Dr. Tamala Julian for June 2022 for future refills. Thank you 1st attempt 10/23/20  Yes Belva Crome, MD  zolpidem (AMBIEN) 5 MG tablet TAKE 1 TABLET AT BEDTIME AS NEEDED FOR SLEEP. 10/06/20  Yes Mast, Man X, NP  mirabegron ER (MYRBETRIQ) 50 MG TB24 tablet Take 1 tablet (50 mg total) by mouth daily. 08/12/20   Mast, Man X, NP    Allergies    Shellfish allergy  Review of Systems   Review of Systems  Constitutional:  Negative for chills and fever.  HENT:  Negative for rhinorrhea and sore throat.   Eyes:  Negative for visual disturbance.  Respiratory:   Negative for cough and shortness of breath.   Cardiovascular:  Negative for chest pain and leg swelling.  Gastrointestinal:  Negative for abdominal pain, diarrhea, nausea and vomiting.  Genitourinary:  Positive for difficulty urinating. Negative for dysuria and hematuria.  Musculoskeletal:  Negative for back pain and neck pain.  Skin:  Negative for rash.  Neurological:  Negative for dizziness, light-headedness and headaches.  Hematological:  Does not bruise/bleed easily.  Psychiatric/Behavioral:  Negative for confusion.    Physical Exam Updated Vital Signs BP (!) 142/59 (BP Location: Right Arm)   Pulse (!) 47   Temp 98 F (36.7 C) (Oral)   Resp 16   Ht 1.702 m (5\' 7" )   Wt 76.5 kg   SpO2 98%   BMI 26.41 kg/m   Physical Exam Vitals and nursing note reviewed.  Constitutional:      Appearance: Normal appearance. He is well-developed.  HENT:     Head: Normocephalic and atraumatic.  Eyes:     Extraocular Movements: Extraocular movements intact.     Conjunctiva/sclera: Conjunctivae normal.     Pupils: Pupils are equal, round, and reactive to light.  Cardiovascular:     Rate and Rhythm: Normal rate and regular rhythm.     Heart sounds: No murmur heard. Pulmonary:     Effort: Pulmonary effort is normal. No respiratory distress.     Breath sounds: Normal breath sounds.  Abdominal:     General: There is no distension.     Palpations: Abdomen is soft. There is no mass.     Tenderness: There is no abdominal tenderness.  Musculoskeletal:        General: No swelling. Normal range of motion.     Cervical back: Normal range of motion and neck supple.  Skin:    General: Skin is warm and dry.  Neurological:     General: No focal deficit present.     Mental Status: He is alert. Mental status is at baseline.    ED Results / Procedures / Treatments   Labs (all labs ordered are listed, but only abnormal results are displayed) Labs Reviewed  COMPREHENSIVE METABOLIC PANEL - Abnormal;  Notable for the following components:      Result Value   Sodium 130 (*)    Creatinine, Ser 1.25 (*)    Calcium 8.5 (*)    Total Protein 6.4 (*)    Albumin 3.3 (*)    GFR, Estimated 52 (*)    All other components within normal limits  CBC WITH DIFFERENTIAL/PLATELET  CBC WITH DIFFERENTIAL/PLATELET    EKG None  Radiology No results  found.  Procedures Procedures   Medications Ordered in ED Medications - No data to display  ED Course  I have reviewed the triage vital signs and the nursing notes.  Pertinent labs & imaging results that were available during my care of the patient were reviewed by me and considered in my medical decision making (see chart for details).    MDM Rules/Calculators/A&P                          Bladder scan showed just about 130 cc of urine in the bladder.  No palpable mass in the abdomen.  Patient in no discomfort.  We ordered labs to make sure your kidney function was fine.  His BUN and creatinine was 20 and 1.25.  GFR 52 not significant change from his baseline.  Patient then went on to void several times while here.  We are not able to capture her urine however.  Patient now feels fine.  Renal function being normal.  Daughter feels comfortable.  We will give him referral to urology if symptoms recur.  But stable for discharge home.  Clinically no symptoms consistent with urinary tract infection. Final Clinical Impression(s) / ED Diagnoses Final diagnoses:  Difficulty urinating    Rx / DC Orders ED Discharge Orders     None        Fredia Sorrow, MD 11/22/20 1625

## 2020-12-06 ENCOUNTER — Other Ambulatory Visit: Payer: Self-pay | Admitting: Nurse Practitioner

## 2020-12-08 NOTE — Telephone Encounter (Signed)
Patient has request refill on "Ambien 5mg ". Patient last refill was 10/06/2020 with 30 tablets and 1 refill. Patient is due for refill. Medication pend and sent to PCP Mast, Man X, NP for approval. Please Advise.

## 2020-12-12 ENCOUNTER — Telehealth: Payer: Self-pay

## 2020-12-12 ENCOUNTER — Telehealth: Payer: Self-pay | Admitting: Interventional Cardiology

## 2020-12-12 DIAGNOSIS — I5032 Chronic diastolic (congestive) heart failure: Secondary | ICD-10-CM

## 2020-12-12 DIAGNOSIS — M7989 Other specified soft tissue disorders: Secondary | ICD-10-CM

## 2020-12-12 DIAGNOSIS — I480 Paroxysmal atrial fibrillation: Secondary | ICD-10-CM

## 2020-12-12 NOTE — Telephone Encounter (Signed)
Spoke with daughter.  Pt currently taking Furosemide 20mg  QD. Noted to have swelling in bilateral ankles and feet.  No weight gain (didn't have numbers).  Didn't have BP readings but home health nurse told her it was normal.  Denies SOB.  Home Health nurse asked if pt needed to increase Furosemide.  Advised I will send to Dr. Tamala Julian for review.

## 2020-12-12 NOTE — Telephone Encounter (Signed)
Spoke with Dr. Tamala Julian and he said to have pt come in for Pro BNP, BMET, Liver and TSH and an EKG so we can make sure he is not in AF.  Spoke with daughter and made her aware of recommendations.  She will bring pt on Tuesday at 2pm for EKG and labs.  Daughter appreciative for call.

## 2020-12-12 NOTE — Telephone Encounter (Signed)
Daughter Vaughan Basta called stating that patient is having a little more leg swelling than usual, she stated that Nurse from [facility] evaluated him and would like to know if maybe he should increase his lasix. He is on Lasix 20 mg. Dr. Daneen Schick prescribes Lasix.  Patient was informed that she should call and speak to Dr. Daneen Schick about Lasix since he prescribed medication and the cardiologist.

## 2020-12-12 NOTE — Telephone Encounter (Signed)
Pt c/o swelling: STAT is pt has developed SOB within 24 hours  How much weight have you gained and in what time span? None  If swelling, where is the swelling located? Bilateral legs/ankles  Are you currently taking a fluid pill? 20 Mg Lasix  Are you currently SOB?  No  Do you have a log of your daily weights (if so, list)? No weight gain  Have you gained 3 pounds in a day or 5 pounds in a week? no  Have you traveled recently? No Pt's daughter Vaughan Basta stated the [facility] Nurse would like to know if the pt's Lasix 20mg  dosage be increased 40mg 

## 2020-12-16 ENCOUNTER — Other Ambulatory Visit: Payer: Medicare Other

## 2020-12-24 ENCOUNTER — Other Ambulatory Visit: Payer: Self-pay | Admitting: *Deleted

## 2020-12-24 ENCOUNTER — Encounter: Payer: Self-pay | Admitting: Nurse Practitioner

## 2020-12-24 ENCOUNTER — Non-Acute Institutional Stay (SKILLED_NURSING_FACILITY): Payer: Medicare Other | Admitting: Nurse Practitioner

## 2020-12-24 DIAGNOSIS — E785 Hyperlipidemia, unspecified: Secondary | ICD-10-CM

## 2020-12-24 DIAGNOSIS — G4700 Insomnia, unspecified: Secondary | ICD-10-CM | POA: Diagnosis not present

## 2020-12-24 DIAGNOSIS — I10 Essential (primary) hypertension: Secondary | ICD-10-CM | POA: Diagnosis not present

## 2020-12-24 DIAGNOSIS — D6859 Other primary thrombophilia: Secondary | ICD-10-CM

## 2020-12-24 DIAGNOSIS — N183 Chronic kidney disease, stage 3 unspecified: Secondary | ICD-10-CM

## 2020-12-24 DIAGNOSIS — I270 Primary pulmonary hypertension: Secondary | ICD-10-CM | POA: Diagnosis not present

## 2020-12-24 DIAGNOSIS — R350 Frequency of micturition: Secondary | ICD-10-CM | POA: Diagnosis not present

## 2020-12-24 DIAGNOSIS — Z8719 Personal history of other diseases of the digestive system: Secondary | ICD-10-CM

## 2020-12-24 DIAGNOSIS — I480 Paroxysmal atrial fibrillation: Secondary | ICD-10-CM

## 2020-12-24 DIAGNOSIS — E039 Hypothyroidism, unspecified: Secondary | ICD-10-CM | POA: Diagnosis not present

## 2020-12-24 DIAGNOSIS — I6300 Cerebral infarction due to thrombosis of unspecified precerebral artery: Secondary | ICD-10-CM

## 2020-12-24 MED ORDER — ZOLPIDEM TARTRATE 5 MG PO TABS: ORAL_TABLET | ORAL | 0 refills | Status: DC

## 2020-12-24 NOTE — Progress Notes (Signed)
Location:   [facility] Room Number: [id number] Place of Service:  SNF (31) Provider:  Dantavious Snowball X Emmelyn Schmale  Jonathyn Carothers X, NP  Patient Care Team: Dyer Klug X, NP as PCP - General (Internal Medicine) Belva Crome, MD as PCP - Cardiology (Cardiology)  Extended Emergency Contact Information Primary Emergency Contact: Carson Myrtle Address: [address]          [place], [place] Phone: [phone] Relation: Spouse Secondary Emergency Contact: Alan Ripper Address: [place], [place] [address] [place] Phone: [phone] Relation: Daughter  Code Status:  DNR Goals of care: Advanced Directive information Advanced Directives 12/24/2020  Does Patient Have a Medical Advance Directive? Yes  Type of Paramedic of [place];Living will  Does patient want to make changes to medical advance directive? No - Patient declined  Copy of [place] in Chart? Yes - validated most recent copy scanned in chart (See row information)  Would patient like information on creating a medical advance directive? -     Chief Complaint  Patient presents with   Acute Visit    Patient presents for a review of medications.     HPI:  Pt is a 87 y.o. male seen today for an acute visit for  Medication review  Afib, heart rate is in control. EKG 12/05/19 Afib at cardiology, only takes ASA             Thrombophilia, had work up in the past, venous US negative DVT in legs.    Urinary frequency, no difference since off Mirabegran due to cost, had urinary retention, s/p ED eval 11/22/20, f/u urology             CAD, ASA, Fish oil, Coronary artery bypass grafting 1992             Hx of GI bleed, Hgb 14.4 05/05/20             CVA, no apparent focal weakness.             HTN, takes Telmisartan             Pulmonary hypertension/edema BLE, takes Furosemide, [place] 11/30/19 negative DVT              Insomnia, takes Zolpidem             Hypothyroidism, takes Levothyroxine, TSH 5.3 05/05/20             CKD Bun/creat 20/1.25 eGFR 52 11/22/20             Hyperlipidemia, LDL 107 05/05/20             Constipation, takes MiraLax, Senokot S  Past Medical History:  Diagnosis Date   Atrial fibrillation (HCC)    CAD (coronary artery disease)    Diverticulosis    Hypercholesteremia    Hypertension    Hypothyroidism    Insomnia    Murmur    Paroxysmal atrial fibrillation (HCC)    Urinary dysfunction    Past Surgical History:  Procedure Laterality Date   BYPASS GRAFT     HEMORRHOID SURGERY     HERNIA REPAIR     HIP SURGERY      Allergies  Allergen Reactions   Shellfish Allergy Swelling and Rash    Allergies as of 12/24/2020       Reactions   Shellfish  Allergy Swelling, Rash        Medication List        Accurate as of December 24, 2020 11:59 PM. If you have any questions, ask your nurse or doctor.          aspirin 81 MG EC tablet in the morning and at bedtime.   cholecalciferol 1000 units tablet Commonly known as: VITAMIN D Take 1,000 Units by mouth daily.   furosemide 20 MG tablet Commonly known as: Lasix Take 1 tablet (20 mg total) by mouth daily. Please make overdue appt with Dr. Tamala Julian for June 2022 for future refills. Thank you 1st attempt   levothyroxine 25 MCG tablet Commonly known as: SYNTHROID Take 25 mcg by mouth daily before breakfast.   mirabegron ER 50 MG Tb24 tablet Commonly known as: Myrbetriq Take 1 tablet (50 mg total) by mouth daily.   Omega-3 1000 MG Caps Take by mouth in the morning and at bedtime.   PRESERVISION AREDS 2 PO Take by mouth daily.   CENTRUM SILVER PO Take by mouth daily.   PROBIOTIC PO Take by mouth daily.   telmisartan 80 MG tablet Commonly known as: Micardis Take 1 tablet (80 mg total) by mouth daily. Please make yearly appt with Dr. Tamala Julian for June 2022 for future refills. Thank you 1st attempt   vitamin C 1000  MG tablet Take 1,000 mg by mouth in the morning and at bedtime.   zolpidem 5 MG tablet Commonly known as: AMBIEN TAKE ONE TABLET AT BEDTIME AS NEEDED FOR SLEEP.        Review of Systems  Constitutional:  Negative for activity change, appetite change and fever.  HENT:  Positive for hearing loss. Negative for congestion and voice change.   Respiratory:  Positive for shortness of breath. Negative for cough and wheezing.        Occasional nocturnal orthopnea   Cardiovascular:  Positive for leg swelling. Negative for chest pain and palpitations.  Gastrointestinal:  Positive for constipation. Negative for abdominal pain, nausea and vomiting.  Genitourinary:  Positive for frequency. Negative for difficulty urinating and urgency.  Musculoskeletal:  Positive for arthralgias and gait problem.  Neurological:  Negative for speech difficulty, weakness and headaches.  Psychiatric/Behavioral:  Positive for sleep disturbance. Negative for confusion and hallucinations. The patient is not nervous/anxious.    Immunization History  Administered Date(s) Administered   Influenza, High Dose Seasonal PF 04/11/2017, 03/26/2020   Influenza-Unspecified 03/26/2020   Moderna Sars-Covid-2 Vaccination 06/18/2019, 07/16/2019   Pneumococcal-Unspecified 08/12/2012   Tetanus 06/14/2004   Zoster Recombinat (Shingrix) 06/02/2017   Pertinent  Health Maintenance Due  Topic Date Due   PNA vac Low Risk Adult (2 of 2 - PCV13) 08/12/2013   INFLUENZA VACCINE  01/12/2021   Fall Risk  09/18/2020 08/12/2020 05/16/2020  Falls in the past year? 0 0 0  Number falls in past yr: 0 0 0  Injury with Fall? 0 - -   Functional Status Survey:    Vitals:   12/24/20 1424  BP: 127/64  Pulse: 60  Resp: 18  SpO2: 95%   There is no height or weight on file to calculate BMI. Physical Exam Vitals and nursing note reviewed.  Constitutional:      Appearance: Normal appearance.  HENT:     Head: Normocephalic and atraumatic.      Nose: Nose normal.     Mouth/Throat:     Mouth: Mucous membranes are moist.  Eyes:     Extraocular Movements: Extraocular  movements intact.     Conjunctiva/sclera: Conjunctivae normal.     Pupils: Pupils are equal, round, and reactive to light.  Cardiovascular:     Rate and Rhythm: Bradycardia present. Rhythm irregular.     Heart sounds: Murmur heard.  Pulmonary:     Breath sounds: No wheezing, rhonchi or rales.  Abdominal:     General: Bowel sounds are normal.     Palpations: Abdomen is soft.     Tenderness: There is no abdominal tenderness. There is no right CVA tenderness, left CVA tenderness, guarding or rebound.  Musculoskeletal:     Cervical back: Normal range of motion and neck supple.     Right lower leg: Edema present.     Left lower leg: Edema present.     Comments: 2+ edema BLE L>R, uses TED sometimes. Dependent edema in hands-denied pain, tingling or numbness, radial pulses present  Skin:    General: Skin is warm and dry.     Comments: A small SK injury right shoulder blade.   Neurological:     General: No focal deficit present.     Mental Status: He is alert and oriented to person, place, and time. Mental status is at baseline.     Motor: Weakness present.     Coordination: Coordination normal.     Gait: Gait abnormal.     Comments: Left sided weakness form previous CVA, muscle strength 5/5  Psychiatric:        Mood and Affect: Mood normal.        Behavior: Behavior normal.        Thought Content: Thought content normal.        Judgment: Judgment normal.    Labs reviewed: Recent Labs    05/05/20 0745 11/22/20 1129  NA 133* 130*  K 4.7 4.0  CL 99 99  CO2 26 22  GLUCOSE 100* 89  BUN 29* 20  CREATININE 1.40* 1.25*  CALCIUM 9.1 8.5*   Recent Labs    05/05/20 0745 11/22/20 1129  AST 15 18  ALT 12 9  ALKPHOS  --  55  BILITOT 0.6 0.6  PROT 6.3 6.4*  ALBUMIN  --  3.3*   Recent Labs    05/05/20 0745  WBC 5.0  NEUTROABS 2,725  HGB 14.4  HCT 41.9   MCV 96.3  PLT 200   Lab Results  Component Value Date   TSH 5.30 (H) 05/05/2020   No results found for: HGBA1C Lab Results  Component Value Date   CHOL 183 05/05/2020   HDL 54 05/05/2020   LDLCALC 107 (H) 05/05/2020   TRIG 128 05/05/2020   CHOLHDL 3.4 05/05/2020    Significant Diagnostic Results in last 30 days:  No results found.  Assessment/Plan Hypothyroidism takes Levothyroxine, TSH 5.3 05/05/20, update TSH  Urinary frequency Urinary frequency, continue to be off Mirabegran, had urinary retention, s/p ED eval 11/22/20, f/u urology  Insomnia Continue Zolpidem.   Hyperlipidemia LDL 107 05/05/20  CKD (chronic kidney disease) stage 3, GFR 30-59 ml/min (HCC) Bun/creat 20/1.25 eGFR 52 11/22/20, update CMP/eGFR  Pulmonary hypertension, primary ([place]) Pulmonary hypertension/edema BLE, takes Furosemide, [place] 11/30/19 negative DVT  Hypertension Blood pressure is controlled, continue Telmisartan  CVA (cerebral vascular accident) ([place]) No residual focal weakness.   History of GI bleed Hx of GI bleed, Hgb 14.4 05/05/20, update CBC/diff.   Thrombophilia ([place]) had work up in the past, venous US negative DVT in legs.   Paroxysmal atrial fibrillation Afib, heart rate is in  control. EKG 12/05/19 Afib at cardiology, only takes ASA    Family/ staff Communication: plan of care reviewed with the patient and charge nurse.   Labs/tests ordered:  none  Time spend 35 minutes.

## 2020-12-24 NOTE — Assessment & Plan Note (Addendum)
Bun/creat 20/1.25 eGFR 52 11/22/20, update CMP/eGFR

## 2020-12-24 NOTE — Progress Notes (Signed)
This encounter was created in error - please disregard.

## 2020-12-24 NOTE — Assessment & Plan Note (Signed)
Hx of GI bleed, Hgb 14.4 05/05/20, update CBC/diff.

## 2020-12-24 NOTE — Assessment & Plan Note (Signed)
Blood pressure is controlled, continue Telmisartan

## 2020-12-24 NOTE — Telephone Encounter (Signed)
Received refill Request from [facility] and sent to [facility] for approval.

## 2020-12-24 NOTE — Assessment & Plan Note (Signed)
No residual focal weakness.

## 2020-12-24 NOTE — Assessment & Plan Note (Signed)
Pulmonary hypertension/edema BLE, takes Furosemide, [place] 11/30/19 negative DVT

## 2020-12-24 NOTE — Assessment & Plan Note (Signed)
had work up in the past, venous US negative DVT in legs.  

## 2020-12-24 NOTE — Assessment & Plan Note (Signed)
LDL 107 05/05/20

## 2020-12-24 NOTE — Assessment & Plan Note (Signed)
Afib, heart rate is in control. EKG 12/05/19 Afib at cardiology, only takes ASA

## 2020-12-24 NOTE — Assessment & Plan Note (Signed)
takes Levothyroxine, TSH 5.3 05/05/20, update TSH

## 2020-12-24 NOTE — Assessment & Plan Note (Signed)
Continue Zolpidem.

## 2020-12-24 NOTE — Assessment & Plan Note (Addendum)
Urinary frequency, continue to be off Mirabegran, had urinary retention, s/p ED eval 11/22/20, f/u urology

## 2020-12-25 ENCOUNTER — Encounter: Payer: Self-pay | Admitting: Nurse Practitioner

## 2020-12-25 DIAGNOSIS — I639 Cerebral infarction, unspecified: Secondary | ICD-10-CM | POA: Diagnosis not present

## 2020-12-25 DIAGNOSIS — I10 Essential (primary) hypertension: Secondary | ICD-10-CM | POA: Diagnosis not present

## 2020-12-25 DIAGNOSIS — R41841 Cognitive communication deficit: Secondary | ICD-10-CM | POA: Diagnosis not present

## 2020-12-25 DIAGNOSIS — R1312 Dysphagia, oropharyngeal phase: Secondary | ICD-10-CM | POA: Diagnosis not present

## 2020-12-25 DIAGNOSIS — N183 Chronic kidney disease, stage 3 unspecified: Secondary | ICD-10-CM | POA: Diagnosis not present

## 2020-12-25 DIAGNOSIS — R1310 Dysphagia, unspecified: Secondary | ICD-10-CM | POA: Diagnosis not present

## 2020-12-25 DIAGNOSIS — E039 Hypothyroidism, unspecified: Secondary | ICD-10-CM | POA: Diagnosis not present

## 2020-12-25 DIAGNOSIS — K922 Gastrointestinal hemorrhage, unspecified: Secondary | ICD-10-CM | POA: Diagnosis not present

## 2020-12-26 LAB — HEPATIC FUNCTION PANEL
ALT: 11 (ref 10–40)
AST: 15 (ref 14–40)
Alkaline Phosphatase: 52 (ref 25–125)
Bilirubin, Total: 0.7

## 2020-12-26 LAB — CBC AND DIFFERENTIAL
HCT: 40 — AB (ref 41–53)
Hemoglobin: 13.8 (ref 13.5–17.5)
Neutrophils Absolute: 6831
Platelets: 221 (ref 150–399)
WBC: 9.8

## 2020-12-26 LAB — BASIC METABOLIC PANEL
BUN: 20 (ref 4–21)
CO2: 23 — AB (ref 13–22)
Chloride: 97 — AB (ref 99–108)
Creatinine: 1.3 (ref 0.6–1.3)
Glucose: 81
Potassium: 4.3 (ref 3.4–5.3)
Sodium: 130 — AB (ref 137–147)

## 2020-12-26 LAB — COMPREHENSIVE METABOLIC PANEL
Albumin: 3.2 — AB (ref 3.5–5.0)
Calcium: 8.7 (ref 8.7–10.7)
Globulin: 2.4

## 2020-12-26 LAB — CBC: RBC: 4.1 (ref 3.87–5.11)

## 2020-12-26 LAB — TSH: TSH: 3.9 (ref 0.41–5.90)

## 2020-12-29 DIAGNOSIS — I639 Cerebral infarction, unspecified: Secondary | ICD-10-CM | POA: Diagnosis not present

## 2020-12-29 DIAGNOSIS — R1310 Dysphagia, unspecified: Secondary | ICD-10-CM | POA: Diagnosis not present

## 2020-12-29 DIAGNOSIS — R41841 Cognitive communication deficit: Secondary | ICD-10-CM | POA: Diagnosis not present

## 2020-12-29 DIAGNOSIS — R1312 Dysphagia, oropharyngeal phase: Secondary | ICD-10-CM | POA: Diagnosis not present

## 2020-12-29 DIAGNOSIS — K922 Gastrointestinal hemorrhage, unspecified: Secondary | ICD-10-CM | POA: Diagnosis not present

## 2020-12-30 ENCOUNTER — Non-Acute Institutional Stay (SKILLED_NURSING_FACILITY): Payer: Medicare Other | Admitting: Internal Medicine

## 2020-12-30 DIAGNOSIS — I6300 Cerebral infarction due to thrombosis of unspecified precerebral artery: Secondary | ICD-10-CM

## 2020-12-30 DIAGNOSIS — I480 Paroxysmal atrial fibrillation: Secondary | ICD-10-CM

## 2020-12-30 DIAGNOSIS — E039 Hypothyroidism, unspecified: Secondary | ICD-10-CM

## 2020-12-30 DIAGNOSIS — R1310 Dysphagia, unspecified: Secondary | ICD-10-CM | POA: Diagnosis not present

## 2020-12-30 DIAGNOSIS — I350 Nonrheumatic aortic (valve) stenosis: Secondary | ICD-10-CM | POA: Diagnosis not present

## 2020-12-30 DIAGNOSIS — K922 Gastrointestinal hemorrhage, unspecified: Secondary | ICD-10-CM | POA: Diagnosis not present

## 2020-12-30 DIAGNOSIS — R600 Localized edema: Secondary | ICD-10-CM

## 2020-12-30 DIAGNOSIS — I10 Essential (primary) hypertension: Secondary | ICD-10-CM | POA: Diagnosis not present

## 2020-12-30 DIAGNOSIS — R41841 Cognitive communication deficit: Secondary | ICD-10-CM | POA: Diagnosis not present

## 2020-12-30 DIAGNOSIS — I639 Cerebral infarction, unspecified: Secondary | ICD-10-CM | POA: Diagnosis not present

## 2020-12-30 DIAGNOSIS — N183 Chronic kidney disease, stage 3 unspecified: Secondary | ICD-10-CM | POA: Diagnosis not present

## 2020-12-30 DIAGNOSIS — R1312 Dysphagia, oropharyngeal phase: Secondary | ICD-10-CM | POA: Diagnosis not present

## 2020-12-30 DIAGNOSIS — E785 Hyperlipidemia, unspecified: Secondary | ICD-10-CM

## 2020-12-30 DIAGNOSIS — G4700 Insomnia, unspecified: Secondary | ICD-10-CM

## 2020-12-30 NOTE — Progress Notes (Signed)
Provider:   Location:  [facility] manager of Service:  SNF (31)  PCP: Mast, Man X, NP Patient Care Team: Mast, Man X, NP as PCP - General (Internal Medicine) Belva Crome, MD as PCP - Cardiology (Cardiology)  Extended Emergency Contact Information Primary Emergency Contact: Faas,Jean Address: [address]          [place], [place] Phone: [phone] Relation: Spouse Secondary Emergency Contact: Alan Ripper Address: [place], [place] [address] [place] Phone: [phone] Relation: Daughter  Code Status: DNR Goals of Care: Advanced Directive information Advanced Directives 01/02/2021  Does Patient Have a Medical Advance Directive? Yes  Type of Paramedic of [place];Living will  Does patient want to make changes to medical advance directive? No - Patient declined  Copy of [place] in Chart? Yes - validated most recent copy scanned in chart (See row information)  Would patient like information on creating a medical advance directive? -      Chief Complaint  Patient presents with   New Admit To SNF    HPI: Patient is a 87 y.o. male seen today for admission to SNF for Long term Care  Has h/o PAF Continues to have issues with tachybradycardia. Has refused pacemaker. Not on any anticoagulation. Not on any rate slowing medicines follows with cardiology  Lower extremity edema due to diastolic HF Worsening Recently Also noticed swelling in his UE Looks SOB but denying any cough or Chest pain  H/o Constipation, Hypothyroidism,h/o CVA CAD ,CKD Hypertension Insomnia and Urinary Frequency  Patient was having recurrent falls in his room.  Daughter has decided to move him to long-term care.  Mostly stays in wheelchair can do his transfers but cannot walk right now  Past Medical History:  Diagnosis Date   Atrial fibrillation (HCC)    CAD (coronary  artery disease)    Diverticulosis    Hypercholesteremia    Hypertension    Hypothyroidism    Insomnia    Murmur    Paroxysmal atrial fibrillation (HCC)    Urinary dysfunction    Past Surgical History:  Procedure Laterality Date   BYPASS GRAFT     HEMORRHOID SURGERY     HERNIA REPAIR     HIP SURGERY      reports that he has quit smoking. He has never used smokeless tobacco. He reports current alcohol use. He reports that he does not use drugs. Social History   Socioeconomic History   Marital status: Married    Spouse name: Not on file   Number of children: Not on file   Years of education: Not on file   Highest education level: Not on file  Occupational History   Not on file  Tobacco Use   Smoking status: Former   Smokeless tobacco: Never  Substance and Sexual Activity   Alcohol use: Yes    Comment: occasional   Drug use: No   Sexual activity: Not Currently  Other Topics Concern   Not on file  Social History Narrative   Social History     Tobacco Use       Smoking status: None       Smokeless tobacco: Never Used     Alcohol use: No       Alcohol/week: 0.0 standard drinks     Drug use: No   Diet is good  No caffeine   Married since 1952   Lives in apartment, one story   Lives with full time home health   Pet- Cat (Tai)   Highest level of education: PHD   Past profession- University professor   Some exercise-upper arm      Living Will-Yes   DNR-Yes   POA/HPOA- Yes      Difficulty bathing, dressing, preparing, eating, managing medications, and managing finances.   No difficulty affording medications   Social Determinants of Health   Financial Resource Strain: Not on file  Food Insecurity: Not on file  Transportation Needs: Not on file  Physical Activity: Not on file  Stress: Not on file  Social Connections: Not on file  Intimate Partner Violence: Not on file    Functional Status Survey:    Family History  Problem Relation Age of Onset    Heart disease Mother    Cancer Father    ALS Brother    Heart disease Daughter     Health Maintenance  Topic Date Due   PNA vac Low Risk Adult (2 of 2 - PCV13) 08/12/2013   TETANUS/TDAP  06/14/2014   Zoster Vaccines- Shingrix (2 of 2) 07/28/2017   COVID-19 Vaccine (3 - Booster for Moderna series) 12/13/2019   INFLUENZA VACCINE  01/12/2021   HPV VACCINES  Aged Out    Allergies  Allergen Reactions   Shellfish Allergy Swelling and Rash    Outpatient Encounter Medications as of 12/30/2020  Medication Sig   Ascorbic Acid (VITAMIN C) 1000 MG tablet Take 1,000 mg by mouth in the morning and at bedtime.   aspirin 81 MG EC tablet in the morning and at bedtime.    cholecalciferol (VITAMIN D) 1000 UNITS tablet Take 1,000 Units by mouth daily.   levothyroxine (SYNTHROID, LEVOTHROID) 25 MCG tablet Take 25 mcg by mouth daily before breakfast.   Multiple Vitamins-Minerals (CENTRUM SILVER PO) Take by mouth daily.   Multiple Vitamins-Minerals (PRESERVISION AREDS 2 PO) Take by mouth daily.   Omega-3 1000 MG CAPS Take by mouth in the morning and at bedtime.   Probiotic Product (PROBIOTIC PO) Take by mouth daily.   telmisartan (MICARDIS) 80 MG tablet Take 1 tablet (80 mg total) by mouth daily. Please make yearly appt with Dr. Tamala Julian for June 2022 for future refills. Thank you 1st attempt (Patient taking differently: Take 80 mg by mouth daily.)   zolpidem (AMBIEN) 5 MG tablet TAKE ONE TABLET AT BEDTIME AS NEEDED FOR SLEEP.   [DISCONTINUED] furosemide (LASIX) 20 MG tablet Take 1 tablet (20 mg total) by mouth daily. Please make overdue appt with Dr. Tamala Julian for June 2022 for future refills. Thank you 1st attempt (Patient taking differently: Take 20 mg by mouth daily.)   [DISCONTINUED] mirabegron ER (MYRBETRIQ) 50 MG TB24 tablet Take 1 tablet (50 mg total) by mouth daily.   No facility-administered encounter medications on file as of 12/30/2020.    Review of Systems  Constitutional:  Positive for activity  change.  HENT: Negative.    Respiratory: Negative.    Cardiovascular:  Positive for leg swelling.  Gastrointestinal:  Positive for constipation.  Genitourinary:  Positive for frequency.  Musculoskeletal:  Positive for gait problem.  Skin: Negative.   Neurological:  Positive for weakness.  Psychiatric/Behavioral: Negative.     Vitals:   01/04/21 1820  BP: 132/75  Pulse: 60  Resp: 20  Temp: 98.6 F (37 C)  Weight: 171 lb (77.6 kg)   Body mass index is 29.35 kg/m.  Physical Exam Constitutional: Oriented to person, place, and time. Well-developed and well-nourished.  HENT:  Head: Normocephalic.  Mouth/Throat: Oropharynx is clear and moist.  Eyes: Pupils are equal, round, and reactive to light.  Neck: Neck supple.  Cardiovascular: Normal rate and normal heart sounds.  murmur heard. Pulmonary/Chest: Effort normal and breath sounds normal. No respiratory distress. No wheezes. Bilateral Rales Heard  Abdominal: Soft. Bowel sounds are normal. No distension. There is no tenderness. There is no rebound.  Musculoskeletal:Moderate Edema Bilateral Lymphadenopathy: none Neurological: Alert and oriented to person, place, and time. No Focal Deficits Skin: Skin is warm and dry.  Psychiatric: Normal mood and affect. Behavior is normal. Thought content normal.    Labs reviewed: Basic Metabolic Panel: Recent Labs    05/05/20 0745 11/22/20 1129 12/26/20 0000  NA 133* 130* 130*  K 4.7 4.0 4.3  CL 99 99 97*  CO2 26 22 23*  GLUCOSE 100* 89  --   BUN 29* 20 20  CREATININE 1.40* 1.25* 1.3  CALCIUM 9.1 8.5* 8.7   Liver Function Tests: Recent Labs    05/05/20 0745 11/22/20 1129 12/26/20 0000  AST _0 ALT _1 ALKPHOS  --  55 52  BILITOT 0.6 0.6  --   PROT 6.3 6.4*  --   ALBUMIN  --  3.3* 3.2*   No results for input(s): LIPASE, AMYLASE in the last 8760 hours. No results for input(s): AMMONIA in the last 8760 hours. CBC: Recent Labs    05/05/20 0745 12/26/20 0000   WBC 5.0 9.8  NEUTROABS 2,725 6,831.00  HGB 14.4 13.8  HCT 41.9 40*  MCV 96.3  --   PLT 200 221   Cardiac Enzymes: No results for input(s): CKTOTAL, CKMB, CKMBINDEX, TROPONINI in the last 8760 hours. BNP: Invalid input(s): POCBNP No results found for: HGBA1C Lab Results  Component Value Date   TSH 3.90 12/26/2020   No results found for: VITAMINB12 No results found for: FOLATE No results found for: IRON, TIBC, FERRITIN  Imaging and Procedures obtained prior to SNF admission: No results found.  Assessment/Plan Paroxysmal atrial fibrillation (HCC) Seems in Normal Rythm Bilateral leg edema Increase Lasix to 40 mg Weight Q weekly Repeat BMP in 1 week  Insomnia, unspecified type Takes Ambien Hypothyroidism, unspecified type TSH Pending Hyperlipidemia, u Is off statin now  Stage 3 chronic kidney disease,  Creat stable  Primary hypertension BP stable On Micardis Will follow closely after incraseing dose of Lasix  Cerebrovascular accident (CVA) due to thrombosis of precerebral artery ([place]) On aspirin No Anticoagulation Nonrheumatic aortic valve stenosis stable   Family/ staff Communication:   Labs/tests ordered:

## 2020-12-31 DIAGNOSIS — R1310 Dysphagia, unspecified: Secondary | ICD-10-CM | POA: Diagnosis not present

## 2020-12-31 DIAGNOSIS — K922 Gastrointestinal hemorrhage, unspecified: Secondary | ICD-10-CM | POA: Diagnosis not present

## 2020-12-31 DIAGNOSIS — R1312 Dysphagia, oropharyngeal phase: Secondary | ICD-10-CM | POA: Diagnosis not present

## 2020-12-31 DIAGNOSIS — I639 Cerebral infarction, unspecified: Secondary | ICD-10-CM | POA: Diagnosis not present

## 2020-12-31 DIAGNOSIS — R41841 Cognitive communication deficit: Secondary | ICD-10-CM | POA: Diagnosis not present

## 2021-01-02 ENCOUNTER — Encounter: Payer: Self-pay | Admitting: Internal Medicine

## 2021-01-02 NOTE — Progress Notes (Signed)
Provider:  Veleta Miners MD Location:   [facility] Room Number: 18 Place of Service:  SNF (52)  PCP: Mast, Man X, NP Patient Care Team: Mast, Man X, NP as PCP - General (Internal Medicine) Belva Crome, MD as PCP - Cardiology (Cardiology)  Extended Emergency Contact Information Primary Emergency Contact: Carson Myrtle Address: [address]          [place], [place] Phone: [phone] Relation: Spouse Secondary Emergency Contact: Alan Ripper Address: [place], [place] [address] [place] Phone: [phone] Relation: Daughter  Code Status: DNR Goals of Care: Advanced Directive information Advanced Directives 01/02/2021  Does Patient Have a Medical Advance Directive? Yes  Type of Paramedic of [place];Living will  Does patient want to make changes to medical advance directive? No - Patient declined  Copy of [place] in Chart? Yes - validated most recent copy scanned in chart (See row information)  Would patient like information on creating a medical advance directive? -      Chief Complaint  Patient presents with   New Admit To SNF    Admission to SNF    HPI: Patient is a 87 y.o. male seen today for admission to  Past Medical History:  Diagnosis Date   Atrial fibrillation ([place])    CAD (coronary artery disease)    Diverticulosis    Hypercholesteremia    Hypertension    Hypothyroidism    Insomnia    Murmur    Paroxysmal atrial fibrillation ([place])    Urinary dysfunction    Past Surgical History:  Procedure Laterality Date   BYPASS GRAFT     [place]      reports that he has quit smoking. He has never used smokeless tobacco. He reports current alcohol use. He reports that he does not use drugs. Social History   Socioeconomic History   Marital status: Married    Spouse name:  Not on file   Number of children: Not on file   Years of education: Not on file   Highest education level: Not on file  Occupational History   Not on file  Tobacco Use   Smoking status: Former   Smokeless tobacco: Never  Substance and Sexual Activity   Alcohol use: Yes    Comment: occasional   Drug use: No   Sexual activity: Not Currently  Other Topics Concern   Not on file  Social History Narrative   Social History     Tobacco Use       Smoking status: None       Smokeless tobacco: Never Used     Alcohol use: No       Alcohol/week: 0.0 standard drinks     Drug use: No   Diet is good   No caffeine   Married since [place] in apartment, one story   Lives with full time home health   Pet- Cat (Tai)   Highest level of education: PHD   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   No difficulty affording medications   Social Determinants of Health  Financial Resource Strain: Not on file  Food Insecurity: Not on file  Transportation Needs: Not on file  Physical Activity: Not on file  Stress: Not on file  Social Connections: Not on file  Intimate Partner Violence: Not on file    Functional Status Survey:    Family History  Problem Relation Age of Onset   Heart disease Mother    Cancer Father    ALS Brother    Heart disease Daughter     Health Maintenance  Topic Date Due   PNA vac Low Risk Adult (2 of 2 - PCV13) 08/12/2013   TETANUS/TDAP  06/14/2014   Zoster Vaccines- Shingrix (2 of 2) 07/28/2017   COVID-19 Vaccine (3 - Booster for Moderna series) 12/13/2019   INFLUENZA VACCINE  01/12/2021   HPV VACCINES  Aged Out    Allergies  Allergen Reactions   Shellfish Allergy Swelling and Rash    Allergies as of 01/02/2021       Reactions   Shellfish Allergy Swelling, Rash         Medication List        Accurate as of January 02, 2021  2:49 PM. If you have any questions, ask your nurse or doctor.          aspirin 81 MG EC tablet in the morning and at bedtime.   cholecalciferol 1000 units tablet Commonly known as: VITAMIN D Take 1,000 Units by mouth daily.   furosemide 40 MG tablet Commonly known as: LASIX Take 40 mg by mouth daily. What changed: Another medication with the same name was removed. Continue taking this medication, and follow the directions you see here. Changed by: Virgie Dad, MD   levothyroxine 25 MCG tablet Commonly known as: SYNTHROID Take 25 mcg by mouth daily before breakfast.   Omega-3 1000 MG Caps Take by mouth in the morning and at bedtime.   polyethylene glycol 17 g packet Commonly known as: MIRALAX / GLYCOLAX Take 17 g by mouth daily.   PRESERVISION AREDS 2 PO Take by mouth daily.   CENTRUM SILVER PO Take by mouth daily.   PROBIOTIC PO Take by mouth daily.   senna 8.6 MG tablet Commonly known as: SENOKOT Take 2 tablets by mouth at bedtime.   telmisartan 80 MG tablet Commonly known as: Micardis Take 1 tablet (80 mg total) by mouth daily. Please make yearly appt with Dr. Tamala Julian for June 2022 for future refills. Thank you 1st attempt What changed: additional instructions   vitamin C 1000 MG tablet Take 1,000 mg by mouth in the morning and at bedtime.   zolpidem 5 MG tablet Commonly known as: AMBIEN TAKE ONE TABLET AT BEDTIME AS NEEDED FOR SLEEP.        Review of Systems  Vitals:   01/02/21 1439  BP: 108/70  Pulse: (!) 53  Temp: 98 F (36.7 C)  SpO2: 96%  Weight: 170 lb 8 oz (77.3 kg)  Height: '5\' 4"'  (1.626 m)   Body mass index is 29.27 kg/m. Physical Exam  Labs reviewed: Basic Metabolic Panel: Recent Labs    05/05/20 0745 11/22/20 1129 12/26/20 0000  NA 133* 130* 130*  K 4.7 4.0 4.3  CL 99 99 97*  CO2 26 22 23*  GLUCOSE 100* 89  --   BUN 29* 20 20  CREATININE 1.40* 1.25* 1.3   CALCIUM 9.1 8.5* 8.7   Liver Function Tests: Recent Labs    05/05/20 0745 11/22/20 1129 12/26/20 0000  AST 15 18  15  ALT '12 9 11  ' ALKPHOS  --  55 52  BILITOT 0.6 0.6  --   PROT 6.3 6.4*  --   ALBUMIN  --  3.3* 3.2*   No results for input(s): LIPASE, AMYLASE in the last 8760 hours. No results for input(s): AMMONIA in the last 8760 hours. CBC: Recent Labs    05/05/20 0745 12/26/20 0000  WBC 5.0 9.8  NEUTROABS 2,725 6,831.00  HGB 14.4 13.8  HCT 41.9 40*  MCV 96.3  --   PLT 200 221   Cardiac Enzymes: No results for input(s): CKTOTAL, CKMB, CKMBINDEX, TROPONINI in the last 8760 hours. BNP: Invalid input(s): POCBNP No results found for: HGBA1C Lab Results  Component Value Date   TSH 3.90 12/26/2020   No results found for: VITAMINB12 No results found for: FOLATE No results found for: IRON, TIBC, FERRITIN  Imaging and Procedures obtained prior to SNF admission: No results found.  Assessment/Plan There are no diagnoses linked to this encounter.   Family/ staff Communication:   Labs/tests ordered:

## 2021-01-04 ENCOUNTER — Encounter: Payer: Self-pay | Admitting: Internal Medicine

## 2021-01-04 DIAGNOSIS — R41841 Cognitive communication deficit: Secondary | ICD-10-CM | POA: Diagnosis not present

## 2021-01-04 DIAGNOSIS — R1310 Dysphagia, unspecified: Secondary | ICD-10-CM | POA: Diagnosis not present

## 2021-01-04 DIAGNOSIS — K922 Gastrointestinal hemorrhage, unspecified: Secondary | ICD-10-CM | POA: Diagnosis not present

## 2021-01-04 DIAGNOSIS — I639 Cerebral infarction, unspecified: Secondary | ICD-10-CM | POA: Diagnosis not present

## 2021-01-04 DIAGNOSIS — R1312 Dysphagia, oropharyngeal phase: Secondary | ICD-10-CM | POA: Diagnosis not present

## 2021-01-06 DIAGNOSIS — N183 Chronic kidney disease, stage 3 unspecified: Secondary | ICD-10-CM | POA: Diagnosis not present

## 2021-01-06 DIAGNOSIS — I10 Essential (primary) hypertension: Secondary | ICD-10-CM | POA: Diagnosis not present

## 2021-01-07 ENCOUNTER — Encounter: Payer: Self-pay | Admitting: Nurse Practitioner

## 2021-01-07 DIAGNOSIS — E871 Hypo-osmolality and hyponatremia: Secondary | ICD-10-CM | POA: Insufficient documentation

## 2021-01-08 DIAGNOSIS — Z20822 Contact with and (suspected) exposure to covid-19: Secondary | ICD-10-CM | POA: Diagnosis not present

## 2021-01-13 DIAGNOSIS — M6281 Muscle weakness (generalized): Secondary | ICD-10-CM | POA: Diagnosis not present

## 2021-01-13 DIAGNOSIS — N3946 Mixed incontinence: Secondary | ICD-10-CM | POA: Diagnosis not present

## 2021-01-13 DIAGNOSIS — R1312 Dysphagia, oropharyngeal phase: Secondary | ICD-10-CM | POA: Diagnosis not present

## 2021-01-13 DIAGNOSIS — R29898 Other symptoms and signs involving the musculoskeletal system: Secondary | ICD-10-CM | POA: Diagnosis not present

## 2021-01-13 DIAGNOSIS — R41841 Cognitive communication deficit: Secondary | ICD-10-CM | POA: Diagnosis not present

## 2021-01-13 DIAGNOSIS — I10 Essential (primary) hypertension: Secondary | ICD-10-CM | POA: Diagnosis not present

## 2021-01-13 DIAGNOSIS — K922 Gastrointestinal hemorrhage, unspecified: Secondary | ICD-10-CM | POA: Diagnosis not present

## 2021-01-13 DIAGNOSIS — I639 Cerebral infarction, unspecified: Secondary | ICD-10-CM | POA: Diagnosis not present

## 2021-01-13 DIAGNOSIS — R1310 Dysphagia, unspecified: Secondary | ICD-10-CM | POA: Diagnosis not present

## 2021-01-13 DIAGNOSIS — R2681 Unsteadiness on feet: Secondary | ICD-10-CM | POA: Diagnosis not present

## 2021-01-14 DIAGNOSIS — Z8719 Personal history of other diseases of the digestive system: Secondary | ICD-10-CM | POA: Diagnosis not present

## 2021-01-14 DIAGNOSIS — E039 Hypothyroidism, unspecified: Secondary | ICD-10-CM | POA: Diagnosis not present

## 2021-01-14 DIAGNOSIS — N183 Chronic kidney disease, stage 3 unspecified: Secondary | ICD-10-CM | POA: Diagnosis not present

## 2021-01-15 ENCOUNTER — Other Ambulatory Visit: Payer: Self-pay

## 2021-01-16 LAB — CBC WITH DIFFERENTIAL/PLATELET
Absolute Monocytes: 903 cells/uL (ref 200–950)
Basophils Absolute: 59 cells/uL (ref 0–200)
Basophils Relative: 1 %
Eosinophils Absolute: 979 cells/uL — ABNORMAL HIGH (ref 15–500)
Eosinophils Relative: 16.6 %
HCT: 40.6 % (ref 38.5–50.0)
Hemoglobin: 13.6 g/dL (ref 13.2–17.1)
Lymphs Abs: 1038 cells/uL (ref 850–3900)
MCH: 32.5 pg (ref 27.0–33.0)
MCHC: 33.5 g/dL (ref 32.0–36.0)
MCV: 96.9 fL (ref 80.0–100.0)
MPV: 11 fL (ref 7.5–12.5)
Monocytes Relative: 15.3 %
Neutro Abs: 2921 cells/uL (ref 1500–7800)
Neutrophils Relative %: 49.5 %
Platelets: 254 10*3/uL (ref 140–400)
RBC: 4.19 10*6/uL — ABNORMAL LOW (ref 4.20–5.80)
RDW: 13.1 % (ref 11.0–15.0)
Total Lymphocyte: 17.6 %
WBC: 5.9 10*3/uL (ref 3.8–10.8)

## 2021-01-16 LAB — COMPLETE METABOLIC PANEL WITH GFR
AG Ratio: 1.2 (calc) (ref 1.0–2.5)
ALT: 11 U/L (ref 9–46)
AST: 16 U/L (ref 10–35)
Albumin: 3.4 g/dL — ABNORMAL LOW (ref 3.6–5.1)
Alkaline phosphatase (APISO): 64 U/L (ref 35–144)
BUN/Creatinine Ratio: 19 (calc) (ref 6–22)
BUN: 25 mg/dL (ref 7–25)
CO2: 27 mmol/L (ref 20–32)
Calcium: 8.8 mg/dL (ref 8.6–10.3)
Chloride: 100 mmol/L (ref 98–110)
Creat: 1.29 mg/dL — ABNORMAL HIGH (ref 0.70–1.22)
Globulin: 2.8 g/dL (calc) (ref 1.9–3.7)
Glucose, Bld: 89 mg/dL (ref 65–99)
Potassium: 4.2 mmol/L (ref 3.5–5.3)
Sodium: 134 mmol/L — ABNORMAL LOW (ref 135–146)
Total Bilirubin: 0.6 mg/dL (ref 0.2–1.2)
Total Protein: 6.2 g/dL (ref 6.1–8.1)
eGFR: 50 mL/min/{1.73_m2} — ABNORMAL LOW (ref >=60)

## 2021-01-16 LAB — TSH: TSH: 3.13 mIU/L (ref 0.40–4.50)

## 2021-01-19 ENCOUNTER — Other Ambulatory Visit: Payer: Self-pay | Admitting: *Deleted

## 2021-01-19 MED ORDER — ZOLPIDEM TARTRATE 5 MG PO TABS: ORAL_TABLET | ORAL | 0 refills | Status: DC

## 2021-01-19 NOTE — Telephone Encounter (Signed)
[facility] Requested refill.

## 2021-01-20 DIAGNOSIS — R1312 Dysphagia, oropharyngeal phase: Secondary | ICD-10-CM | POA: Diagnosis not present

## 2021-01-20 DIAGNOSIS — R41841 Cognitive communication deficit: Secondary | ICD-10-CM | POA: Diagnosis not present

## 2021-01-20 DIAGNOSIS — R1310 Dysphagia, unspecified: Secondary | ICD-10-CM | POA: Diagnosis not present

## 2021-01-20 DIAGNOSIS — I639 Cerebral infarction, unspecified: Secondary | ICD-10-CM | POA: Diagnosis not present

## 2021-01-20 DIAGNOSIS — M6281 Muscle weakness (generalized): Secondary | ICD-10-CM | POA: Diagnosis not present

## 2021-01-20 DIAGNOSIS — K922 Gastrointestinal hemorrhage, unspecified: Secondary | ICD-10-CM | POA: Diagnosis not present

## 2021-01-21 ENCOUNTER — Encounter: Payer: Self-pay | Admitting: Nurse Practitioner

## 2021-01-21 ENCOUNTER — Encounter: Payer: Self-pay | Admitting: Family Medicine

## 2021-01-21 ENCOUNTER — Non-Acute Institutional Stay (SKILLED_NURSING_FACILITY): Payer: Medicare Other | Admitting: Nurse Practitioner

## 2021-01-21 DIAGNOSIS — I480 Paroxysmal atrial fibrillation: Secondary | ICD-10-CM

## 2021-01-21 DIAGNOSIS — N183 Chronic kidney disease, stage 3 unspecified: Secondary | ICD-10-CM

## 2021-01-21 DIAGNOSIS — I2510 Atherosclerotic heart disease of native coronary artery without angina pectoris: Secondary | ICD-10-CM

## 2021-01-21 DIAGNOSIS — Z8719 Personal history of other diseases of the digestive system: Secondary | ICD-10-CM

## 2021-01-21 DIAGNOSIS — I10 Essential (primary) hypertension: Secondary | ICD-10-CM

## 2021-01-21 DIAGNOSIS — I6300 Cerebral infarction due to thrombosis of unspecified precerebral artery: Secondary | ICD-10-CM | POA: Diagnosis not present

## 2021-01-21 DIAGNOSIS — E039 Hypothyroidism, unspecified: Secondary | ICD-10-CM | POA: Diagnosis not present

## 2021-01-21 DIAGNOSIS — D6859 Other primary thrombophilia: Secondary | ICD-10-CM

## 2021-01-21 DIAGNOSIS — E785 Hyperlipidemia, unspecified: Secondary | ICD-10-CM

## 2021-01-21 DIAGNOSIS — G4700 Insomnia, unspecified: Secondary | ICD-10-CM

## 2021-01-21 DIAGNOSIS — R350 Frequency of micturition: Secondary | ICD-10-CM | POA: Diagnosis not present

## 2021-01-21 DIAGNOSIS — K5901 Slow transit constipation: Secondary | ICD-10-CM

## 2021-01-21 DIAGNOSIS — I270 Primary pulmonary hypertension: Secondary | ICD-10-CM

## 2021-01-21 NOTE — Assessment & Plan Note (Signed)
heart rate is in control. EKG 12/05/19 Afib at cardiology, only takes ASA, TSH 3.13 01/14/21

## 2021-01-21 NOTE — Assessment & Plan Note (Signed)
Stable, takes MiraLax, Senokot S 

## 2021-01-21 NOTE — Assessment & Plan Note (Signed)
takes Levothyroxine, TSH 3.13 01/14/21

## 2021-01-21 NOTE — Assessment & Plan Note (Signed)
Bun/creat 25/1.29 eGFR 50 01/15/21

## 2021-01-21 NOTE — Assessment & Plan Note (Signed)
no difference since off Mirabegran due to cost, had urinary retention, s/p ED eval 11/22/20, f/u urology 

## 2021-01-21 NOTE — Assessment & Plan Note (Signed)
Hx of GI bleed, Hgb 13.6 01/14/21

## 2021-01-21 NOTE — Assessment & Plan Note (Signed)
LDL 107 05/05/20

## 2021-01-21 NOTE — Progress Notes (Signed)
Location:   [facility] Room Number: [phone] Place of Service:  SNF (31) Provider:  Zayn Selley X Elysha Daw, NP  Billy Barnes X, NP  Patient Care Team: Xzavien Harada X, NP as PCP - General (Internal Medicine) Belva Crome, MD as PCP - Cardiology (Cardiology)  Extended Emergency Contact Information Primary Emergency Contact: Carson Myrtle Address: [address]          [place], [place] Phone: [phone] Relation: Spouse Secondary Emergency Contact: Alan Ripper Address: [place], [place] [address] [place] Phone: [phone] Relation: Daughter  Code Status:  DNR Goals of care: Advanced Directive information Advanced Directives 01/21/2021  Does Patient Have a Medical Advance Directive? Yes  Type of Paramedic of [place];Living will  Does patient want to make changes to medical advance directive? No - Patient declined  Copy of [place] in Chart? Yes - validated most recent copy scanned in chart (See row information)  Would patient like information on creating a medical advance directive? -     Chief Complaint  Patient presents with   Medical Management of Chronic Issues    Routine follow up    Health Maintenance    Discuss need for PNA vaccine, td/tdap vaccine, and shingles vaccine.    HPI:  Pt is a 87 y.o. male seen today for medical management of chronic diseases.     Afib, heart rate is in control. EKG 12/05/19 Afib at cardiology, only takes ASA, TSH 3.13 01/14/21             Thrombophilia, had work up in the past, venous US negative DVT in legs.               Urinary frequency, no difference since off Mirabegran due to cost, had urinary retention, s/p ED eval 11/22/20, f/u urology             CAD, ASA, Fish oil, Coronary artery bypass grafting 1992             Hx of GI bleed, Hgb 13.6 01/14/21             CVA, no apparent focal weakness.             HTN,  takes Telmisartan Pulmonary hypertension/edema BLE, takes Furosemide, [place] 6/18/21negative DVT Insomnia, takes Zolpidem             Hypothyroidism, takes Levothyroxine, TSH 3.13 01/14/21             CKD Bun/creat 25/1.29 eGFR 50 01/15/21             Hyperlipidemia, LDL 107 05/05/20             Constipation, takes MiraLax, Senokot S  Past Medical History:  Diagnosis Date   Atrial fibrillation (HCC)    CAD (coronary artery disease)    Diverticulosis    Hypercholesteremia    Hypertension    Hypothyroidism    Insomnia    Murmur    Paroxysmal atrial fibrillation (HCC)    Urinary dysfunction    Past Surgical History:  Procedure Laterality Date   BYPASS GRAFT     HEMORRHOID SURGERY     HERNIA REPAIR     HIP SURGERY      Allergies  Allergen Reactions   Shellfish Allergy Swelling and Rash    Allergies as of 01/21/2021  Reactions   Shellfish Allergy Swelling, Rash        Medication List        Accurate as of January 21, 2021  4:31 PM. If you have any questions, ask your nurse or doctor.          STOP taking these medications    PROBIOTIC PO Stopped by: Jadelynn Boylan X Zameria Vogl, NP       TAKE these medications    aspirin 81 MG EC tablet in the morning and at bedtime.   cholecalciferol 1000 units tablet Commonly known as: VITAMIN D Take 1,000 Units by mouth daily.   Florastor 250 MG capsule Generic drug: saccharomyces boulardii Take 250 mg by mouth daily.   furosemide 40 MG tablet Commonly known as: LASIX Take 40 mg by mouth daily.   levothyroxine 25 MCG tablet Commonly known as: SYNTHROID Take 25 mcg by mouth daily before breakfast.   Omega-3 1000 MG Caps Take by mouth in the morning and at bedtime.   polyethylene glycol 17 g packet Commonly known as: MIRALAX / GLYCOLAX Take 17 g by mouth daily.   PRESERVISION AREDS 2 PO Take by mouth daily.   CENTRUM SILVER PO Take by mouth daily.   senna 8.6 MG tablet Commonly known as: SENOKOT Take 2 tablets by  mouth at bedtime.   telmisartan 80 MG tablet Commonly known as: Micardis Take 1 tablet (80 mg total) by mouth daily. Please make yearly appt with Dr. Tamala Julian for June 2022 for future refills. Thank you 1st attempt What changed: additional instructions   vitamin C 1000 MG tablet Take 1,000 mg by mouth in the morning and at bedtime.   zolpidem 5 MG tablet Commonly known as: AMBIEN TAKE ONE TABLET AT BEDTIME AS NEEDED FOR SLEEP.        Review of Systems  Constitutional:  Negative for fatigue, fever and unexpected weight change.  HENT:  Positive for hearing loss. Negative for congestion and voice change.   Respiratory:  Positive for shortness of breath. Negative for cough and wheezing.        Occasional nocturnal orthopnea   Cardiovascular:  Positive for leg swelling. Negative for chest pain and palpitations.  Gastrointestinal:  Negative for abdominal pain and constipation.  Genitourinary:  Positive for frequency. Negative for difficulty urinating and urgency.  Musculoskeletal:  Positive for arthralgias and gait problem.  Neurological:  Negative for speech difficulty, weakness and light-headedness.  Psychiatric/Behavioral:  Negative for sleep disturbance. The patient is not nervous/anxious.    Immunization History  Administered Date(s) Administered   Influenza, High Dose Seasonal PF 04/11/2017, 03/26/2020   Influenza-Unspecified 03/26/2020   Moderna Sars-Covid-2 Vaccination 06/18/2019, 07/16/2019   Pneumococcal-Unspecified 08/12/2012   Tetanus 06/14/2004   Zoster Recombinat (Shingrix) 06/02/2017   Pertinent  Health Maintenance Due  Topic Date Due   PNA vac Low Risk Adult (2 of 2 - PCV13) 08/12/2013   INFLUENZA VACCINE  01/12/2021   Fall Risk  09/18/2020 08/12/2020 05/16/2020  Falls in the past year? 0 0 0  Number falls in past yr: 0 0 0  Injury with Fall? 0 - -   Functional Status Survey:    Vitals:   01/21/21 1537  BP: 108/70  Pulse: 82  Resp: 16  Temp: 98.2 F (36.8  C)  SpO2: 95%  Weight: 163 lb 6.4 oz (74.1 kg)  Height: '5\' 4"'  (1.626 m)   Body mass index is 28.05 kg/m. Physical Exam Vitals and nursing note reviewed.  Constitutional:  Appearance: Normal appearance.  HENT:     Head: Normocephalic and atraumatic.     Mouth/Throat:     Mouth: Mucous membranes are moist.  Eyes:     Extraocular Movements: Extraocular movements intact.     Conjunctiva/sclera: Conjunctivae normal.     Pupils: Pupils are equal, round, and reactive to light.  Cardiovascular:     Rate and Rhythm: Bradycardia present. Rhythm irregular.     Heart sounds: Murmur heard.  Pulmonary:     Effort: Pulmonary effort is normal.     Breath sounds: No rales.  Abdominal:     General: Bowel sounds are normal.     Palpations: Abdomen is soft.     Tenderness: There is no abdominal tenderness. There is no right CVA tenderness.  Musculoskeletal:     Cervical back: Normal range of motion and neck supple.     Right lower leg: Edema present.     Left lower leg: Edema present.     Comments: 1-2+  edema BLE L>R, uses TED sometimes.  Skin:    General: Skin is warm and dry.     Comments: A small SK injury right shoulder blade.   Neurological:     General: No focal deficit present.     Mental Status: He is alert and oriented to person, place, and time. Mental status is at baseline.     Motor: Weakness present.     Coordination: Coordination normal.     Gait: Gait abnormal.     Comments: Left sided weakness form previous CVA, muscle strength 5/5  Psychiatric:        Mood and Affect: Mood normal.        Behavior: Behavior normal.        Thought Content: Thought content normal.        Judgment: Judgment normal.    Labs reviewed: Recent Labs    05/05/20 0745 11/22/20 1129 12/26/20 0000 01/14/21 0843  NA 133* 130* 130* 134*  K 4.7 4.0 4.3 4.2  CL 99 99 97* 100  CO2 26 22 23* 27  GLUCOSE 100* 89  --  89  BUN 29* '20 20 25  ' CREATININE 1.40* 1.25* 1.3 1.29*  CALCIUM 9.1  8.5* 8.7 8.8   Recent Labs    05/05/20 0745 11/22/20 1129 12/26/20 0000 01/14/21 0843  AST '15 18 15 16  ' ALT '12 9 11 11  ' ALKPHOS  --  55 52  --   BILITOT 0.6 0.6  --  0.6  PROT 6.3 6.4*  --  6.2  ALBUMIN  --  3.3* 3.2*  --    Recent Labs    05/05/20 0745 12/26/20 0000 01/14/21 0843  WBC 5.0 9.8 5.9  NEUTROABS 2,725 6,831.00 2,921  HGB 14.4 13.8 13.6  HCT 41.9 40* 40.6  MCV 96.3  --  96.9  PLT 200 221 254   Lab Results  Component Value Date   TSH 3.13 01/14/2021   No results found for: HGBA1C Lab Results  Component Value Date   CHOL 183 05/05/2020   HDL 54 05/05/2020   LDLCALC 107 (H) 05/05/2020   TRIG 128 05/05/2020   CHOLHDL 3.4 05/05/2020    Significant Diagnostic Results in last 30 days:  No results found.  Assessment/Plan Paroxysmal atrial fibrillation heart rate is in control. EKG 12/05/19 Afib at cardiology, only takes ASA, TSH 3.13 01/14/21  Thrombophilia ([place]) had work up in the past, venous US negative DVT in legs.   Urinary frequency no  difference since off Mirabegran due to cost, had urinary retention, s/p ED eval 11/22/20, f/u urology  CAD (coronary artery disease) CAD, ASA, Fish oil, Coronary artery bypass grafting 1992  History of GI bleed Hx of GI bleed, Hgb 13.6 01/14/21  CVA (cerebral vascular accident) ([place]) no apparent focal weakness.  Hypertension Blood pressure is controlled, takes Telmisartan  Pulmonary hypertension, primary ([place]) Pulmonary hypertension/edema BLE, takes Furosemide, [place] 11/30/19 negative DVT  Insomnia Needs Zolpidem.   Hypothyroidism takes Levothyroxine, TSH 3.13 01/14/21  CKD (chronic kidney disease) stage 3, GFR 30-59 ml/min (HCC) Bun/creat 25/1.29 eGFR 50 01/15/21  Hyperlipidemia LDL 107 05/05/20  Slow transit constipation Stable, takes MiraLax, Senokot S    Family/ staff Communication: plan of care reviewed with the patient and charge nurse.   Labs/tests ordered:  none   Time spend 35 minutes.

## 2021-01-21 NOTE — Assessment & Plan Note (Signed)
Needs Zolpidem.

## 2021-01-21 NOTE — Assessment & Plan Note (Signed)
CAD, ASA, Fish oil, Coronary artery bypass grafting 1992

## 2021-01-21 NOTE — Assessment & Plan Note (Signed)
had work up in the past, venous US negative DVT in legs.  

## 2021-01-21 NOTE — Assessment & Plan Note (Signed)
no apparent focal weakness.

## 2021-01-21 NOTE — Assessment & Plan Note (Signed)
Pulmonary hypertension/edema BLE, takes Furosemide, [place] 11/30/19 negative DVT

## 2021-01-21 NOTE — Assessment & Plan Note (Signed)
Blood pressure is controlled, takes Telmisartan

## 2021-02-02 DIAGNOSIS — M6281 Muscle weakness (generalized): Secondary | ICD-10-CM | POA: Diagnosis not present

## 2021-02-02 DIAGNOSIS — R1312 Dysphagia, oropharyngeal phase: Secondary | ICD-10-CM | POA: Diagnosis not present

## 2021-02-02 DIAGNOSIS — K922 Gastrointestinal hemorrhage, unspecified: Secondary | ICD-10-CM | POA: Diagnosis not present

## 2021-02-02 DIAGNOSIS — R1310 Dysphagia, unspecified: Secondary | ICD-10-CM | POA: Diagnosis not present

## 2021-02-02 DIAGNOSIS — I639 Cerebral infarction, unspecified: Secondary | ICD-10-CM | POA: Diagnosis not present

## 2021-02-02 DIAGNOSIS — R41841 Cognitive communication deficit: Secondary | ICD-10-CM | POA: Diagnosis not present

## 2021-02-03 DIAGNOSIS — R1310 Dysphagia, unspecified: Secondary | ICD-10-CM | POA: Diagnosis not present

## 2021-02-03 DIAGNOSIS — M6281 Muscle weakness (generalized): Secondary | ICD-10-CM | POA: Diagnosis not present

## 2021-02-03 DIAGNOSIS — R41841 Cognitive communication deficit: Secondary | ICD-10-CM | POA: Diagnosis not present

## 2021-02-03 DIAGNOSIS — R1312 Dysphagia, oropharyngeal phase: Secondary | ICD-10-CM | POA: Diagnosis not present

## 2021-02-03 DIAGNOSIS — K922 Gastrointestinal hemorrhage, unspecified: Secondary | ICD-10-CM | POA: Diagnosis not present

## 2021-02-03 DIAGNOSIS — I639 Cerebral infarction, unspecified: Secondary | ICD-10-CM | POA: Diagnosis not present

## 2021-02-05 DIAGNOSIS — I639 Cerebral infarction, unspecified: Secondary | ICD-10-CM | POA: Diagnosis not present

## 2021-02-05 DIAGNOSIS — R1310 Dysphagia, unspecified: Secondary | ICD-10-CM | POA: Diagnosis not present

## 2021-02-05 DIAGNOSIS — M6281 Muscle weakness (generalized): Secondary | ICD-10-CM | POA: Diagnosis not present

## 2021-02-05 DIAGNOSIS — R41841 Cognitive communication deficit: Secondary | ICD-10-CM | POA: Diagnosis not present

## 2021-02-05 DIAGNOSIS — R1312 Dysphagia, oropharyngeal phase: Secondary | ICD-10-CM | POA: Diagnosis not present

## 2021-02-05 DIAGNOSIS — K922 Gastrointestinal hemorrhage, unspecified: Secondary | ICD-10-CM | POA: Diagnosis not present

## 2021-02-08 DIAGNOSIS — M6281 Muscle weakness (generalized): Secondary | ICD-10-CM | POA: Diagnosis not present

## 2021-02-08 DIAGNOSIS — K922 Gastrointestinal hemorrhage, unspecified: Secondary | ICD-10-CM | POA: Diagnosis not present

## 2021-02-08 DIAGNOSIS — R1312 Dysphagia, oropharyngeal phase: Secondary | ICD-10-CM | POA: Diagnosis not present

## 2021-02-08 DIAGNOSIS — R1310 Dysphagia, unspecified: Secondary | ICD-10-CM | POA: Diagnosis not present

## 2021-02-08 DIAGNOSIS — R41841 Cognitive communication deficit: Secondary | ICD-10-CM | POA: Diagnosis not present

## 2021-02-08 DIAGNOSIS — I639 Cerebral infarction, unspecified: Secondary | ICD-10-CM | POA: Diagnosis not present

## 2021-02-09 DIAGNOSIS — R1310 Dysphagia, unspecified: Secondary | ICD-10-CM | POA: Diagnosis not present

## 2021-02-09 DIAGNOSIS — M6281 Muscle weakness (generalized): Secondary | ICD-10-CM | POA: Diagnosis not present

## 2021-02-09 DIAGNOSIS — R41841 Cognitive communication deficit: Secondary | ICD-10-CM | POA: Diagnosis not present

## 2021-02-09 DIAGNOSIS — K922 Gastrointestinal hemorrhage, unspecified: Secondary | ICD-10-CM | POA: Diagnosis not present

## 2021-02-09 DIAGNOSIS — I639 Cerebral infarction, unspecified: Secondary | ICD-10-CM | POA: Diagnosis not present

## 2021-02-09 DIAGNOSIS — R1312 Dysphagia, oropharyngeal phase: Secondary | ICD-10-CM | POA: Diagnosis not present

## 2021-02-10 DIAGNOSIS — K922 Gastrointestinal hemorrhage, unspecified: Secondary | ICD-10-CM | POA: Diagnosis not present

## 2021-02-10 DIAGNOSIS — R1312 Dysphagia, oropharyngeal phase: Secondary | ICD-10-CM | POA: Diagnosis not present

## 2021-02-10 DIAGNOSIS — R41841 Cognitive communication deficit: Secondary | ICD-10-CM | POA: Diagnosis not present

## 2021-02-10 DIAGNOSIS — R1310 Dysphagia, unspecified: Secondary | ICD-10-CM | POA: Diagnosis not present

## 2021-02-10 DIAGNOSIS — M6281 Muscle weakness (generalized): Secondary | ICD-10-CM | POA: Diagnosis not present

## 2021-02-10 DIAGNOSIS — I639 Cerebral infarction, unspecified: Secondary | ICD-10-CM | POA: Diagnosis not present

## 2021-02-11 DIAGNOSIS — Z20822 Contact with and (suspected) exposure to covid-19: Secondary | ICD-10-CM | POA: Diagnosis not present

## 2021-02-12 DIAGNOSIS — Z9181 History of falling: Secondary | ICD-10-CM | POA: Diagnosis not present

## 2021-02-12 DIAGNOSIS — M6281 Muscle weakness (generalized): Secondary | ICD-10-CM | POA: Diagnosis not present

## 2021-02-12 DIAGNOSIS — N3946 Mixed incontinence: Secondary | ICD-10-CM | POA: Diagnosis not present

## 2021-02-12 DIAGNOSIS — R2681 Unsteadiness on feet: Secondary | ICD-10-CM | POA: Diagnosis not present

## 2021-02-12 DIAGNOSIS — I639 Cerebral infarction, unspecified: Secondary | ICD-10-CM | POA: Diagnosis not present

## 2021-02-12 DIAGNOSIS — I10 Essential (primary) hypertension: Secondary | ICD-10-CM | POA: Diagnosis not present

## 2021-02-12 DIAGNOSIS — R29898 Other symptoms and signs involving the musculoskeletal system: Secondary | ICD-10-CM | POA: Diagnosis not present

## 2021-02-13 DIAGNOSIS — M6281 Muscle weakness (generalized): Secondary | ICD-10-CM | POA: Diagnosis not present

## 2021-02-13 DIAGNOSIS — R2681 Unsteadiness on feet: Secondary | ICD-10-CM | POA: Diagnosis not present

## 2021-02-13 DIAGNOSIS — N3946 Mixed incontinence: Secondary | ICD-10-CM | POA: Diagnosis not present

## 2021-02-13 DIAGNOSIS — I10 Essential (primary) hypertension: Secondary | ICD-10-CM | POA: Diagnosis not present

## 2021-02-13 DIAGNOSIS — R29898 Other symptoms and signs involving the musculoskeletal system: Secondary | ICD-10-CM | POA: Diagnosis not present

## 2021-02-13 DIAGNOSIS — Z9181 History of falling: Secondary | ICD-10-CM | POA: Diagnosis not present

## 2021-02-15 DIAGNOSIS — R2681 Unsteadiness on feet: Secondary | ICD-10-CM | POA: Diagnosis not present

## 2021-02-15 DIAGNOSIS — M6281 Muscle weakness (generalized): Secondary | ICD-10-CM | POA: Diagnosis not present

## 2021-02-15 DIAGNOSIS — N3946 Mixed incontinence: Secondary | ICD-10-CM | POA: Diagnosis not present

## 2021-02-15 DIAGNOSIS — R29898 Other symptoms and signs involving the musculoskeletal system: Secondary | ICD-10-CM | POA: Diagnosis not present

## 2021-02-15 DIAGNOSIS — I10 Essential (primary) hypertension: Secondary | ICD-10-CM | POA: Diagnosis not present

## 2021-02-15 DIAGNOSIS — Z9181 History of falling: Secondary | ICD-10-CM | POA: Diagnosis not present

## 2021-02-17 DIAGNOSIS — R2681 Unsteadiness on feet: Secondary | ICD-10-CM | POA: Diagnosis not present

## 2021-02-17 DIAGNOSIS — I10 Essential (primary) hypertension: Secondary | ICD-10-CM | POA: Diagnosis not present

## 2021-02-17 DIAGNOSIS — M6281 Muscle weakness (generalized): Secondary | ICD-10-CM | POA: Diagnosis not present

## 2021-02-17 DIAGNOSIS — R29898 Other symptoms and signs involving the musculoskeletal system: Secondary | ICD-10-CM | POA: Diagnosis not present

## 2021-02-17 DIAGNOSIS — N3946 Mixed incontinence: Secondary | ICD-10-CM | POA: Diagnosis not present

## 2021-02-17 DIAGNOSIS — Z9181 History of falling: Secondary | ICD-10-CM | POA: Diagnosis not present

## 2021-02-18 ENCOUNTER — Encounter: Payer: Self-pay | Admitting: Nurse Practitioner

## 2021-02-18 ENCOUNTER — Non-Acute Institutional Stay (SKILLED_NURSING_FACILITY): Payer: Medicare Other | Admitting: Nurse Practitioner

## 2021-02-18 DIAGNOSIS — I6300 Cerebral infarction due to thrombosis of unspecified precerebral artery: Secondary | ICD-10-CM | POA: Diagnosis not present

## 2021-02-18 DIAGNOSIS — E039 Hypothyroidism, unspecified: Secondary | ICD-10-CM | POA: Diagnosis not present

## 2021-02-18 DIAGNOSIS — I480 Paroxysmal atrial fibrillation: Secondary | ICD-10-CM

## 2021-02-18 DIAGNOSIS — D6859 Other primary thrombophilia: Secondary | ICD-10-CM | POA: Diagnosis not present

## 2021-02-18 DIAGNOSIS — I10 Essential (primary) hypertension: Secondary | ICD-10-CM

## 2021-02-18 DIAGNOSIS — I270 Primary pulmonary hypertension: Secondary | ICD-10-CM

## 2021-02-18 DIAGNOSIS — R29898 Other symptoms and signs involving the musculoskeletal system: Secondary | ICD-10-CM | POA: Diagnosis not present

## 2021-02-18 DIAGNOSIS — G4700 Insomnia, unspecified: Secondary | ICD-10-CM

## 2021-02-18 DIAGNOSIS — Z8719 Personal history of other diseases of the digestive system: Secondary | ICD-10-CM | POA: Diagnosis not present

## 2021-02-18 DIAGNOSIS — Z9181 History of falling: Secondary | ICD-10-CM | POA: Diagnosis not present

## 2021-02-18 DIAGNOSIS — N183 Chronic kidney disease, stage 3 unspecified: Secondary | ICD-10-CM

## 2021-02-18 DIAGNOSIS — M6281 Muscle weakness (generalized): Secondary | ICD-10-CM | POA: Diagnosis not present

## 2021-02-18 DIAGNOSIS — E785 Hyperlipidemia, unspecified: Secondary | ICD-10-CM | POA: Diagnosis not present

## 2021-02-18 DIAGNOSIS — R350 Frequency of micturition: Secondary | ICD-10-CM

## 2021-02-18 DIAGNOSIS — R2681 Unsteadiness on feet: Secondary | ICD-10-CM | POA: Diagnosis not present

## 2021-02-18 DIAGNOSIS — K5901 Slow transit constipation: Secondary | ICD-10-CM

## 2021-02-18 DIAGNOSIS — I2510 Atherosclerotic heart disease of native coronary artery without angina pectoris: Secondary | ICD-10-CM | POA: Diagnosis not present

## 2021-02-18 DIAGNOSIS — N3946 Mixed incontinence: Secondary | ICD-10-CM | POA: Diagnosis not present

## 2021-02-18 NOTE — Assessment & Plan Note (Signed)
LDL 107 05/05/20

## 2021-02-18 NOTE — Assessment & Plan Note (Signed)
Stable, takes MiraLax, Senokot S 

## 2021-02-18 NOTE — Assessment & Plan Note (Signed)
No chest pain reported since last seen,  ASA, Fish oil, Coronary artery bypass grafting 1992

## 2021-02-18 NOTE — Assessment & Plan Note (Signed)
heart rate is in control. EKG 12/05/19 Afib at cardiology, only takes ASA, TSH 3.13 01/14/21

## 2021-02-18 NOTE — Assessment & Plan Note (Signed)
Blood pressure is controlled,  takes Telmisartan

## 2021-02-18 NOTE — Assessment & Plan Note (Signed)
Bun/creat 25/1.29 eGFR 50 01/15/21

## 2021-02-18 NOTE — Progress Notes (Signed)
Location:   [facility] Room Number: [phone] Place of Service:  SNF (31) Provider:  Breda Bond X Bahja Bence, NP  Malosi Hemstreet X, NP  Patient Care Team: Turkessa Ostrom X, NP as PCP - General (Internal Medicine) Belva Crome, MD as PCP - Cardiology (Cardiology)  Extended Emergency Contact Information Primary Emergency Contact: Carson Myrtle Address: [address]          [place], [place] Phone: [phone] Relation: Spouse Secondary Emergency Contact: Alan Ripper Address: [place], [place] [address] [place] Phone: [phone] Relation: Daughter  Code Status:  DNR Goals of care: Advanced Directive information Advanced Directives 02/18/2021  Does Patient Have a Medical Advance Directive? Yes  Type of Paramedic of [place];Living will  Does patient want to make changes to medical advance directive? No - Patient declined  Copy of [place] in Chart? Yes - validated most recent copy scanned in chart (See row information)  Would patient like information on creating a medical advance directive? -     Chief Complaint  Patient presents with   Medical Management of Chronic Issues    Routine follow up    Health Maintenance    Discuss need for PNA vaccine, td/tdap vaccine, shingles vaccine, and influenza vaccine.    HPI:  Pt is a 87 y.o. male seen today for medical management of chronic diseases.      Afib, heart rate is in control. EKG 12/05/19 Afib at cardiology, only takes ASA, TSH 3.13 01/14/21             Thrombophilia, had work up in the past, venous US negative DVT in legs.               Urinary frequency, no difference since off Mirabegran due to cost, had urinary retention, s/p ED eval 11/22/20, f/u urology             CAD, ASA, Fish oil, Coronary artery bypass grafting 1992             Hx of GI bleed, Hgb 13.6 01/14/21             CVA, no apparent focal  weakness.             HTN, takes Telmisartan Pulmonary hypertension/edema BLE, takes Furosemide, [place] 6/18/21negative DVT Insomnia, takes Zolpidem             Hypothyroidism, takes Levothyroxine, TSH 3.13 01/14/21             CKD Bun/creat 25/1.29 eGFR 50 01/15/21             Hyperlipidemia, LDL 107 05/05/20             Constipation, takes MiraLax, Senokot S  Past Medical History:  Diagnosis Date   Atrial fibrillation (HCC)    CAD (coronary artery disease)    Diverticulosis    Hypercholesteremia    Hypertension    Hypothyroidism    Insomnia    Murmur    Paroxysmal atrial fibrillation (HCC)    Urinary dysfunction    Past Surgical History:  Procedure Laterality Date   BYPASS GRAFT     HEMORRHOID SURGERY     HERNIA REPAIR     HIP SURGERY      Allergies  Allergen Reactions   Shellfish Allergy Swelling and Rash    Allergies as of 02/18/2021  Reactions   Shellfish Allergy Swelling, Rash        Medication List        Accurate as of February 18, 2021  4:40 PM. If you have any questions, ask your nurse or doctor.          aspirin 81 MG EC tablet in the morning and at bedtime.   cholecalciferol 1000 units tablet Commonly known as: VITAMIN D Take 1,000 Units by mouth daily.   furosemide 40 MG tablet Commonly known as: LASIX Take 40 mg by mouth daily.   levothyroxine 25 MCG tablet Commonly known as: SYNTHROID Take 25 mcg by mouth daily before breakfast.   Omega-3 1000 MG Caps Take by mouth in the morning and at bedtime.   polyethylene glycol 17 g packet Commonly known as: MIRALAX / GLYCOLAX Take 17 g by mouth daily.   PRESERVISION AREDS 2 PO Take by mouth daily.   CENTRUM SILVER PO Take by mouth daily.   saccharomyces boulardii 250 MG capsule Commonly known as: FLORASTOR Take 250 mg by mouth daily.   senna 8.6 MG tablet Commonly known as: SENOKOT Take 2 tablets by mouth at bedtime.   telmisartan 80 MG tablet Commonly known as: MICARDIS Take  80 mg by mouth daily. What changed: Another medication with the same name was removed. Continue taking this medication, and follow the directions you see here. Changed by: Ashish Rossetti X Adrien Dietzman, NP   vitamin C 1000 MG tablet Take 1,000 mg by mouth in the morning and at bedtime.   zolpidem 5 MG tablet Commonly known as: AMBIEN TAKE ONE TABLET AT BEDTIME AS NEEDED FOR SLEEP.        Review of Systems  Constitutional:  Negative for activity change, fever and unexpected weight change.  HENT:  Positive for hearing loss. Negative for congestion and voice change.   Respiratory:  Positive for shortness of breath. Negative for cough and wheezing.        Occasional nocturnal orthopnea   Cardiovascular:  Positive for leg swelling. Negative for chest pain and palpitations.  Gastrointestinal:  Negative for abdominal pain and constipation.  Genitourinary:  Positive for frequency. Negative for difficulty urinating and urgency.  Musculoskeletal:  Positive for arthralgias and gait problem.  Neurological:  Negative for speech difficulty, weakness and light-headedness.  Psychiatric/Behavioral:  Negative for sleep disturbance. The patient is not nervous/anxious.    Immunization History  Administered Date(s) Administered   Influenza, High Dose Seasonal PF 04/11/2017, 03/26/2020   Influenza-Unspecified 03/26/2020   Moderna Sars-Covid-2 Vaccination 06/18/2019, 07/16/2019   Pneumococcal-Unspecified 08/12/2012   Tetanus 06/14/2004   Zoster Recombinat (Shingrix) 06/02/2017   Pertinent  Health Maintenance Due  Topic Date Due   PNA vac Low Risk Adult (2 of 2 - PCV13) 08/12/2013   INFLUENZA VACCINE  01/12/2021   Fall Risk  09/18/2020 08/12/2020 05/16/2020  Falls in the past year? 0 0 0  Number falls in past yr: 0 0 0  Injury with Fall? 0 - -   Functional Status Survey:    Vitals:   02/18/21 1132  BP: 114/70  Pulse: 80  Resp: 18  Temp: (!) 97.5 F (36.4 C)  SpO2: 97%  Weight: 166 lb 9.6 oz (75.6 kg)   Height: _0  (1.626 m)   Body mass index is 28.6 kg/m. Physical Exam Vitals and nursing note reviewed.  Constitutional:      Appearance: Normal appearance.  HENT:     Head: Normocephalic and atraumatic.     Mouth/Throat:  Mouth: Mucous membranes are moist.  Eyes:     Extraocular Movements: Extraocular movements intact.     Conjunctiva/sclera: Conjunctivae normal.     Pupils: Pupils are equal, round, and reactive to light.  Cardiovascular:     Rate and Rhythm: Normal rate. Rhythm irregular.     Heart sounds: Murmur heard.  Pulmonary:     Effort: Pulmonary effort is normal.     Breath sounds: No rales.  Abdominal:     General: Bowel sounds are normal.     Palpations: Abdomen is soft.     Tenderness: There is no abdominal tenderness. There is no right CVA tenderness.  Musculoskeletal:     Cervical back: Normal range of motion and neck supple.     Right lower leg: Edema present.     Left lower leg: Edema present.     Comments: 1-2+  edema BLE L>R, uses TED sometimes.  Skin:    General: Skin is warm and dry.     Comments: A small SK injury right shoulder blade.   Neurological:     General: No focal deficit present.     Mental Status: He is alert and oriented to person, place, and time. Mental status is at baseline.     Motor: Weakness present.     Coordination: Coordination normal.     Gait: Gait abnormal.     Comments: Left sided weakness form previous CVA, muscle strength 5/5  Psychiatric:        Mood and Affect: Mood normal.        Behavior: Behavior normal.        Thought Content: Thought content normal.        Judgment: Judgment normal.    Labs reviewed: Recent Labs    05/05/20 0745 11/22/20 1129 12/26/20 0000 01/14/21 0843  NA 133* 130* 130* 134*  K 4.7 4.0 4.3 4.2  CL 99 99 97* 100  CO2 26 22 23* 27  GLUCOSE 100* 89  --  89  BUN 29* _0 CREATININE 1.40* 1.25* 1.3 1.29*  CALCIUM 9.1 8.5* 8.7 8.8   Recent Labs    05/05/20 0745  11/22/20 1129 12/26/20 0000 01/14/21 0843  AST _1 ALT _2 ALKPHOS  --  55 52  --   BILITOT 0.6 0.6  --  0.6  PROT 6.3 6.4*  --  6.2  ALBUMIN  --  3.3* 3.2*  --    Recent Labs    05/05/20 0745 12/26/20 0000 01/14/21 0843  WBC 5.0 9.8 5.9  NEUTROABS 2,725 6,831.00 2,921  HGB 14.4 13.8 13.6  HCT 41.9 40* 40.6  MCV 96.3  --  96.9  PLT 200 221 254   Lab Results  Component Value Date   TSH 3.13 01/14/2021   No results found for: HGBA1C Lab Results  Component Value Date   CHOL 183 05/05/2020   HDL 54 05/05/2020   LDLCALC 107 (H) 05/05/2020   TRIG 128 05/05/2020   CHOLHDL 3.4 05/05/2020    Significant Diagnostic Results in last 30 days:  No results found.  Assessment/Plan History of GI bleed Hgb 13.6 01/14/21  CVA (cerebral vascular accident) ([place]) no apparent focal weakness.  Hypertension Blood pressure is controlled,  takes Telmisartan  Pulmonary hypertension, primary ([place]) Pulmonary hypertension/edema BLE, takes Furosemide, [place] 6/18/21negative DVT  Insomnia Needs sleeping aid, takes Zolpidem  Hypothyroidism takes Levothyroxine, TSH 3.13 01/14/21  CKD (chronic kidney disease) stage 3, GFR  30-59 ml/min (HCC) Bun/creat 25/1.29 eGFR 50 01/15/21  Hyperlipidemia  LDL 107 05/05/20  Slow transit constipation Stable, takes MiraLax, Senokot S  CAD (coronary artery disease) No chest pain reported since last seen,  ASA, Fish oil, Coronary artery bypass grafting 1992  Urinary frequency Urinary frequency, no difference since off Mirabegran due to cost, had urinary retention, s/p ED eval 11/22/20, f/u urology  Thrombophilia ([place]) Thrombophilia, had work up in the past, venous US negative DVT in legs.   Paroxysmal atrial fibrillation  heart rate is in control. EKG 12/05/19 Afib at cardiology, only takes ASA, TSH 3.13 01/14/21    Family/ staff Communication: plan of care reviewed with the patient and charge nurse.   Labs/tests ordered:   none  Time spend 35 minutes.

## 2021-02-18 NOTE — Assessment & Plan Note (Signed)
takes Levothyroxine, TSH 3.13 01/14/21

## 2021-02-18 NOTE — Assessment & Plan Note (Signed)
no apparent focal weakness.

## 2021-02-18 NOTE — Assessment & Plan Note (Signed)
Needs sleeping aid, takes Zolpidem

## 2021-02-18 NOTE — Assessment & Plan Note (Signed)
Thrombophilia, had work up in the past, venous US negative DVT in legs.  

## 2021-02-18 NOTE — Assessment & Plan Note (Signed)
Pulmonary hypertension/edema BLE, takes Furosemide, [place] 6/18/21negative DVT

## 2021-02-18 NOTE — Assessment & Plan Note (Signed)
Urinary frequency, no difference since off Mirabegran due to cost, had urinary retention, s/p ED eval 11/22/20, f/u urology 

## 2021-02-18 NOTE — Assessment & Plan Note (Signed)
Hgb 13.6 01/14/21

## 2021-02-19 DIAGNOSIS — N3946 Mixed incontinence: Secondary | ICD-10-CM | POA: Diagnosis not present

## 2021-02-19 DIAGNOSIS — R2681 Unsteadiness on feet: Secondary | ICD-10-CM | POA: Diagnosis not present

## 2021-02-19 DIAGNOSIS — M6281 Muscle weakness (generalized): Secondary | ICD-10-CM | POA: Diagnosis not present

## 2021-02-19 DIAGNOSIS — R29898 Other symptoms and signs involving the musculoskeletal system: Secondary | ICD-10-CM | POA: Diagnosis not present

## 2021-02-19 DIAGNOSIS — I10 Essential (primary) hypertension: Secondary | ICD-10-CM | POA: Diagnosis not present

## 2021-02-19 DIAGNOSIS — Z9181 History of falling: Secondary | ICD-10-CM | POA: Diagnosis not present

## 2021-02-20 DIAGNOSIS — M6281 Muscle weakness (generalized): Secondary | ICD-10-CM | POA: Diagnosis not present

## 2021-02-20 DIAGNOSIS — R2681 Unsteadiness on feet: Secondary | ICD-10-CM | POA: Diagnosis not present

## 2021-02-20 DIAGNOSIS — R29898 Other symptoms and signs involving the musculoskeletal system: Secondary | ICD-10-CM | POA: Diagnosis not present

## 2021-02-20 DIAGNOSIS — Z9181 History of falling: Secondary | ICD-10-CM | POA: Diagnosis not present

## 2021-02-20 DIAGNOSIS — I10 Essential (primary) hypertension: Secondary | ICD-10-CM | POA: Diagnosis not present

## 2021-02-20 DIAGNOSIS — N3946 Mixed incontinence: Secondary | ICD-10-CM | POA: Diagnosis not present

## 2021-02-23 DIAGNOSIS — I10 Essential (primary) hypertension: Secondary | ICD-10-CM | POA: Diagnosis not present

## 2021-02-23 DIAGNOSIS — R29898 Other symptoms and signs involving the musculoskeletal system: Secondary | ICD-10-CM | POA: Diagnosis not present

## 2021-02-23 DIAGNOSIS — R2681 Unsteadiness on feet: Secondary | ICD-10-CM | POA: Diagnosis not present

## 2021-02-23 DIAGNOSIS — N3946 Mixed incontinence: Secondary | ICD-10-CM | POA: Diagnosis not present

## 2021-02-23 DIAGNOSIS — Z9181 History of falling: Secondary | ICD-10-CM | POA: Diagnosis not present

## 2021-02-23 DIAGNOSIS — M6281 Muscle weakness (generalized): Secondary | ICD-10-CM | POA: Diagnosis not present

## 2021-02-24 DIAGNOSIS — Z9181 History of falling: Secondary | ICD-10-CM | POA: Diagnosis not present

## 2021-02-24 DIAGNOSIS — M6281 Muscle weakness (generalized): Secondary | ICD-10-CM | POA: Diagnosis not present

## 2021-02-24 DIAGNOSIS — N3946 Mixed incontinence: Secondary | ICD-10-CM | POA: Diagnosis not present

## 2021-02-24 DIAGNOSIS — R2681 Unsteadiness on feet: Secondary | ICD-10-CM | POA: Diagnosis not present

## 2021-02-24 DIAGNOSIS — R29898 Other symptoms and signs involving the musculoskeletal system: Secondary | ICD-10-CM | POA: Diagnosis not present

## 2021-02-24 DIAGNOSIS — I10 Essential (primary) hypertension: Secondary | ICD-10-CM | POA: Diagnosis not present

## 2021-02-25 DIAGNOSIS — R2681 Unsteadiness on feet: Secondary | ICD-10-CM | POA: Diagnosis not present

## 2021-02-25 DIAGNOSIS — N3946 Mixed incontinence: Secondary | ICD-10-CM | POA: Diagnosis not present

## 2021-02-25 DIAGNOSIS — I10 Essential (primary) hypertension: Secondary | ICD-10-CM | POA: Diagnosis not present

## 2021-02-25 DIAGNOSIS — Z9181 History of falling: Secondary | ICD-10-CM | POA: Diagnosis not present

## 2021-02-25 DIAGNOSIS — R29898 Other symptoms and signs involving the musculoskeletal system: Secondary | ICD-10-CM | POA: Diagnosis not present

## 2021-02-25 DIAGNOSIS — M6281 Muscle weakness (generalized): Secondary | ICD-10-CM | POA: Diagnosis not present

## 2021-02-26 DIAGNOSIS — R2681 Unsteadiness on feet: Secondary | ICD-10-CM | POA: Diagnosis not present

## 2021-02-26 DIAGNOSIS — R29898 Other symptoms and signs involving the musculoskeletal system: Secondary | ICD-10-CM | POA: Diagnosis not present

## 2021-02-26 DIAGNOSIS — N3946 Mixed incontinence: Secondary | ICD-10-CM | POA: Diagnosis not present

## 2021-02-26 DIAGNOSIS — M6281 Muscle weakness (generalized): Secondary | ICD-10-CM | POA: Diagnosis not present

## 2021-02-26 DIAGNOSIS — Z9181 History of falling: Secondary | ICD-10-CM | POA: Diagnosis not present

## 2021-02-26 DIAGNOSIS — I10 Essential (primary) hypertension: Secondary | ICD-10-CM | POA: Diagnosis not present

## 2021-02-27 DIAGNOSIS — Z9181 History of falling: Secondary | ICD-10-CM | POA: Diagnosis not present

## 2021-02-27 DIAGNOSIS — M6281 Muscle weakness (generalized): Secondary | ICD-10-CM | POA: Diagnosis not present

## 2021-02-27 DIAGNOSIS — N3946 Mixed incontinence: Secondary | ICD-10-CM | POA: Diagnosis not present

## 2021-02-27 DIAGNOSIS — R2681 Unsteadiness on feet: Secondary | ICD-10-CM | POA: Diagnosis not present

## 2021-02-27 DIAGNOSIS — R29898 Other symptoms and signs involving the musculoskeletal system: Secondary | ICD-10-CM | POA: Diagnosis not present

## 2021-02-27 DIAGNOSIS — I10 Essential (primary) hypertension: Secondary | ICD-10-CM | POA: Diagnosis not present

## 2021-03-02 DIAGNOSIS — Z9181 History of falling: Secondary | ICD-10-CM | POA: Diagnosis not present

## 2021-03-02 DIAGNOSIS — M6281 Muscle weakness (generalized): Secondary | ICD-10-CM | POA: Diagnosis not present

## 2021-03-02 DIAGNOSIS — R2681 Unsteadiness on feet: Secondary | ICD-10-CM | POA: Diagnosis not present

## 2021-03-02 DIAGNOSIS — I10 Essential (primary) hypertension: Secondary | ICD-10-CM | POA: Diagnosis not present

## 2021-03-02 DIAGNOSIS — R29898 Other symptoms and signs involving the musculoskeletal system: Secondary | ICD-10-CM | POA: Diagnosis not present

## 2021-03-02 DIAGNOSIS — N3946 Mixed incontinence: Secondary | ICD-10-CM | POA: Diagnosis not present

## 2021-03-03 DIAGNOSIS — Z23 Encounter for immunization: Secondary | ICD-10-CM | POA: Diagnosis not present

## 2021-03-04 DIAGNOSIS — R2681 Unsteadiness on feet: Secondary | ICD-10-CM | POA: Diagnosis not present

## 2021-03-04 DIAGNOSIS — M6281 Muscle weakness (generalized): Secondary | ICD-10-CM | POA: Diagnosis not present

## 2021-03-04 DIAGNOSIS — I10 Essential (primary) hypertension: Secondary | ICD-10-CM | POA: Diagnosis not present

## 2021-03-04 DIAGNOSIS — Z9181 History of falling: Secondary | ICD-10-CM | POA: Diagnosis not present

## 2021-03-04 DIAGNOSIS — N3946 Mixed incontinence: Secondary | ICD-10-CM | POA: Diagnosis not present

## 2021-03-04 DIAGNOSIS — R29898 Other symptoms and signs involving the musculoskeletal system: Secondary | ICD-10-CM | POA: Diagnosis not present

## 2021-03-05 DIAGNOSIS — R2681 Unsteadiness on feet: Secondary | ICD-10-CM | POA: Diagnosis not present

## 2021-03-05 DIAGNOSIS — N3946 Mixed incontinence: Secondary | ICD-10-CM | POA: Diagnosis not present

## 2021-03-05 DIAGNOSIS — Z9181 History of falling: Secondary | ICD-10-CM | POA: Diagnosis not present

## 2021-03-05 DIAGNOSIS — R29898 Other symptoms and signs involving the musculoskeletal system: Secondary | ICD-10-CM | POA: Diagnosis not present

## 2021-03-05 DIAGNOSIS — I10 Essential (primary) hypertension: Secondary | ICD-10-CM | POA: Diagnosis not present

## 2021-03-05 DIAGNOSIS — M6281 Muscle weakness (generalized): Secondary | ICD-10-CM | POA: Diagnosis not present

## 2021-03-10 DIAGNOSIS — M6281 Muscle weakness (generalized): Secondary | ICD-10-CM | POA: Diagnosis not present

## 2021-03-10 DIAGNOSIS — R29898 Other symptoms and signs involving the musculoskeletal system: Secondary | ICD-10-CM | POA: Diagnosis not present

## 2021-03-10 DIAGNOSIS — I10 Essential (primary) hypertension: Secondary | ICD-10-CM | POA: Diagnosis not present

## 2021-03-10 DIAGNOSIS — R2681 Unsteadiness on feet: Secondary | ICD-10-CM | POA: Diagnosis not present

## 2021-03-10 DIAGNOSIS — Z9181 History of falling: Secondary | ICD-10-CM | POA: Diagnosis not present

## 2021-03-10 DIAGNOSIS — N3946 Mixed incontinence: Secondary | ICD-10-CM | POA: Diagnosis not present

## 2021-03-11 DIAGNOSIS — Z9181 History of falling: Secondary | ICD-10-CM | POA: Diagnosis not present

## 2021-03-11 DIAGNOSIS — R29898 Other symptoms and signs involving the musculoskeletal system: Secondary | ICD-10-CM | POA: Diagnosis not present

## 2021-03-11 DIAGNOSIS — I10 Essential (primary) hypertension: Secondary | ICD-10-CM | POA: Diagnosis not present

## 2021-03-11 DIAGNOSIS — R2681 Unsteadiness on feet: Secondary | ICD-10-CM | POA: Diagnosis not present

## 2021-03-11 DIAGNOSIS — N3946 Mixed incontinence: Secondary | ICD-10-CM | POA: Diagnosis not present

## 2021-03-11 DIAGNOSIS — M6281 Muscle weakness (generalized): Secondary | ICD-10-CM | POA: Diagnosis not present

## 2021-03-12 DIAGNOSIS — M6281 Muscle weakness (generalized): Secondary | ICD-10-CM | POA: Diagnosis not present

## 2021-03-12 DIAGNOSIS — Z9181 History of falling: Secondary | ICD-10-CM | POA: Diagnosis not present

## 2021-03-12 DIAGNOSIS — N3946 Mixed incontinence: Secondary | ICD-10-CM | POA: Diagnosis not present

## 2021-03-12 DIAGNOSIS — R2681 Unsteadiness on feet: Secondary | ICD-10-CM | POA: Diagnosis not present

## 2021-03-12 DIAGNOSIS — I10 Essential (primary) hypertension: Secondary | ICD-10-CM | POA: Diagnosis not present

## 2021-03-12 DIAGNOSIS — R29898 Other symptoms and signs involving the musculoskeletal system: Secondary | ICD-10-CM | POA: Diagnosis not present

## 2021-03-16 DIAGNOSIS — M6281 Muscle weakness (generalized): Secondary | ICD-10-CM | POA: Diagnosis not present

## 2021-03-16 DIAGNOSIS — I639 Cerebral infarction, unspecified: Secondary | ICD-10-CM | POA: Diagnosis not present

## 2021-03-16 DIAGNOSIS — R29898 Other symptoms and signs involving the musculoskeletal system: Secondary | ICD-10-CM | POA: Diagnosis not present

## 2021-03-16 DIAGNOSIS — I10 Essential (primary) hypertension: Secondary | ICD-10-CM | POA: Diagnosis not present

## 2021-03-16 DIAGNOSIS — Z9181 History of falling: Secondary | ICD-10-CM | POA: Diagnosis not present

## 2021-03-16 DIAGNOSIS — Z20822 Contact with and (suspected) exposure to covid-19: Secondary | ICD-10-CM | POA: Diagnosis not present

## 2021-03-16 DIAGNOSIS — R2681 Unsteadiness on feet: Secondary | ICD-10-CM | POA: Diagnosis not present

## 2021-03-16 DIAGNOSIS — N3946 Mixed incontinence: Secondary | ICD-10-CM | POA: Diagnosis not present

## 2021-03-17 ENCOUNTER — Non-Acute Institutional Stay (SKILLED_NURSING_FACILITY): Payer: Medicare Other | Admitting: Internal Medicine

## 2021-03-17 ENCOUNTER — Encounter: Payer: Self-pay | Admitting: Internal Medicine

## 2021-03-17 DIAGNOSIS — G4700 Insomnia, unspecified: Secondary | ICD-10-CM

## 2021-03-17 DIAGNOSIS — R2681 Unsteadiness on feet: Secondary | ICD-10-CM | POA: Diagnosis not present

## 2021-03-17 DIAGNOSIS — M6281 Muscle weakness (generalized): Secondary | ICD-10-CM | POA: Diagnosis not present

## 2021-03-17 DIAGNOSIS — I350 Nonrheumatic aortic (valve) stenosis: Secondary | ICD-10-CM

## 2021-03-17 DIAGNOSIS — R600 Localized edema: Secondary | ICD-10-CM | POA: Diagnosis not present

## 2021-03-17 DIAGNOSIS — N3946 Mixed incontinence: Secondary | ICD-10-CM | POA: Diagnosis not present

## 2021-03-17 DIAGNOSIS — Z9181 History of falling: Secondary | ICD-10-CM | POA: Diagnosis not present

## 2021-03-17 DIAGNOSIS — I6300 Cerebral infarction due to thrombosis of unspecified precerebral artery: Secondary | ICD-10-CM

## 2021-03-17 DIAGNOSIS — N183 Chronic kidney disease, stage 3 unspecified: Secondary | ICD-10-CM

## 2021-03-17 DIAGNOSIS — E039 Hypothyroidism, unspecified: Secondary | ICD-10-CM | POA: Diagnosis not present

## 2021-03-17 DIAGNOSIS — I480 Paroxysmal atrial fibrillation: Secondary | ICD-10-CM | POA: Diagnosis not present

## 2021-03-17 DIAGNOSIS — I10 Essential (primary) hypertension: Secondary | ICD-10-CM | POA: Diagnosis not present

## 2021-03-17 DIAGNOSIS — R29898 Other symptoms and signs involving the musculoskeletal system: Secondary | ICD-10-CM | POA: Diagnosis not present

## 2021-03-17 NOTE — Progress Notes (Signed)
Location:   [facility] Room Number: 64 Place of Service:  SNF [phone]) Provider:  Veleta Miners MD   Mast, Man X, NP  Patient Care Team: Mast, Man X, NP as PCP - General (Internal Medicine) Belva Crome, MD as PCP - Cardiology (Cardiology)  Extended Emergency Contact Information Primary Emergency Contact: Carson Myrtle Address: [address]          [place], [place] Phone: [phone] Relation: Spouse Secondary Emergency Contact: Alan Ripper Address: [place], [place] [address] [place] Phone: [phone] Relation: Daughter  Code Status:  DNR Goals of care: Advanced Directive information Advanced Directives 03/17/2021  Does Patient Have a Medical Advance Directive? Yes  Type of Paramedic of [place];Living will  Does patient want to make changes to medical advance directive? No - Patient declined  Copy of [place] in Chart? Yes - validated most recent copy scanned in chart (See row information)  Would patient like information on creating a medical advance directive? -     Chief Complaint  Patient presents with   Medical Management of Chronic Issues   Quality Metric Gaps    TDAP, Shingrix, #3 covid, flu shot    HPI:  Pt is a 87 y.o. male seen today for medical management of chronic diseases.   Readmit to  certified SNF bed  Has h/o PAF Continues to have issues with tachybradycardia. Has refused pacemaker. Not on any anticoagulation. Not on any rate slowing medicines follows with cardiology   Lower extremity edema due to diastolic HF On Low dose of Lasix and uses Ted hose Does not like high doses due to Urinary Frequency  Also has H/o Constipation, Hypothyroidism,h/o CVA CAD ,CKD Hypertension Insomnia and Urinary Frequency   Doing well in SNF Cognitively does well Able to do some transfers. Usually stay in his  wheelchair Weight is stable Appetite is good No Falls   Past Medical History:  Diagnosis Date   Atrial fibrillation ([place])    CAD (coronary artery disease)    Diverticulosis    Hypercholesteremia    Hypertension    Hypothyroidism    Insomnia    Murmur    Paroxysmal atrial fibrillation (HCC)    Urinary dysfunction    Past Surgical History:  Procedure Laterality Date   BYPASS GRAFT     HEMORRHOID SURGERY     HERNIA REPAIR     HIP SURGERY      Allergies  Allergen Reactions   Shellfish Allergy Swelling and Rash    Allergies as of 03/17/2021       Reactions   Shellfish Allergy Swelling, Rash        Medication List        Accurate as of March 17, 2021 11:38 AM. If you have any questions, ask your nurse or doctor.          aspirin 81 MG EC tablet in the morning and at bedtime.   cholecalciferol 1000 units tablet Commonly known as: VITAMIN D Take 1,000 Units by mouth daily.   furosemide 40 MG tablet Commonly known as: LASIX Take 40 mg by mouth daily.   levothyroxine 25 MCG tablet Commonly known as: SYNTHROID Take 25 mcg by mouth daily before breakfast.   Omega-3 1000 MG Caps Take by mouth in the morning and at bedtime.   polyethylene glycol 17 g packet Commonly  known as: MIRALAX / GLYCOLAX Take 17 g by mouth daily.   PRESERVISION AREDS 2 PO Take by mouth daily.   CENTRUM SILVER PO Take by mouth daily.   saccharomyces boulardii 250 MG capsule Commonly known as: FLORASTOR Take 250 mg by mouth daily.   senna 8.6 MG tablet Commonly known as: SENOKOT Take 2 tablets by mouth at bedtime.   telmisartan 80 MG tablet Commonly known as: MICARDIS Take 80 mg by mouth daily.   vitamin C 1000 MG tablet Take 1,000 mg by mouth in the morning and at bedtime.   zolpidem 5 MG tablet Commonly known as: AMBIEN TAKE ONE TABLET AT BEDTIME AS NEEDED FOR SLEEP.        Review of Systems  Constitutional:  Positive for activity change.  HENT: Negative.     Respiratory: Negative.    Cardiovascular:  Positive for leg swelling.  Gastrointestinal:  Positive for constipation and diarrhea.  Genitourinary:  Positive for frequency.  Musculoskeletal:  Positive for gait problem.  Skin: Negative.   Neurological:  Positive for weakness.  Psychiatric/Behavioral: Negative.     Immunization History  Administered Date(s) Administered   Influenza, High Dose Seasonal PF 04/11/2017, 03/26/2020   Influenza-Unspecified 03/26/2020   Moderna Sars-Covid-2 Vaccination 06/18/2019, 07/16/2019   Pneumococcal-Unspecified 08/12/2012   Tetanus 06/14/2004   Zoster Recombinat (Shingrix) 06/02/2017   Pertinent  Health Maintenance Due  Topic Date Due   INFLUENZA VACCINE  01/12/2021   Fall Risk  09/18/2020 08/12/2020 05/16/2020  Falls in the past year? 0 0 0  Number falls in past yr: 0 0 0  Injury with Fall? 0 - -   Functional Status Survey:    Vitals:   03/17/21 1134  BP: 114/66  Pulse: 80  Resp: 18  Temp: 97.8 F (36.6 C)  SpO2: 97%  Weight: 169 lb 8 oz (76.9 kg)  Height: 5\' 4"  (1.626 m)   Body mass index is 29.09 kg/m. Physical Exam Vitals reviewed.  Constitutional:      Appearance: Normal appearance.  HENT:     Head: Normocephalic.     Nose: Nose normal.     Mouth/Throat:     Mouth: Mucous membranes are moist.     Pharynx: Oropharynx is clear.  Eyes:     Pupils: Pupils are equal, round, and reactive to light.  Cardiovascular:     Rate and Rhythm: Normal rate and regular rhythm.     Pulses: Normal pulses.  Pulmonary:     Effort: Pulmonary effort is normal.     Comments: Has rales Bilateral which are his chronic Abdominal:     General: Abdomen is flat. Bowel sounds are normal.     Palpations: Abdomen is soft.  Musculoskeletal:        General: Swelling present.     Cervical back: Neck supple.  Skin:    General: Skin is warm.  Neurological:     General: No focal deficit present.     Mental Status: He is alert and oriented to person,  place, and time.  Psychiatric:        Mood and Affect: Mood normal.        Thought Content: Thought content normal.    Labs reviewed: Recent Labs    05/05/20 0745 11/22/20 1129 12/26/20 0000 01/14/21 0843  NA 133* 130* 130* 134*  K 4.7 4.0 4.3 4.2  CL 99 99 97* 100  CO2 26 22 23* 27  GLUCOSE 100* 89  --  89  BUN 29*  20 20 25   CREATININE 1.40* 1.25* 1.3 1.29*  CALCIUM 9.1 8.5* 8.7 8.8   Recent Labs    05/05/20 0745 11/22/20 1129 12/26/20 0000 01/14/21 0843  AST 15 18 15 16   ALT 12 9 11 11   ALKPHOS  --  55 52  --   BILITOT 0.6 0.6  --  0.6  PROT 6.3 6.4*  --  6.2  ALBUMIN  --  3.3* 3.2*  --    Recent Labs    05/05/20 0745 12/26/20 0000 01/14/21 0843  WBC 5.0 9.8 5.9  NEUTROABS 2,725 6,831.00 2,921  HGB 14.4 13.8 13.6  HCT 41.9 40* 40.6  MCV 96.3  --  96.9  PLT 200 221 254   Lab Results  Component Value Date   TSH 3.13 01/14/2021   No results found for: HGBA1C Lab Results  Component Value Date   CHOL 183 05/05/2020   HDL 54 05/05/2020   LDLCALC 107 (H) 05/05/2020   TRIG 128 05/05/2020   CHOLHDL 3.4 05/05/2020    Significant Diagnostic Results in last 30 days:  No results found.  Assessment/Plan Paroxysmal atrial fibrillation (HCC) Has Tachy brady . Refused PPM Sometimes HR goes down to 40. Stays Asymptomatic Not on any Anticoagulation due to age and falls Bilateral leg edema On Lasix Creat stable Insomnia, unspecified type Continue Ambien PRN Hypothyroidism/, unspecified type TSH normal in 8/22 Stage 3b chronic kidney disease,  Creat stable on Lasix Primary hypertension On Micardis  CAD On aspirin Off Statin now Non rheumatic aortic valve stenosis Constipation Wants to make Senna PRN Family/ staff Communication:   Labs/tests ordered:

## 2021-03-18 DIAGNOSIS — M6281 Muscle weakness (generalized): Secondary | ICD-10-CM | POA: Diagnosis not present

## 2021-03-18 DIAGNOSIS — R29898 Other symptoms and signs involving the musculoskeletal system: Secondary | ICD-10-CM | POA: Diagnosis not present

## 2021-03-18 DIAGNOSIS — N3946 Mixed incontinence: Secondary | ICD-10-CM | POA: Diagnosis not present

## 2021-03-18 DIAGNOSIS — I10 Essential (primary) hypertension: Secondary | ICD-10-CM | POA: Diagnosis not present

## 2021-03-18 DIAGNOSIS — Z9181 History of falling: Secondary | ICD-10-CM | POA: Diagnosis not present

## 2021-03-18 DIAGNOSIS — R2681 Unsteadiness on feet: Secondary | ICD-10-CM | POA: Diagnosis not present

## 2021-03-19 DIAGNOSIS — R29898 Other symptoms and signs involving the musculoskeletal system: Secondary | ICD-10-CM | POA: Diagnosis not present

## 2021-03-19 DIAGNOSIS — N3946 Mixed incontinence: Secondary | ICD-10-CM | POA: Diagnosis not present

## 2021-03-19 DIAGNOSIS — M6281 Muscle weakness (generalized): Secondary | ICD-10-CM | POA: Diagnosis not present

## 2021-03-19 DIAGNOSIS — R2681 Unsteadiness on feet: Secondary | ICD-10-CM | POA: Diagnosis not present

## 2021-03-19 DIAGNOSIS — Z9181 History of falling: Secondary | ICD-10-CM | POA: Diagnosis not present

## 2021-03-19 DIAGNOSIS — I10 Essential (primary) hypertension: Secondary | ICD-10-CM | POA: Diagnosis not present

## 2021-03-20 DIAGNOSIS — R29898 Other symptoms and signs involving the musculoskeletal system: Secondary | ICD-10-CM | POA: Diagnosis not present

## 2021-03-20 DIAGNOSIS — R2681 Unsteadiness on feet: Secondary | ICD-10-CM | POA: Diagnosis not present

## 2021-03-20 DIAGNOSIS — I10 Essential (primary) hypertension: Secondary | ICD-10-CM | POA: Diagnosis not present

## 2021-03-20 DIAGNOSIS — N3946 Mixed incontinence: Secondary | ICD-10-CM | POA: Diagnosis not present

## 2021-03-20 DIAGNOSIS — M6281 Muscle weakness (generalized): Secondary | ICD-10-CM | POA: Diagnosis not present

## 2021-03-20 DIAGNOSIS — Z9181 History of falling: Secondary | ICD-10-CM | POA: Diagnosis not present

## 2021-03-20 LAB — BASIC METABOLIC PANEL
BUN: 17 (ref 4–21)
CO2: 27 — AB (ref 13–22)
Chloride: 99 (ref 99–108)
Creatinine: 1.3 (ref 0.6–1.3)
Glucose: 97
Potassium: 4.5 (ref 3.4–5.3)
Sodium: 135 — AB (ref 137–147)

## 2021-03-20 LAB — COMPREHENSIVE METABOLIC PANEL: Calcium: 9.1 (ref 8.7–10.7)

## 2021-03-23 DIAGNOSIS — R2681 Unsteadiness on feet: Secondary | ICD-10-CM | POA: Diagnosis not present

## 2021-03-23 DIAGNOSIS — Z9181 History of falling: Secondary | ICD-10-CM | POA: Diagnosis not present

## 2021-03-23 DIAGNOSIS — R29898 Other symptoms and signs involving the musculoskeletal system: Secondary | ICD-10-CM | POA: Diagnosis not present

## 2021-03-23 DIAGNOSIS — M6281 Muscle weakness (generalized): Secondary | ICD-10-CM | POA: Diagnosis not present

## 2021-03-23 DIAGNOSIS — N3946 Mixed incontinence: Secondary | ICD-10-CM | POA: Diagnosis not present

## 2021-03-23 DIAGNOSIS — I10 Essential (primary) hypertension: Secondary | ICD-10-CM | POA: Diagnosis not present

## 2021-03-24 DIAGNOSIS — R2681 Unsteadiness on feet: Secondary | ICD-10-CM | POA: Diagnosis not present

## 2021-03-24 DIAGNOSIS — M6281 Muscle weakness (generalized): Secondary | ICD-10-CM | POA: Diagnosis not present

## 2021-03-24 DIAGNOSIS — R29898 Other symptoms and signs involving the musculoskeletal system: Secondary | ICD-10-CM | POA: Diagnosis not present

## 2021-03-24 DIAGNOSIS — Z9181 History of falling: Secondary | ICD-10-CM | POA: Diagnosis not present

## 2021-03-24 DIAGNOSIS — N3946 Mixed incontinence: Secondary | ICD-10-CM | POA: Diagnosis not present

## 2021-03-24 DIAGNOSIS — I10 Essential (primary) hypertension: Secondary | ICD-10-CM | POA: Diagnosis not present

## 2021-03-25 DIAGNOSIS — I10 Essential (primary) hypertension: Secondary | ICD-10-CM | POA: Diagnosis not present

## 2021-03-25 DIAGNOSIS — Z9181 History of falling: Secondary | ICD-10-CM | POA: Diagnosis not present

## 2021-03-25 DIAGNOSIS — R2681 Unsteadiness on feet: Secondary | ICD-10-CM | POA: Diagnosis not present

## 2021-03-25 DIAGNOSIS — M6281 Muscle weakness (generalized): Secondary | ICD-10-CM | POA: Diagnosis not present

## 2021-03-25 DIAGNOSIS — N3946 Mixed incontinence: Secondary | ICD-10-CM | POA: Diagnosis not present

## 2021-03-25 DIAGNOSIS — R29898 Other symptoms and signs involving the musculoskeletal system: Secondary | ICD-10-CM | POA: Diagnosis not present

## 2021-03-26 DIAGNOSIS — I10 Essential (primary) hypertension: Secondary | ICD-10-CM | POA: Diagnosis not present

## 2021-03-26 DIAGNOSIS — R2681 Unsteadiness on feet: Secondary | ICD-10-CM | POA: Diagnosis not present

## 2021-03-26 DIAGNOSIS — N3946 Mixed incontinence: Secondary | ICD-10-CM | POA: Diagnosis not present

## 2021-03-26 DIAGNOSIS — M6281 Muscle weakness (generalized): Secondary | ICD-10-CM | POA: Diagnosis not present

## 2021-03-26 DIAGNOSIS — Z9181 History of falling: Secondary | ICD-10-CM | POA: Diagnosis not present

## 2021-03-26 DIAGNOSIS — R29898 Other symptoms and signs involving the musculoskeletal system: Secondary | ICD-10-CM | POA: Diagnosis not present

## 2021-03-30 DIAGNOSIS — I10 Essential (primary) hypertension: Secondary | ICD-10-CM | POA: Diagnosis not present

## 2021-03-30 DIAGNOSIS — R29898 Other symptoms and signs involving the musculoskeletal system: Secondary | ICD-10-CM | POA: Diagnosis not present

## 2021-03-30 DIAGNOSIS — R2681 Unsteadiness on feet: Secondary | ICD-10-CM | POA: Diagnosis not present

## 2021-03-30 DIAGNOSIS — N3946 Mixed incontinence: Secondary | ICD-10-CM | POA: Diagnosis not present

## 2021-03-30 DIAGNOSIS — M6281 Muscle weakness (generalized): Secondary | ICD-10-CM | POA: Diagnosis not present

## 2021-03-30 DIAGNOSIS — Z9181 History of falling: Secondary | ICD-10-CM | POA: Diagnosis not present

## 2021-03-31 DIAGNOSIS — I10 Essential (primary) hypertension: Secondary | ICD-10-CM | POA: Diagnosis not present

## 2021-03-31 DIAGNOSIS — Z9181 History of falling: Secondary | ICD-10-CM | POA: Diagnosis not present

## 2021-03-31 DIAGNOSIS — R2681 Unsteadiness on feet: Secondary | ICD-10-CM | POA: Diagnosis not present

## 2021-03-31 DIAGNOSIS — R29898 Other symptoms and signs involving the musculoskeletal system: Secondary | ICD-10-CM | POA: Diagnosis not present

## 2021-03-31 DIAGNOSIS — M6281 Muscle weakness (generalized): Secondary | ICD-10-CM | POA: Diagnosis not present

## 2021-03-31 DIAGNOSIS — N3946 Mixed incontinence: Secondary | ICD-10-CM | POA: Diagnosis not present

## 2021-04-01 DIAGNOSIS — M6281 Muscle weakness (generalized): Secondary | ICD-10-CM | POA: Diagnosis not present

## 2021-04-01 DIAGNOSIS — R2681 Unsteadiness on feet: Secondary | ICD-10-CM | POA: Diagnosis not present

## 2021-04-01 DIAGNOSIS — Z9181 History of falling: Secondary | ICD-10-CM | POA: Diagnosis not present

## 2021-04-01 DIAGNOSIS — I10 Essential (primary) hypertension: Secondary | ICD-10-CM | POA: Diagnosis not present

## 2021-04-01 DIAGNOSIS — R29898 Other symptoms and signs involving the musculoskeletal system: Secondary | ICD-10-CM | POA: Diagnosis not present

## 2021-04-01 DIAGNOSIS — N3946 Mixed incontinence: Secondary | ICD-10-CM | POA: Diagnosis not present

## 2021-04-02 DIAGNOSIS — I10 Essential (primary) hypertension: Secondary | ICD-10-CM | POA: Diagnosis not present

## 2021-04-02 DIAGNOSIS — M6281 Muscle weakness (generalized): Secondary | ICD-10-CM | POA: Diagnosis not present

## 2021-04-02 DIAGNOSIS — N3946 Mixed incontinence: Secondary | ICD-10-CM | POA: Diagnosis not present

## 2021-04-02 DIAGNOSIS — R29898 Other symptoms and signs involving the musculoskeletal system: Secondary | ICD-10-CM | POA: Diagnosis not present

## 2021-04-02 DIAGNOSIS — R2681 Unsteadiness on feet: Secondary | ICD-10-CM | POA: Diagnosis not present

## 2021-04-02 DIAGNOSIS — Z9181 History of falling: Secondary | ICD-10-CM | POA: Diagnosis not present

## 2021-04-03 ENCOUNTER — Encounter: Payer: Self-pay | Admitting: Nurse Practitioner

## 2021-04-03 ENCOUNTER — Non-Acute Institutional Stay (SKILLED_NURSING_FACILITY): Payer: Medicare Other | Admitting: Nurse Practitioner

## 2021-04-03 DIAGNOSIS — I480 Paroxysmal atrial fibrillation: Secondary | ICD-10-CM

## 2021-04-03 DIAGNOSIS — K5901 Slow transit constipation: Secondary | ICD-10-CM

## 2021-04-03 DIAGNOSIS — N183 Chronic kidney disease, stage 3 unspecified: Secondary | ICD-10-CM | POA: Diagnosis not present

## 2021-04-03 DIAGNOSIS — I10 Essential (primary) hypertension: Secondary | ICD-10-CM | POA: Diagnosis not present

## 2021-04-03 DIAGNOSIS — R21 Rash and other nonspecific skin eruption: Secondary | ICD-10-CM

## 2021-04-03 DIAGNOSIS — R2681 Unsteadiness on feet: Secondary | ICD-10-CM | POA: Diagnosis not present

## 2021-04-03 DIAGNOSIS — I2510 Atherosclerotic heart disease of native coronary artery without angina pectoris: Secondary | ICD-10-CM

## 2021-04-03 DIAGNOSIS — D6859 Other primary thrombophilia: Secondary | ICD-10-CM | POA: Diagnosis not present

## 2021-04-03 DIAGNOSIS — E039 Hypothyroidism, unspecified: Secondary | ICD-10-CM | POA: Diagnosis not present

## 2021-04-03 DIAGNOSIS — Z8719 Personal history of other diseases of the digestive system: Secondary | ICD-10-CM | POA: Diagnosis not present

## 2021-04-03 DIAGNOSIS — Z9181 History of falling: Secondary | ICD-10-CM | POA: Diagnosis not present

## 2021-04-03 DIAGNOSIS — I6300 Cerebral infarction due to thrombosis of unspecified precerebral artery: Secondary | ICD-10-CM

## 2021-04-03 DIAGNOSIS — N3946 Mixed incontinence: Secondary | ICD-10-CM | POA: Diagnosis not present

## 2021-04-03 DIAGNOSIS — I270 Primary pulmonary hypertension: Secondary | ICD-10-CM | POA: Diagnosis not present

## 2021-04-03 DIAGNOSIS — G4700 Insomnia, unspecified: Secondary | ICD-10-CM | POA: Diagnosis not present

## 2021-04-03 DIAGNOSIS — R350 Frequency of micturition: Secondary | ICD-10-CM

## 2021-04-03 DIAGNOSIS — M6281 Muscle weakness (generalized): Secondary | ICD-10-CM | POA: Diagnosis not present

## 2021-04-03 DIAGNOSIS — R29898 Other symptoms and signs involving the musculoskeletal system: Secondary | ICD-10-CM | POA: Diagnosis not present

## 2021-04-03 NOTE — Assessment & Plan Note (Signed)
takes Levothyroxine, TSH 3.13 01/14/21

## 2021-04-03 NOTE — Assessment & Plan Note (Signed)
Bun/creat 25/1.29 eGFR 50 01/15/21

## 2021-04-03 NOTE — Assessment & Plan Note (Signed)
no difference since off Mirabegran due to cost, had urinary retention, s/p ED eval 11/22/20, f/u urology 

## 2021-04-03 NOTE — Assessment & Plan Note (Signed)
no apparent focal weakness.

## 2021-04-03 NOTE — Assessment & Plan Note (Signed)
Pulmonary hypertension/edema BLE, takes Furosemide, [place] 6/18/21negative DVT

## 2021-04-03 NOTE — Assessment & Plan Note (Signed)
tachy brady ,refused PPM, heart rate is in control. EKG 12/05/19 Afib at cardiology, only takes ASA, TSH 3.13 01/14/21.

## 2021-04-03 NOTE — Assessment & Plan Note (Signed)
takes Zolpidem.  ?

## 2021-04-03 NOTE — Progress Notes (Signed)
Location:   SNF [facility] Room Number: 32 Place of Service:  SNF (31) Provider: Lennie Odor Pheobe Sandiford NP  Rocky Rishel X, NP  Patient Care Team: Romond Pipkins X, NP as PCP - General (Internal Medicine) Belva Crome, MD as PCP - Cardiology (Cardiology)  Extended Emergency Contact Information Primary Emergency Contact: Carson Myrtle Address: [address]          [place], [place] Phone: [phone] Relation: Spouse Secondary Emergency Contact: Alan Ripper Address: [place], [place] [address] [place] Phone: [phone] Relation: Daughter  Code Status: DNR Goals of care: Advanced Directive information Advanced Directives 03/17/2021  Does Patient Have a Medical Advance Directive? Yes  Type of Paramedic of [place];Living will  Does patient want to make changes to medical advance directive? No - Patient declined  Copy of [place] in Chart? Yes - validated most recent copy scanned in chart (See row information)  Would patient like information on creating a medical advance directive? -     Chief Complaint  Patient presents with   Acute Visit    Itching rash right back and coccyx    HPI:  Pt is a 87 y.o. male seen today for an acute visit for itching rash right lower back, coccyx region, not certain of onset and duration.     Afib, tachy brady ,refused PPM, heart rate is in control. EKG 12/05/19 Afib at cardiology, only takes ASA, TSH 3.13 01/14/21.             Thrombophilia, had work up in the past, venous US negative DVT in legs.               Urinary frequency, no difference since off Mirabegran due to cost, had urinary retention, s/p ED eval 11/22/20, f/u urology             CAD, ASA, Fish oil, Coronary artery bypass grafting 1992             Hx of GI bleed, Hgb 13.6 01/14/21             CVA, no apparent focal weakness.             HTN, takes Telmisartan Pulmonary  hypertension/edema BLE, takes Furosemide, [place] 6/18/21negative DVT Insomnia, takes Zolpidem             Hypothyroidism, takes Levothyroxine, TSH 3.13 01/14/21             CKD Bun/creat 25/1.29 eGFR 50 01/15/21             Hyperlipidemia, LDL 107 05/05/20             Constipation, takes MiraLax, Senokot S  Past Medical History:  Diagnosis Date   Atrial fibrillation (HCC)    CAD (coronary artery disease)    Diverticulosis    Hypercholesteremia    Hypertension    Hypothyroidism    Insomnia    Murmur    Paroxysmal atrial fibrillation (HCC)    Urinary dysfunction    Past Surgical History:  Procedure Laterality Date   BYPASS GRAFT     HEMORRHOID SURGERY     HERNIA REPAIR     HIP SURGERY      Allergies  Allergen Reactions   Shellfish Allergy Swelling and Rash    Allergies as of 04/03/2021       Reactions  Shellfish Allergy Swelling, Rash        Medication List        Accurate as of April 03, 2021  2:39 PM. If you have any questions, ask your nurse or doctor.          aspirin 81 MG EC tablet in the morning and at bedtime.   cholecalciferol 1000 units tablet Commonly known as: VITAMIN D Take 1,000 Units by mouth daily.   furosemide 40 MG tablet Commonly known as: LASIX Take 40 mg by mouth daily.   levothyroxine 25 MCG tablet Commonly known as: SYNTHROID Take 25 mcg by mouth daily before breakfast.   Omega-3 1000 MG Caps Take by mouth in the morning and at bedtime.   polyethylene glycol 17 g packet Commonly known as: MIRALAX / GLYCOLAX Take 17 g by mouth daily.   PRESERVISION AREDS 2 PO Take by mouth daily.   CENTRUM SILVER PO Take by mouth daily.   saccharomyces boulardii 250 MG capsule Commonly known as: FLORASTOR Take 250 mg by mouth daily.   senna 8.6 MG tablet Commonly known as: SENOKOT Take 2 tablets by mouth daily as needed.   telmisartan 80 MG tablet Commonly known as: MICARDIS Take 80 mg by mouth daily.   vitamin C 1000 MG  tablet Take 1,000 mg by mouth in the morning and at bedtime.   zolpidem 5 MG tablet Commonly known as: AMBIEN TAKE ONE TABLET AT BEDTIME AS NEEDED FOR SLEEP.        Review of Systems  Constitutional:  Negative for activity change, appetite change and fever.  HENT:  Positive for hearing loss. Negative for congestion and voice change.   Respiratory:  Positive for shortness of breath. Negative for cough and wheezing.        Occasional nocturnal orthopnea   Cardiovascular:  Positive for leg swelling. Negative for chest pain and palpitations.  Gastrointestinal:  Negative for abdominal pain and constipation.  Genitourinary:  Positive for frequency. Negative for difficulty urinating and urgency.  Musculoskeletal:  Positive for arthralgias and gait problem.  Skin:  Positive for rash.  Neurological:  Negative for speech difficulty, weakness and light-headedness.  Psychiatric/Behavioral:  Negative for confusion and sleep disturbance. The patient is not nervous/anxious.    Immunization History  Administered Date(s) Administered   Influenza, High Dose Seasonal PF 04/11/2017, 03/26/2020   Influenza-Unspecified 03/26/2020   Moderna Sars-Covid-2 Vaccination 06/18/2019, 07/16/2019   Pneumococcal-Unspecified 08/12/2012   Tetanus 06/14/2004   Zoster Recombinat (Shingrix) 06/02/2017   Pertinent  Health Maintenance Due  Topic Date Due   INFLUENZA VACCINE  01/12/2021   Fall Risk  09/18/2020 08/12/2020 05/16/2020  Falls in the past year? 0 0 0  Number falls in past yr: 0 0 0  Injury with Fall? 0 - -   Functional Status Survey:    Vitals:   04/03/21 1416  BP: 130/68  Pulse: 70  Resp: 16  Temp: (!) 97.5 F (36.4 C)  SpO2: 95%   There is no height or weight on file to calculate BMI. Physical Exam Vitals and nursing note reviewed.  Constitutional:      Appearance: Normal appearance.  HENT:     Head: Normocephalic and atraumatic.     Mouth/Throat:     Mouth: Mucous membranes are moist.   Eyes:     Extraocular Movements: Extraocular movements intact.     Conjunctiva/sclera: Conjunctivae normal.     Pupils: Pupils are equal, round, and reactive to light.  Cardiovascular:  Rate and Rhythm: Normal rate. Rhythm irregular.     Heart sounds: Murmur heard.  Pulmonary:     Effort: Pulmonary effort is normal.     Breath sounds: Rales present.     Comments: Bibasilar rales.  Abdominal:     General: Bowel sounds are normal.     Palpations: Abdomen is soft.     Tenderness: There is no abdominal tenderness. There is no right CVA tenderness.  Musculoskeletal:     Cervical back: Normal range of motion and neck supple.     Right lower leg: Edema present.     Left lower leg: Edema present.     Comments: 2+ edema BLE L>R, uses TED sometimes.  Skin:    General: Skin is warm and dry.     Findings: Rash present.     Comments: Excoriated redness R+L buttocks, sacrum, coccyx itching, redness.   Neurological:     General: No focal deficit present.     Mental Status: He is alert and oriented to person, place, and time. Mental status is at baseline.     Motor: Weakness present.     Coordination: Coordination normal.     Gait: Gait abnormal.     Comments: Left sided weakness form previous CVA, muscle strength 5/5  Psychiatric:        Mood and Affect: Mood normal.        Behavior: Behavior normal.        Thought Content: Thought content normal.        Judgment: Judgment normal.    Labs reviewed: Recent Labs    05/05/20 0745 11/22/20 1129 12/26/20 0000 01/14/21 0843  NA 133* 130* 130* 134*  K 4.7 4.0 4.3 4.2  CL 99 99 97* 100  CO2 26 22 23* 27  GLUCOSE 100* 89  --  89  BUN 29* '20 20 25  ' CREATININE 1.40* 1.25* 1.3 1.29*  CALCIUM 9.1 8.5* 8.7 8.8   Recent Labs    05/05/20 0745 11/22/20 1129 12/26/20 0000 01/14/21 0843  AST '15 18 15 16  ' ALT '12 9 11 11  ' ALKPHOS  --  55 52  --   BILITOT 0.6 0.6  --  0.6  PROT 6.3 6.4*  --  6.2  ALBUMIN  --  3.3* 3.2*  --     Recent Labs    05/05/20 0745 12/26/20 0000 01/14/21 0843  WBC 5.0 9.8 5.9  NEUTROABS 2,725 6,831.00 2,921  HGB 14.4 13.8 13.6  HCT 41.9 40* 40.6  MCV 96.3  --  96.9  PLT 200 221 254   Lab Results  Component Value Date   TSH 3.13 01/14/2021   No results found for: HGBA1C Lab Results  Component Value Date   CHOL 183 05/05/2020   HDL 54 05/05/2020   LDLCALC 107 (H) 05/05/2020   TRIG 128 05/05/2020   CHOLHDL 3.4 05/05/2020    Significant Diagnostic Results in last 30 days:  No results found.  Assessment/Plan: Rash Excoriated redness R+L buttocks, sacrum,  coccyx, heat/moist are contributory, apply Nystatin cream bid, 1% Hydrocortisone cream daily x 10 days.   Paroxysmal atrial fibrillation tachy brady ,refused PPM, heart rate is in control. EKG 12/05/19 Afib at cardiology, only takes ASA, TSH 3.13 01/14/21.  Thrombophilia ([place])  had work up in the past, venous US negative DVT in legs.   Urinary frequency  no difference since off Mirabegran due to cost, had urinary retention, s/p ED eval 11/22/20, f/u urology  CAD (coronary artery  disease) ASA, Fish oil, Coronary artery bypass grafting 1992  History of GI bleed Hgb 13.6 01/14/21  CVA (cerebral vascular accident) ([place]) no apparent focal weakness.  Hypertension Blood pressure is controlled, takes Telmisartan  Pulmonary hypertension, primary ([place]) Pulmonary hypertension/edema BLE, takes Furosemide, [place] 6/18/21negative DVT  Insomnia takes Zolpidem  Hypothyroidism  takes Levothyroxine, TSH 3.13 01/14/21  CKD (chronic kidney disease) stage 3, GFR 30-59 ml/min (HCC) Bun/creat 25/1.29 eGFR 50 01/15/21  Slow transit constipation takes MiraLax, Senokot S    Family/ staff Communication: plan of care reviewed with the patient and charge nurse.   Labs/tests ordered:  none  Time spend 35 minutes.

## 2021-04-03 NOTE — Assessment & Plan Note (Signed)
had work up in the past, venous US negative DVT in legs.  

## 2021-04-03 NOTE — Assessment & Plan Note (Signed)
ASA, Fish oil, Coronary artery bypass grafting 1992

## 2021-04-03 NOTE — Assessment & Plan Note (Signed)
Hgb 13.6 01/14/21

## 2021-04-03 NOTE — Assessment & Plan Note (Signed)
takes MiraLax, Senokot S 

## 2021-04-03 NOTE — Assessment & Plan Note (Signed)
Blood pressure is controlled, takes Telmisartan

## 2021-04-03 NOTE — Assessment & Plan Note (Addendum)
Excoriated redness R+L buttocks, sacrum,  coccyx, heat/moist are contributory, apply Nystatin cream bid, 1% Hydrocortisone cream daily x 10 days.

## 2021-04-06 DIAGNOSIS — R29898 Other symptoms and signs involving the musculoskeletal system: Secondary | ICD-10-CM | POA: Diagnosis not present

## 2021-04-06 DIAGNOSIS — M6281 Muscle weakness (generalized): Secondary | ICD-10-CM | POA: Diagnosis not present

## 2021-04-06 DIAGNOSIS — I10 Essential (primary) hypertension: Secondary | ICD-10-CM | POA: Diagnosis not present

## 2021-04-06 DIAGNOSIS — R2681 Unsteadiness on feet: Secondary | ICD-10-CM | POA: Diagnosis not present

## 2021-04-06 DIAGNOSIS — N3946 Mixed incontinence: Secondary | ICD-10-CM | POA: Diagnosis not present

## 2021-04-06 DIAGNOSIS — Z9181 History of falling: Secondary | ICD-10-CM | POA: Diagnosis not present

## 2021-04-08 DIAGNOSIS — R2681 Unsteadiness on feet: Secondary | ICD-10-CM | POA: Diagnosis not present

## 2021-04-08 DIAGNOSIS — I10 Essential (primary) hypertension: Secondary | ICD-10-CM | POA: Diagnosis not present

## 2021-04-08 DIAGNOSIS — N3946 Mixed incontinence: Secondary | ICD-10-CM | POA: Diagnosis not present

## 2021-04-08 DIAGNOSIS — R29898 Other symptoms and signs involving the musculoskeletal system: Secondary | ICD-10-CM | POA: Diagnosis not present

## 2021-04-08 DIAGNOSIS — Z9181 History of falling: Secondary | ICD-10-CM | POA: Diagnosis not present

## 2021-04-08 DIAGNOSIS — M6281 Muscle weakness (generalized): Secondary | ICD-10-CM | POA: Diagnosis not present

## 2021-04-10 DIAGNOSIS — R2681 Unsteadiness on feet: Secondary | ICD-10-CM | POA: Diagnosis not present

## 2021-04-10 DIAGNOSIS — Z9181 History of falling: Secondary | ICD-10-CM | POA: Diagnosis not present

## 2021-04-10 DIAGNOSIS — R29898 Other symptoms and signs involving the musculoskeletal system: Secondary | ICD-10-CM | POA: Diagnosis not present

## 2021-04-10 DIAGNOSIS — N3946 Mixed incontinence: Secondary | ICD-10-CM | POA: Diagnosis not present

## 2021-04-10 DIAGNOSIS — I10 Essential (primary) hypertension: Secondary | ICD-10-CM | POA: Diagnosis not present

## 2021-04-10 DIAGNOSIS — M6281 Muscle weakness (generalized): Secondary | ICD-10-CM | POA: Diagnosis not present

## 2021-04-13 DIAGNOSIS — R29898 Other symptoms and signs involving the musculoskeletal system: Secondary | ICD-10-CM | POA: Diagnosis not present

## 2021-04-13 DIAGNOSIS — Z9181 History of falling: Secondary | ICD-10-CM | POA: Diagnosis not present

## 2021-04-13 DIAGNOSIS — M6281 Muscle weakness (generalized): Secondary | ICD-10-CM | POA: Diagnosis not present

## 2021-04-13 DIAGNOSIS — N3946 Mixed incontinence: Secondary | ICD-10-CM | POA: Diagnosis not present

## 2021-04-13 DIAGNOSIS — I10 Essential (primary) hypertension: Secondary | ICD-10-CM | POA: Diagnosis not present

## 2021-04-13 DIAGNOSIS — R2681 Unsteadiness on feet: Secondary | ICD-10-CM | POA: Diagnosis not present

## 2021-04-14 DIAGNOSIS — R2681 Unsteadiness on feet: Secondary | ICD-10-CM | POA: Diagnosis not present

## 2021-04-14 DIAGNOSIS — M6281 Muscle weakness (generalized): Secondary | ICD-10-CM | POA: Diagnosis not present

## 2021-04-14 DIAGNOSIS — N3946 Mixed incontinence: Secondary | ICD-10-CM | POA: Diagnosis not present

## 2021-04-14 DIAGNOSIS — R29898 Other symptoms and signs involving the musculoskeletal system: Secondary | ICD-10-CM | POA: Diagnosis not present

## 2021-04-14 DIAGNOSIS — H90A31 Mixed conductive and sensorineural hearing loss, unilateral, right ear with restricted hearing on the contralateral side: Secondary | ICD-10-CM | POA: Diagnosis not present

## 2021-04-14 DIAGNOSIS — Z9181 History of falling: Secondary | ICD-10-CM | POA: Diagnosis not present

## 2021-04-14 DIAGNOSIS — I639 Cerebral infarction, unspecified: Secondary | ICD-10-CM | POA: Diagnosis not present

## 2021-04-14 DIAGNOSIS — I10 Essential (primary) hypertension: Secondary | ICD-10-CM | POA: Diagnosis not present

## 2021-04-14 DIAGNOSIS — H90A22 Sensorineural hearing loss, unilateral, left ear, with restricted hearing on the contralateral side: Secondary | ICD-10-CM | POA: Diagnosis not present

## 2021-04-15 ENCOUNTER — Telehealth: Payer: Self-pay | Admitting: Nurse Practitioner

## 2021-04-15 DIAGNOSIS — Z9181 History of falling: Secondary | ICD-10-CM | POA: Diagnosis not present

## 2021-04-15 DIAGNOSIS — R29898 Other symptoms and signs involving the musculoskeletal system: Secondary | ICD-10-CM | POA: Diagnosis not present

## 2021-04-15 DIAGNOSIS — N3946 Mixed incontinence: Secondary | ICD-10-CM | POA: Diagnosis not present

## 2021-04-15 DIAGNOSIS — R2681 Unsteadiness on feet: Secondary | ICD-10-CM | POA: Diagnosis not present

## 2021-04-15 DIAGNOSIS — M6281 Muscle weakness (generalized): Secondary | ICD-10-CM | POA: Diagnosis not present

## 2021-04-15 DIAGNOSIS — I10 Essential (primary) hypertension: Secondary | ICD-10-CM | POA: Diagnosis not present

## 2021-04-15 NOTE — Telephone Encounter (Signed)
Pt was seen fro hearing aid check with [facility] on 04/14/21.  We need referral to cover that visit please faxed to [facility] f [phone]  Thanks, Vilinda Blanks

## 2021-04-16 ENCOUNTER — Non-Acute Institutional Stay (SKILLED_NURSING_FACILITY): Payer: Medicare Other | Admitting: Nurse Practitioner

## 2021-04-16 DIAGNOSIS — I480 Paroxysmal atrial fibrillation: Secondary | ICD-10-CM | POA: Diagnosis not present

## 2021-04-16 DIAGNOSIS — Z8719 Personal history of other diseases of the digestive system: Secondary | ICD-10-CM

## 2021-04-16 DIAGNOSIS — I270 Primary pulmonary hypertension: Secondary | ICD-10-CM | POA: Diagnosis not present

## 2021-04-16 DIAGNOSIS — D6859 Other primary thrombophilia: Secondary | ICD-10-CM

## 2021-04-16 DIAGNOSIS — E039 Hypothyroidism, unspecified: Secondary | ICD-10-CM | POA: Diagnosis not present

## 2021-04-16 DIAGNOSIS — G4700 Insomnia, unspecified: Secondary | ICD-10-CM

## 2021-04-16 DIAGNOSIS — I6300 Cerebral infarction due to thrombosis of unspecified precerebral artery: Secondary | ICD-10-CM | POA: Diagnosis not present

## 2021-04-16 DIAGNOSIS — N183 Chronic kidney disease, stage 3 unspecified: Secondary | ICD-10-CM | POA: Diagnosis not present

## 2021-04-16 DIAGNOSIS — R350 Frequency of micturition: Secondary | ICD-10-CM

## 2021-04-16 DIAGNOSIS — E785 Hyperlipidemia, unspecified: Secondary | ICD-10-CM

## 2021-04-16 DIAGNOSIS — I10 Essential (primary) hypertension: Secondary | ICD-10-CM

## 2021-04-16 DIAGNOSIS — R21 Rash and other nonspecific skin eruption: Secondary | ICD-10-CM

## 2021-04-16 DIAGNOSIS — I2510 Atherosclerotic heart disease of native coronary artery without angina pectoris: Secondary | ICD-10-CM

## 2021-04-16 DIAGNOSIS — K5901 Slow transit constipation: Secondary | ICD-10-CM

## 2021-04-16 NOTE — Assessment & Plan Note (Signed)
takes Levothyroxine, TSH 3.13 01/14/21

## 2021-04-16 NOTE — Assessment & Plan Note (Signed)
tachy brady ,refused PPM, heart rate is in control. EKG 12/05/19 Afib at cardiology, only takes ASA, TSH 3.13 01/14/21.

## 2021-04-16 NOTE — Assessment & Plan Note (Addendum)
Blood pressure runs low, reduced Telmisartan 40mg  qd 04/21/21, will observe.

## 2021-04-16 NOTE — Assessment & Plan Note (Signed)
Improved

## 2021-04-16 NOTE — Assessment & Plan Note (Signed)
Bun/creat 25/1.29 eGFR 50 01/15/21

## 2021-04-16 NOTE — Assessment & Plan Note (Signed)
Chronic, takes Zolpidem

## 2021-04-16 NOTE — Assessment & Plan Note (Signed)
Pulmonary hypertension/edema BLE, takes Furosemide, [place] 6/18/21negative DVT

## 2021-04-16 NOTE — Progress Notes (Addendum)
Location:   [facility] Room Number: 58 Place of Service:  SNF (31) Provider: Lennie Odor Rayland Hamed NP  Karizma Cheek X, NP  Patient Care Team: Loman Logan X, NP as PCP - General (Internal Medicine) Belva Crome, MD as PCP - Cardiology (Cardiology)  Extended Emergency Contact Information Primary Emergency Contact: Carson Myrtle Address: [address]          [place], [place] Phone: [phone] Relation: Spouse Secondary Emergency Contact: Alan Ripper Address: [place], [place] [address] [place] Phone: [phone] Relation: Daughter  Code Status:  DNR Goals of care: Advanced Directive information Advanced Directives 03/17/2021  Does Patient Have a Medical Advance Directive? Yes  Type of Paramedic of [place];Living will  Does patient want to make changes to medical advance directive? No - Patient declined  Copy of [place] in Chart? Yes - validated most recent copy scanned in chart (See row information)  Would patient like information on creating a medical advance directive? -     Chief Complaint  Patient presents with   Medical Management of Chronic Issues     HPI:  Pt is a 87 y.o. male seen today for medical management of chronic diseases.       Dermatitis, itching rash right lower back, coccyx region, improved on 10 day course of Nystatin, Hydrocortisone cream since 04/03/21             Afib, tachy brady ,refused PPM, heart rate is in control. EKG 12/05/19 Afib at cardiology, only takes ASA, TSH 3.13 01/14/21.             Thrombophilia, had work up in the past, venous US negative DVT in legs.               Urinary frequency, no difference since off Mirabegran due to cost, had urinary retention, s/p ED eval 11/22/20, f/u urology             CAD, ASA, Fish oil, Coronary artery bypass grafting 1992             Hx of GI bleed, Hgb 13.6 01/14/21             CVA, no  apparent focal weakness.             HTN, takes Telmisartan Pulmonary hypertension/edema BLE, takes Furosemide, [place] 6/18/21negative DVT Insomnia, takes Zolpidem             Hypothyroidism, takes Levothyroxine, TSH 3.13 01/14/21             CKD Bun/creat 25/1.29 eGFR 50 01/15/21             Hyperlipidemia, LDL 107 05/05/20             Constipation, takes MiraLax, Senokot S  Past Medical History:  Diagnosis Date   Atrial fibrillation (HCC)    CAD (coronary artery disease)    Diverticulosis    Hypercholesteremia    Hypertension    Hypothyroidism    Insomnia    Murmur    Paroxysmal atrial fibrillation (HCC)    Urinary dysfunction    Past Surgical History:  Procedure Laterality Date   BYPASS GRAFT     HEMORRHOID SURGERY     HERNIA REPAIR     HIP SURGERY      Allergies  Allergen Reactions   Shellfish Allergy Swelling and  Rash    Allergies as of 04/16/2021       Reactions   Shellfish Allergy Swelling, Rash        Medication List        Accurate as of April 16, 2021 11:59 PM. If you have any questions, ask your nurse or doctor.          aspirin 81 MG EC tablet in the morning and at bedtime.   cholecalciferol 1000 units tablet Commonly known as: VITAMIN D Take 1,000 Units by mouth daily.   furosemide 40 MG tablet Commonly known as: LASIX Take 40 mg by mouth daily.   levothyroxine 25 MCG tablet Commonly known as: SYNTHROID Take 25 mcg by mouth daily before breakfast.   Omega-3 1000 MG Caps Take by mouth in the morning and at bedtime.   polyethylene glycol 17 g packet Commonly known as: MIRALAX / GLYCOLAX Take 17 g by mouth daily.   PRESERVISION AREDS 2 PO Take by mouth daily.   CENTRUM SILVER PO Take by mouth daily.   saccharomyces boulardii 250 MG capsule Commonly known as: FLORASTOR Take 250 mg by mouth daily.   senna 8.6 MG tablet Commonly known as: SENOKOT Take 2 tablets by mouth daily as needed.   telmisartan 80 MG tablet Commonly known  as: MICARDIS Take 80 mg by mouth daily.   vitamin C 1000 MG tablet Take 1,000 mg by mouth in the morning and at bedtime.   zolpidem 5 MG tablet Commonly known as: AMBIEN TAKE ONE TABLET AT BEDTIME AS NEEDED FOR SLEEP.        Review of Systems  Constitutional:  Negative for fatigue, fever and unexpected weight change.  HENT:  Positive for hearing loss. Negative for congestion and voice change.   Respiratory:  Positive for shortness of breath. Negative for cough and wheezing.        Occasional nocturnal orthopnea   Cardiovascular:  Positive for leg swelling. Negative for chest pain and palpitations.  Gastrointestinal:  Negative for abdominal pain and constipation.  Genitourinary:  Positive for frequency. Negative for difficulty urinating and urgency.  Musculoskeletal:  Positive for arthralgias and gait problem.  Neurological:  Negative for speech difficulty, weakness and light-headedness.  Psychiatric/Behavioral:  Negative for confusion and sleep disturbance. The patient is not nervous/anxious.    Immunization History  Administered Date(s) Administered   Influenza, High Dose Seasonal PF 04/11/2017, 03/26/2020   Influenza-Unspecified 03/26/2020   Moderna Sars-Covid-2 Vaccination 06/18/2019, 07/16/2019   Pneumococcal-Unspecified 08/12/2012   Tetanus 06/14/2004   Zoster Recombinat (Shingrix) 06/02/2017   Pertinent  Health Maintenance Due  Topic Date Due   INFLUENZA VACCINE  01/12/2021   Fall Risk 05/16/2020 08/12/2020 09/18/2020 11/22/2020  Falls in the past year? 0 0 0 -  Was there an injury with Fall? - - 0 -  Fall Risk Category Calculator - - 0 -  Fall Risk Category - - Low -  Patient Fall Risk Level - - Low fall risk Low fall risk   Functional Status Survey:    Vitals:   04/17/21 1115  BP: (!) 118/52  Pulse: 81  Resp: 18  Temp: (!) 97.3 F (36.3 C)  SpO2: 96%   There is no height or weight on file to calculate BMI. Physical Exam Vitals and nursing note reviewed.   Constitutional:      Appearance: Normal appearance.  HENT:     Head: Normocephalic and atraumatic.     Mouth/Throat:     Mouth: Mucous membranes are  moist.  Eyes:     Extraocular Movements: Extraocular movements intact.     Conjunctiva/sclera: Conjunctivae normal.     Pupils: Pupils are equal, round, and reactive to light.  Cardiovascular:     Rate and Rhythm: Normal rate. Rhythm irregular.     Heart sounds: Murmur heard.  Pulmonary:     Effort: Pulmonary effort is normal.     Breath sounds: Rales present.     Comments: Bibasilar rales.  Abdominal:     General: Bowel sounds are normal.     Palpations: Abdomen is soft.     Tenderness: There is no abdominal tenderness.  Musculoskeletal:     Cervical back: Normal range of motion and neck supple.     Right lower leg: Edema present.     Left lower leg: Edema present.     Comments: 2+ edema BLE L>R, uses TED sometimes.  Skin:    General: Skin is warm and dry.     Comments: Excoriated redness R+L buttocks, sacrum, coccyx healing nicely, residual of dark pigmentation, no itching.   Neurological:     General: No focal deficit present.     Mental Status: He is alert and oriented to person, place, and time. Mental status is at baseline.     Motor: Weakness present.     Coordination: Coordination normal.     Gait: Gait abnormal.     Comments: Left sided weakness form previous CVA, muscle strength 5/5  Psychiatric:        Mood and Affect: Mood normal.        Behavior: Behavior normal.        Thought Content: Thought content normal.        Judgment: Judgment normal.    Labs reviewed: Recent Labs    05/05/20 0745 11/22/20 1129 12/26/20 0000 01/14/21 0843  NA 133* 130* 130* 134*  K 4.7 4.0 4.3 4.2  CL 99 99 97* 100  CO2 26 22 23* 27  GLUCOSE 100* 89  --  89  BUN 29* '20 20 25  ' CREATININE 1.40* 1.25* 1.3 1.29*  CALCIUM 9.1 8.5* 8.7 8.8   Recent Labs    05/05/20 0745 11/22/20 1129 12/26/20 0000 01/14/21 0843  AST '15 18  15 16  ' ALT '12 9 11 11  ' ALKPHOS  --  55 52  --   BILITOT 0.6 0.6  --  0.6  PROT 6.3 6.4*  --  6.2  ALBUMIN  --  3.3* 3.2*  --    Recent Labs    05/05/20 0745 12/26/20 0000 01/14/21 0843  WBC 5.0 9.8 5.9  NEUTROABS 2,725 6,831.00 2,921  HGB 14.4 13.8 13.6  HCT 41.9 40* 40.6  MCV 96.3  --  96.9  PLT 200 221 254   Lab Results  Component Value Date   TSH 3.13 01/14/2021   No results found for: HGBA1C Lab Results  Component Value Date   CHOL 183 05/05/2020   HDL 54 05/05/2020   LDLCALC 107 (H) 05/05/2020   TRIG 128 05/05/2020   CHOLHDL 3.4 05/05/2020    Significant Diagnostic Results in last 30 days:  No results found.  Assessment/Plan  Paroxysmal atrial fibrillation tachy brady ,refused PPM, heart rate is in control. EKG 12/05/19 Afib at cardiology, only takes ASA, TSH 3.13 01/14/21.  Thrombophilia ([place]) , had work up in the past, venous US negative DVT in legs.   Urinary frequency no difference since off Mirabegran due to cost, had urinary retention, s/p ED eval  11/22/20, f/u urology  CAD (coronary artery disease) ASA, Fish oil, Coronary artery bypass grafting 1992  Rash Improved.   History of GI bleed  Hx of GI bleed, Hgb 13.6 01/14/21  CVA (cerebral vascular accident) ([place]) no apparent focal weakness.  Hypertension Blood pressure runs low, reduced Telmisartan 16m qd 04/21/21, will observe.   Pulmonary hypertension, primary ([place] Pulmonary hypertension/edema BLE, takes Furosemide, [place] DVT  Insomnia Chronic, takes Zolpidem  Hypothyroidism  takes Levothyroxine, TSH 3.13 01/14/21  CKD (chronic kidney disease) stage 3, GFR 30-59 ml/min (HCC) Bun/creat 25/1.29 eGFR 50 01/15/21  Hyperlipidemia LDL 107 05/05/20  Slow transit constipation Stable, takes MiraLax, Senokot S   Family/ staff Communication: plan of care reviewed with the patient and charge nurse.   Labs/tests ordered:  none  Time spend 35 minutes.

## 2021-04-16 NOTE — Assessment & Plan Note (Signed)
,   had work up in the past, venous US negative DVT in legs.  

## 2021-04-16 NOTE — Assessment & Plan Note (Signed)
Hx of GI bleed, Hgb 13.6 01/14/21

## 2021-04-16 NOTE — Assessment & Plan Note (Signed)
no difference since off Mirabegran due to cost, had urinary retention, s/p ED eval 11/22/20, f/u urology 

## 2021-04-16 NOTE — Assessment & Plan Note (Signed)
ASA, Fish oil, Coronary artery bypass grafting 1992

## 2021-04-16 NOTE — Assessment & Plan Note (Signed)
Stable, takes MiraLax, Senokot S 

## 2021-04-16 NOTE — Assessment & Plan Note (Signed)
LDL 107 05/05/20

## 2021-04-16 NOTE — Assessment & Plan Note (Signed)
no apparent focal weakness.

## 2021-04-17 DIAGNOSIS — M6281 Muscle weakness (generalized): Secondary | ICD-10-CM | POA: Diagnosis not present

## 2021-04-17 DIAGNOSIS — Z9181 History of falling: Secondary | ICD-10-CM | POA: Diagnosis not present

## 2021-04-17 DIAGNOSIS — R29898 Other symptoms and signs involving the musculoskeletal system: Secondary | ICD-10-CM | POA: Diagnosis not present

## 2021-04-17 DIAGNOSIS — N3946 Mixed incontinence: Secondary | ICD-10-CM | POA: Diagnosis not present

## 2021-04-17 DIAGNOSIS — R2681 Unsteadiness on feet: Secondary | ICD-10-CM | POA: Diagnosis not present

## 2021-04-17 DIAGNOSIS — I10 Essential (primary) hypertension: Secondary | ICD-10-CM | POA: Diagnosis not present

## 2021-04-20 DIAGNOSIS — I10 Essential (primary) hypertension: Secondary | ICD-10-CM | POA: Diagnosis not present

## 2021-04-20 DIAGNOSIS — N3946 Mixed incontinence: Secondary | ICD-10-CM | POA: Diagnosis not present

## 2021-04-20 DIAGNOSIS — R29898 Other symptoms and signs involving the musculoskeletal system: Secondary | ICD-10-CM | POA: Diagnosis not present

## 2021-04-20 DIAGNOSIS — M6281 Muscle weakness (generalized): Secondary | ICD-10-CM | POA: Diagnosis not present

## 2021-04-20 DIAGNOSIS — R2681 Unsteadiness on feet: Secondary | ICD-10-CM | POA: Diagnosis not present

## 2021-04-20 DIAGNOSIS — Z9181 History of falling: Secondary | ICD-10-CM | POA: Diagnosis not present

## 2021-04-21 ENCOUNTER — Ambulatory Visit: Payer: Medicare Other | Admitting: Interventional Cardiology

## 2021-04-21 DIAGNOSIS — Z9181 History of falling: Secondary | ICD-10-CM | POA: Diagnosis not present

## 2021-04-21 DIAGNOSIS — N3946 Mixed incontinence: Secondary | ICD-10-CM | POA: Diagnosis not present

## 2021-04-21 DIAGNOSIS — R29898 Other symptoms and signs involving the musculoskeletal system: Secondary | ICD-10-CM | POA: Diagnosis not present

## 2021-04-21 DIAGNOSIS — M6281 Muscle weakness (generalized): Secondary | ICD-10-CM | POA: Diagnosis not present

## 2021-04-21 DIAGNOSIS — I10 Essential (primary) hypertension: Secondary | ICD-10-CM | POA: Diagnosis not present

## 2021-04-21 DIAGNOSIS — R2681 Unsteadiness on feet: Secondary | ICD-10-CM | POA: Diagnosis not present

## 2021-04-22 DIAGNOSIS — N3946 Mixed incontinence: Secondary | ICD-10-CM | POA: Diagnosis not present

## 2021-04-22 DIAGNOSIS — Z9181 History of falling: Secondary | ICD-10-CM | POA: Diagnosis not present

## 2021-04-22 DIAGNOSIS — I10 Essential (primary) hypertension: Secondary | ICD-10-CM | POA: Diagnosis not present

## 2021-04-22 DIAGNOSIS — R29898 Other symptoms and signs involving the musculoskeletal system: Secondary | ICD-10-CM | POA: Diagnosis not present

## 2021-04-22 DIAGNOSIS — M6281 Muscle weakness (generalized): Secondary | ICD-10-CM | POA: Diagnosis not present

## 2021-04-22 DIAGNOSIS — R2681 Unsteadiness on feet: Secondary | ICD-10-CM | POA: Diagnosis not present

## 2021-04-23 DIAGNOSIS — R2681 Unsteadiness on feet: Secondary | ICD-10-CM | POA: Diagnosis not present

## 2021-04-23 DIAGNOSIS — N3946 Mixed incontinence: Secondary | ICD-10-CM | POA: Diagnosis not present

## 2021-04-23 DIAGNOSIS — Z9181 History of falling: Secondary | ICD-10-CM | POA: Diagnosis not present

## 2021-04-23 DIAGNOSIS — R29898 Other symptoms and signs involving the musculoskeletal system: Secondary | ICD-10-CM | POA: Diagnosis not present

## 2021-04-23 DIAGNOSIS — I10 Essential (primary) hypertension: Secondary | ICD-10-CM | POA: Diagnosis not present

## 2021-04-23 DIAGNOSIS — M6281 Muscle weakness (generalized): Secondary | ICD-10-CM | POA: Diagnosis not present

## 2021-04-24 ENCOUNTER — Encounter: Payer: Self-pay | Admitting: Nurse Practitioner

## 2021-04-24 DIAGNOSIS — N3946 Mixed incontinence: Secondary | ICD-10-CM | POA: Diagnosis not present

## 2021-04-24 DIAGNOSIS — M6281 Muscle weakness (generalized): Secondary | ICD-10-CM | POA: Diagnosis not present

## 2021-04-24 DIAGNOSIS — I10 Essential (primary) hypertension: Secondary | ICD-10-CM | POA: Diagnosis not present

## 2021-04-24 DIAGNOSIS — Z9181 History of falling: Secondary | ICD-10-CM | POA: Diagnosis not present

## 2021-04-24 DIAGNOSIS — R29898 Other symptoms and signs involving the musculoskeletal system: Secondary | ICD-10-CM | POA: Diagnosis not present

## 2021-04-24 DIAGNOSIS — R2681 Unsteadiness on feet: Secondary | ICD-10-CM | POA: Diagnosis not present

## 2021-04-27 DIAGNOSIS — R2681 Unsteadiness on feet: Secondary | ICD-10-CM | POA: Diagnosis not present

## 2021-04-27 DIAGNOSIS — M6281 Muscle weakness (generalized): Secondary | ICD-10-CM | POA: Diagnosis not present

## 2021-04-27 DIAGNOSIS — N3946 Mixed incontinence: Secondary | ICD-10-CM | POA: Diagnosis not present

## 2021-04-27 DIAGNOSIS — I10 Essential (primary) hypertension: Secondary | ICD-10-CM | POA: Diagnosis not present

## 2021-04-27 DIAGNOSIS — R29898 Other symptoms and signs involving the musculoskeletal system: Secondary | ICD-10-CM | POA: Diagnosis not present

## 2021-04-27 DIAGNOSIS — Z9181 History of falling: Secondary | ICD-10-CM | POA: Diagnosis not present

## 2021-04-28 DIAGNOSIS — I10 Essential (primary) hypertension: Secondary | ICD-10-CM | POA: Diagnosis not present

## 2021-04-28 DIAGNOSIS — N3946 Mixed incontinence: Secondary | ICD-10-CM | POA: Diagnosis not present

## 2021-04-28 DIAGNOSIS — M6281 Muscle weakness (generalized): Secondary | ICD-10-CM | POA: Diagnosis not present

## 2021-04-28 DIAGNOSIS — R2681 Unsteadiness on feet: Secondary | ICD-10-CM | POA: Diagnosis not present

## 2021-04-28 DIAGNOSIS — R29898 Other symptoms and signs involving the musculoskeletal system: Secondary | ICD-10-CM | POA: Diagnosis not present

## 2021-04-28 DIAGNOSIS — Z9181 History of falling: Secondary | ICD-10-CM | POA: Diagnosis not present

## 2021-04-29 DIAGNOSIS — Z9181 History of falling: Secondary | ICD-10-CM | POA: Diagnosis not present

## 2021-04-29 DIAGNOSIS — R2681 Unsteadiness on feet: Secondary | ICD-10-CM | POA: Diagnosis not present

## 2021-04-29 DIAGNOSIS — R29898 Other symptoms and signs involving the musculoskeletal system: Secondary | ICD-10-CM | POA: Diagnosis not present

## 2021-04-29 DIAGNOSIS — I10 Essential (primary) hypertension: Secondary | ICD-10-CM | POA: Diagnosis not present

## 2021-04-29 DIAGNOSIS — M6281 Muscle weakness (generalized): Secondary | ICD-10-CM | POA: Diagnosis not present

## 2021-04-29 DIAGNOSIS — N3946 Mixed incontinence: Secondary | ICD-10-CM | POA: Diagnosis not present

## 2021-05-01 DIAGNOSIS — N3946 Mixed incontinence: Secondary | ICD-10-CM | POA: Diagnosis not present

## 2021-05-01 DIAGNOSIS — Z9181 History of falling: Secondary | ICD-10-CM | POA: Diagnosis not present

## 2021-05-01 DIAGNOSIS — R2681 Unsteadiness on feet: Secondary | ICD-10-CM | POA: Diagnosis not present

## 2021-05-01 DIAGNOSIS — M6281 Muscle weakness (generalized): Secondary | ICD-10-CM | POA: Diagnosis not present

## 2021-05-01 DIAGNOSIS — R29898 Other symptoms and signs involving the musculoskeletal system: Secondary | ICD-10-CM | POA: Diagnosis not present

## 2021-05-01 DIAGNOSIS — I10 Essential (primary) hypertension: Secondary | ICD-10-CM | POA: Diagnosis not present

## 2021-05-15 DIAGNOSIS — R1312 Dysphagia, oropharyngeal phase: Secondary | ICD-10-CM | POA: Diagnosis not present

## 2021-05-15 DIAGNOSIS — R1310 Dysphagia, unspecified: Secondary | ICD-10-CM | POA: Diagnosis not present

## 2021-05-28 DIAGNOSIS — R1312 Dysphagia, oropharyngeal phase: Secondary | ICD-10-CM | POA: Diagnosis not present

## 2021-05-28 DIAGNOSIS — R1310 Dysphagia, unspecified: Secondary | ICD-10-CM | POA: Diagnosis not present

## 2021-05-30 DIAGNOSIS — Z20822 Contact with and (suspected) exposure to covid-19: Secondary | ICD-10-CM | POA: Diagnosis not present

## 2021-06-04 ENCOUNTER — Encounter: Payer: Self-pay | Admitting: Nurse Practitioner

## 2021-06-04 ENCOUNTER — Non-Acute Institutional Stay (SKILLED_NURSING_FACILITY): Payer: Medicare Other | Admitting: Nurse Practitioner

## 2021-06-04 DIAGNOSIS — D6859 Other primary thrombophilia: Secondary | ICD-10-CM | POA: Diagnosis not present

## 2021-06-04 DIAGNOSIS — N183 Chronic kidney disease, stage 3 unspecified: Secondary | ICD-10-CM

## 2021-06-04 DIAGNOSIS — E039 Hypothyroidism, unspecified: Secondary | ICD-10-CM | POA: Diagnosis not present

## 2021-06-04 DIAGNOSIS — I480 Paroxysmal atrial fibrillation: Secondary | ICD-10-CM

## 2021-06-04 DIAGNOSIS — E785 Hyperlipidemia, unspecified: Secondary | ICD-10-CM

## 2021-06-04 DIAGNOSIS — Z8719 Personal history of other diseases of the digestive system: Secondary | ICD-10-CM | POA: Diagnosis not present

## 2021-06-04 DIAGNOSIS — I10 Essential (primary) hypertension: Secondary | ICD-10-CM

## 2021-06-04 DIAGNOSIS — R350 Frequency of micturition: Secondary | ICD-10-CM

## 2021-06-04 DIAGNOSIS — I2510 Atherosclerotic heart disease of native coronary artery without angina pectoris: Secondary | ICD-10-CM | POA: Diagnosis not present

## 2021-06-04 DIAGNOSIS — K5901 Slow transit constipation: Secondary | ICD-10-CM

## 2021-06-04 DIAGNOSIS — G4700 Insomnia, unspecified: Secondary | ICD-10-CM | POA: Diagnosis not present

## 2021-06-04 DIAGNOSIS — I270 Primary pulmonary hypertension: Secondary | ICD-10-CM | POA: Diagnosis not present

## 2021-06-04 DIAGNOSIS — I6300 Cerebral infarction due to thrombosis of unspecified precerebral artery: Secondary | ICD-10-CM | POA: Diagnosis not present

## 2021-06-04 NOTE — Progress Notes (Signed)
Location:   [facility] Room Number: 81 Place of Service:  SNF (31) Provider: Lennie Odor Elena Cothern NP  Vasco Chong X, NP  Patient Care Team: Joscelynn Brutus X, NP as PCP - General (Internal Medicine) Belva Crome, MD as PCP - Cardiology (Cardiology)  Extended Emergency Contact Information Primary Emergency Contact: Carson Myrtle Address: [address]          [place], [place] of [place] Phone: [phone] Relation: Spouse Secondary Emergency Contact: Alan Ripper Address: [place], [place] [address] [place] Phone: [phone] Relation: Daughter  Code Status:  DNR Goals of care: Advanced Directive information Advanced Directives 03/17/2021  Does Patient Have a Medical Advance Directive? Yes  Type of Paramedic of [place];Living will  Does patient want to make changes to medical advance directive? No - Patient declined  Copy of [place] in Chart? Yes - validated most recent copy scanned in chart (See row information)  Would patient like information on creating a medical advance directive? -     Chief Complaint  Patient presents with   Medical Management of Chronic Issues    HPI:  Pt is a 87 y.o. male seen today for medical management of chronic diseases.                 Afib, tachy brady, refused PPM, heart rate is in control. EKG 12/05/19 Afib at cardiology, only takes ASA, TSH 3.13 01/14/21.             Thrombophilia, had work up in the past, venous US negative DVT in legs.               Urinary frequency, no difference since off Mirabegran due to cost, had urinary retention, s/p ED eval 11/22/20, f/u urology             CAD, ASA, Fish oil, Coronary artery bypass grafting 1992             Hx of GI bleed, Hgb 13.6 01/14/21             CVA, no apparent focal weakness.             HTN, takes Telmisartan Pulmonary hypertension/edema BLE, takes Furosemide, [place] 6/18/21negative  DVT Insomnia, takes Zolpidem             Hypothyroidism, takes Levothyroxine, TSH 3.13 01/14/21             CKD Bun/creat 25/1.29 eGFR 50 01/15/21             Hyperlipidemia, LDL 107 05/05/20, on Omega 3 supplement             Constipation, takes MiraLax, Senokot S   Past Medical History:  Diagnosis Date   Atrial fibrillation (HCC)    CAD (coronary artery disease)    Diverticulosis    Hypercholesteremia    Hypertension    Hypothyroidism    Insomnia    Murmur    Paroxysmal atrial fibrillation (HCC)    Urinary dysfunction    Past Surgical History:  Procedure Laterality Date   BYPASS GRAFT     HEMORRHOID SURGERY     HERNIA REPAIR     HIP SURGERY      Allergies  Allergen Reactions   Shellfish Allergy Swelling and Rash    Allergies as of 06/04/2021       Reactions  Shellfish Allergy Swelling, Rash        Medication List        Accurate as of June 04, 2021 11:59 PM. If you have any questions, ask your nurse or doctor.          aspirin 81 MG EC tablet in the morning and at bedtime.   cholecalciferol 1000 units tablet Commonly known as: VITAMIN D Take 1,000 Units by mouth daily.   furosemide 40 MG tablet Commonly known as: LASIX Take 40 mg by mouth daily.   levothyroxine 25 MCG tablet Commonly known as: SYNTHROID Take 25 mcg by mouth daily before breakfast.   Omega-3 1000 MG Caps Take by mouth in the morning and at bedtime.   polyethylene glycol 17 g packet Commonly known as: MIRALAX / GLYCOLAX Take 17 g by mouth daily.   PRESERVISION AREDS 2 PO Take by mouth daily.   CENTRUM SILVER PO Take by mouth daily.   saccharomyces boulardii 250 MG capsule Commonly known as: FLORASTOR Take 250 mg by mouth daily.   senna 8.6 MG tablet Commonly known as: SENOKOT Take 2 tablets by mouth daily as needed.   telmisartan 80 MG tablet Commonly known as: MICARDIS Take 80 mg by mouth daily.   vitamin C 1000 MG tablet Take 1,000 mg by mouth in the  morning and at bedtime.   zolpidem 5 MG tablet Commonly known as: AMBIEN TAKE ONE TABLET AT BEDTIME AS NEEDED FOR SLEEP.        Review of Systems  Constitutional:  Negative for activity change, appetite change and fever.  HENT:  Positive for hearing loss. Negative for congestion and voice change.   Respiratory:  Positive for shortness of breath. Negative for cough and wheezing.        Occasional nocturnal orthopnea   Cardiovascular:  Positive for leg swelling. Negative for chest pain and palpitations.  Gastrointestinal:  Negative for abdominal pain and constipation.  Genitourinary:  Positive for frequency. Negative for difficulty urinating and urgency.  Musculoskeletal:  Positive for arthralgias and gait problem.  Neurological:  Negative for speech difficulty, weakness and headaches.  Psychiatric/Behavioral:  Negative for confusion and sleep disturbance. The patient is not nervous/anxious.    Immunization History  Administered Date(s) Administered   Influenza, High Dose Seasonal PF 04/11/2017, 03/26/2020   Influenza-Unspecified 03/26/2020   Moderna Sars-Covid-2 Vaccination 06/18/2019, 07/16/2019   Pneumococcal-Unspecified 08/12/2012   Tetanus 06/14/2004   Zoster Recombinat (Shingrix) 06/02/2017   Pertinent  Health Maintenance Due  Topic Date Due   INFLUENZA VACCINE  01/12/2021   Fall Risk 05/16/2020 08/12/2020 09/18/2020 11/22/2020  Falls in the past year? 0 0 0 -  Was there an injury with Fall? - - 0 -  Fall Risk Category Calculator - - 0 -  Fall Risk Category - - Low -  Patient Fall Risk Level - - Low fall risk Low fall risk   Functional Status Survey:    Vitals:   06/04/21 1140  BP: 124/84  Pulse: 76  Resp: 18  Temp: 98 F (36.7 C)  SpO2: 93%   There is no height or weight on file to calculate BMI. Physical Exam Vitals and nursing note reviewed.  Constitutional:      Appearance: Normal appearance.  HENT:     Head: Normocephalic and atraumatic.      Mouth/Throat:     Mouth: Mucous membranes are moist.  Eyes:     Extraocular Movements: Extraocular movements intact.     Conjunctiva/sclera: Conjunctivae normal.  Pupils: Pupils are equal, round, and reactive to light.  Cardiovascular:     Rate and Rhythm: Normal rate. Rhythm irregular.     Heart sounds: Murmur heard.  Pulmonary:     Effort: Pulmonary effort is normal.     Breath sounds: Rales present.     Comments: Bibasilar rales.  Abdominal:     General: Bowel sounds are normal.     Palpations: Abdomen is soft.     Tenderness: There is no abdominal tenderness.  Musculoskeletal:     Cervical back: Normal range of motion and neck supple.     Right lower leg: Edema present.     Left lower leg: Edema present.     Comments: 2+ edema BLE L>R, uses TED sometimes.  Skin:    General: Skin is warm and dry.  Neurological:     General: No focal deficit present.     Mental Status: He is alert and oriented to person, place, and time. Mental status is at baseline.     Motor: Weakness present.     Coordination: Coordination normal.     Gait: Gait abnormal.     Comments: Left sided weakness form previous CVA, muscle strength 5/5  Psychiatric:        Mood and Affect: Mood normal.        Behavior: Behavior normal.        Thought Content: Thought content normal.        Judgment: Judgment normal.    Labs reviewed: Recent Labs    11/22/20 1129 12/26/20 0000 01/14/21 0843  NA 130* 130* 134*  K 4.0 4.3 4.2  CL 99 97* 100  CO2 22 23* 27  GLUCOSE 89  --  89  BUN '20 20 25  ' CREATININE 1.25* 1.3 1.29*  CALCIUM 8.5* 8.7 8.8   Recent Labs    11/22/20 1129 12/26/20 0000 01/14/21 0843  AST '18 15 16  ' ALT '9 11 11  ' ALKPHOS 55 52  --   BILITOT 0.6  --  0.6  PROT 6.4*  --  6.2  ALBUMIN 3.3* 3.2*  --    Recent Labs    12/26/20 0000 01/14/21 0843  WBC 9.8 5.9  NEUTROABS 6,831.00 2,921  HGB 13.8 13.6  HCT 40* 40.6  MCV  --  96.9  PLT 221 254   Lab Results  Component Value  Date   TSH 3.13 01/14/2021   No results found for: HGBA1C Lab Results  Component Value Date   CHOL 183 05/05/2020   HDL 54 05/05/2020   LDLCALC 107 (H) 05/05/2020   TRIG 128 05/05/2020   CHOLHDL 3.4 05/05/2020    Significant Diagnostic Results in last 30 days:  No results found.  Assessment/Plan  Paroxysmal atrial fibrillation tachy brady, refused PPM, heart rate is in control. EKG 12/05/19 Afib at cardiology, only takes ASA, TSH 3.13 01/14/21.  Thrombophilia ([place]) had work up in the past, venous US negative DVT in legs.  Urinary frequency no difference since off Mirabegran due to cost, had urinary retention, s/p ED eval 11/22/20, f/u urology  CAD (coronary artery disease) ASA, Fish oil, Coronary artery bypass grafting 1992, no chest pain since last seen.   History of GI bleed Hx of GI bleed, Hgb 13.6 01/14/21  CVA (cerebral vascular accident) ([place]) no apparent focal weakness.  Hypertension Blood pressure is controlled,  takes Telmisartan  Pulmonary hypertension, primary ([place]) Pulmonary hypertension/edema BLE, takes Furosemide, [place] 6/18/21negative DVT  Insomnia Stable, continue Zolpidem  Hypothyroidism takes  Levothyroxine, TSH 3.13 01/14/21  CKD (chronic kidney disease) stage 3, GFR 30-59 ml/min (HCC) Bun/creat 25/1.29 eGFR 50 01/15/21  Hyperlipidemia  LDL 107 05/05/20, on Omega 3 supplement  Slow transit constipation takes MiraLax, Senokot S   Family/ staff Communication: plan of care reviewed with the patient and charge nurse.   Labs/tests ordered: none  Time spend 35 minutes.

## 2021-06-04 NOTE — Assessment & Plan Note (Signed)
LDL 107 05/05/20, on Omega 3 supplement 

## 2021-06-04 NOTE — Assessment & Plan Note (Signed)
ASA, Fish oil, Coronary artery bypass grafting 1992, no chest pain since last seen.

## 2021-06-04 NOTE — Assessment & Plan Note (Signed)
no apparent focal weakness.

## 2021-06-04 NOTE — Assessment & Plan Note (Signed)
takes MiraLax, Senokot S 

## 2021-06-04 NOTE — Assessment & Plan Note (Signed)
Pulmonary hypertension/edema BLE, takes Furosemide, [place] 6/18/21negative DVT

## 2021-06-04 NOTE — Assessment & Plan Note (Signed)
Bun/creat 25/1.29 eGFR 50 01/15/21 

## 2021-06-04 NOTE — Assessment & Plan Note (Signed)
tachy brady, refused PPM, heart rate is in control. EKG 12/05/19 Afib at cardiology, only takes ASA, TSH 3.13 01/14/21.

## 2021-06-04 NOTE — Assessment & Plan Note (Signed)
takes Levothyroxine, TSH 3.13 01/14/21

## 2021-06-04 NOTE — Assessment & Plan Note (Signed)
Hx of GI bleed, Hgb 13.6 01/14/21

## 2021-06-04 NOTE — Assessment & Plan Note (Signed)
had work up in the past, venous US negative DVT in legs.  

## 2021-06-04 NOTE — Assessment & Plan Note (Signed)
Stable, continue Zolpidem

## 2021-06-04 NOTE — Assessment & Plan Note (Signed)
no difference since off Mirabegran due to cost, had urinary retention, s/p ED eval 11/22/20, f/u urology 

## 2021-06-04 NOTE — Assessment & Plan Note (Signed)
Blood pressure is controlled,  takes Telmisartan

## 2021-06-05 ENCOUNTER — Encounter: Payer: Self-pay | Admitting: Nurse Practitioner

## 2021-06-17 ENCOUNTER — Other Ambulatory Visit: Payer: Self-pay | Admitting: Orthopedic Surgery

## 2021-06-17 DIAGNOSIS — G4700 Insomnia, unspecified: Secondary | ICD-10-CM

## 2021-06-17 MED ORDER — ZOLPIDEM TARTRATE 5 MG PO TABS: ORAL_TABLET | ORAL | 0 refills | Status: DC

## 2021-07-03 ENCOUNTER — Non-Acute Institutional Stay (SKILLED_NURSING_FACILITY): Payer: Medicare PPO | Admitting: Nurse Practitioner

## 2021-07-03 ENCOUNTER — Encounter: Payer: Self-pay | Admitting: Nurse Practitioner

## 2021-07-03 DIAGNOSIS — Z8719 Personal history of other diseases of the digestive system: Secondary | ICD-10-CM

## 2021-07-03 DIAGNOSIS — R350 Frequency of micturition: Secondary | ICD-10-CM | POA: Diagnosis not present

## 2021-07-03 DIAGNOSIS — I10 Essential (primary) hypertension: Secondary | ICD-10-CM

## 2021-07-03 DIAGNOSIS — D6859 Other primary thrombophilia: Secondary | ICD-10-CM

## 2021-07-03 DIAGNOSIS — G4700 Insomnia, unspecified: Secondary | ICD-10-CM

## 2021-07-03 DIAGNOSIS — E039 Hypothyroidism, unspecified: Secondary | ICD-10-CM

## 2021-07-03 DIAGNOSIS — I6300 Cerebral infarction due to thrombosis of unspecified precerebral artery: Secondary | ICD-10-CM

## 2021-07-03 DIAGNOSIS — I2510 Atherosclerotic heart disease of native coronary artery without angina pectoris: Secondary | ICD-10-CM

## 2021-07-03 DIAGNOSIS — I270 Primary pulmonary hypertension: Secondary | ICD-10-CM

## 2021-07-03 DIAGNOSIS — I480 Paroxysmal atrial fibrillation: Secondary | ICD-10-CM

## 2021-07-03 DIAGNOSIS — N183 Chronic kidney disease, stage 3 unspecified: Secondary | ICD-10-CM

## 2021-07-03 DIAGNOSIS — K5901 Slow transit constipation: Secondary | ICD-10-CM

## 2021-07-03 DIAGNOSIS — E785 Hyperlipidemia, unspecified: Secondary | ICD-10-CM

## 2021-07-03 NOTE — Progress Notes (Signed)
Location:   [facility] Room Number: 19 Place of Service:  SNF (31) Provider: Lennie Odor Rayneisha Bouza NP  Janmarie Smoot X, NP  Patient Care Team: Bailee Thall X, NP as PCP - General (Internal Medicine) Belva Crome, MD as PCP - Cardiology (Cardiology)  Extended Emergency Contact Information Primary Emergency Contact: Carson Myrtle Address: [address]          [place], [place] of [place] Phone: [phone] Relation: Spouse Secondary Emergency Contact: Alan Ripper Address: [place], [place] [address] [place] of [place] Phone: [phone] Relation: Daughter  Code Status:  DNR Goals of care: Advanced Directive information Advanced Directives 03/17/2021  Does Patient Have a Medical Advance Directive? Yes  Type of Paramedic of [place];Living will  Does patient want to make changes to medical advance directive? No - Patient declined  Copy of [place] in Chart? Yes - validated most recent copy scanned in chart (See row information)  Would patient like information on creating a medical advance directive? -     Chief Complaint  Patient presents with   Medical Management of Chronic Issues    HPI:  Pt is a 88 y.o. male seen today for medical management of chronic diseases.       Afib, tachy brady, refused PPM, heart rate is in control. EKG 12/05/19 Afib at cardiology, only takes ASA, TSH 3.13 01/14/21.             Thrombophilia, had work up in the past, venous US negative DVT in legs.               Urinary frequency, no difference since off Mirabegran due to cost, had urinary retention, s/p ED eval 11/22/20, f/u urology             CAD, ASA, Fish oil, Coronary artery bypass grafting 1992             Hx of GI bleed, Hgb 13.6 01/14/21             CVA, no apparent focal weakness.             HTN, takes Telmisartan Pulmonary hypertension/edema BLE, takes Furosemide, [place] 6/18/21negative DVT Insomnia,  takes Zolpidem             Hypothyroidism, takes Levothyroxine, TSH 3.13 01/14/21             CKD Bun/creat 25/1.29 eGFR 50 01/15/21             Hyperlipidemia, LDL 107 05/05/20, on Omega 3 supplement             Constipation, takes MiraLax, Senokot S    Past Medical History:  Diagnosis Date   Atrial fibrillation (HCC)    CAD (coronary artery disease)    Diverticulosis    Hypercholesteremia    Hypertension    Hypothyroidism    Insomnia    Murmur    Paroxysmal atrial fibrillation (HCC)    Urinary dysfunction    Past Surgical History:  Procedure Laterality Date   BYPASS GRAFT     HEMORRHOID SURGERY     HERNIA REPAIR     HIP SURGERY      Allergies  Allergen Reactions   Shellfish Allergy Swelling and Rash    Allergies as of 07/03/2021       Reactions   Shellfish Allergy Swelling, Rash  Medication List        Accurate as of July 03, 2021 11:59 PM. If you have any questions, ask your nurse or doctor.          aspirin 81 MG EC tablet in the morning and at bedtime.   cholecalciferol 1000 units tablet Commonly known as: VITAMIN D Take 1,000 Units by mouth daily.   furosemide 40 MG tablet Commonly known as: LASIX Take 40 mg by mouth daily.   levothyroxine 25 MCG tablet Commonly known as: SYNTHROID Take 25 mcg by mouth daily before breakfast.   Omega-3 1000 MG Caps Take by mouth in the morning and at bedtime.   polyethylene glycol 17 g packet Commonly known as: MIRALAX / GLYCOLAX Take 17 g by mouth daily.   PRESERVISION AREDS 2 PO Take by mouth daily.   CENTRUM SILVER PO Take by mouth daily.   saccharomyces boulardii 250 MG capsule Commonly known as: FLORASTOR Take 250 mg by mouth daily.   senna 8.6 MG tablet Commonly known as: SENOKOT Take 2 tablets by mouth daily as needed.   telmisartan 80 MG tablet Commonly known as: MICARDIS Take 80 mg by mouth daily.   vitamin C 1000 MG tablet Take 1,000 mg by mouth in the morning and at  bedtime.   zolpidem 5 MG tablet Commonly known as: AMBIEN TAKE ONE TABLET AT BEDTIME AS NEEDED FOR SLEEP.        Review of Systems  Constitutional:  Negative for activity change, appetite change and fever.  HENT:  Positive for hearing loss. Negative for congestion and voice change.   Respiratory:  Positive for shortness of breath. Negative for cough and wheezing.        Occasional nocturnal orthopnea   Cardiovascular:  Positive for leg swelling. Negative for chest pain and palpitations.  Gastrointestinal:  Negative for abdominal pain and constipation.  Genitourinary:  Positive for frequency. Negative for difficulty urinating and urgency.  Musculoskeletal:  Positive for arthralgias and gait problem.  Neurological:  Negative for speech difficulty, weakness and headaches.  Psychiatric/Behavioral:  Negative for confusion and sleep disturbance. The patient is not nervous/anxious.    Immunization History  Administered Date(s) Administered   Influenza, High Dose Seasonal PF 04/11/2017, 03/26/2020   Influenza-Unspecified 03/26/2020   Moderna Sars-Covid-2 Vaccination 06/18/2019, 07/16/2019   Pneumococcal-Unspecified 08/12/2012   Tetanus 06/14/2004   Zoster Recombinat (Shingrix) 06/02/2017   Pertinent  Health Maintenance Due  Topic Date Due   INFLUENZA VACCINE  01/12/2021   Fall Risk 05/16/2020 08/12/2020 09/18/2020 11/22/2020  Falls in the past year? 0 0 0 -  Was there an injury with Fall? - - 0 -  Fall Risk Category Calculator - - 0 -  Fall Risk Category - - Low -  Patient Fall Risk Level - - Low fall risk Low fall risk   Functional Status Survey:    Vitals:   07/03/21 1228  BP: 132/90  Pulse: 61  Resp: 18  Temp: 97.6 F (36.4 C)  SpO2: 97%   There is no height or weight on file to calculate BMI. Physical Exam Vitals and nursing note reviewed.  Constitutional:      Appearance: Normal appearance.  HENT:     Head: Normocephalic and atraumatic.     Mouth/Throat:      Mouth: Mucous membranes are moist.  Eyes:     Extraocular Movements: Extraocular movements intact.     Conjunctiva/sclera: Conjunctivae normal.     Pupils: Pupils are equal, round, and reactive  to light.  Cardiovascular:     Rate and Rhythm: Normal rate. Rhythm irregular.     Heart sounds: Murmur heard.  Pulmonary:     Effort: Pulmonary effort is normal.     Breath sounds: Rales present.     Comments: Bibasilar rales.  Abdominal:     General: Bowel sounds are normal.     Palpations: Abdomen is soft.     Tenderness: There is no abdominal tenderness.  Musculoskeletal:     Cervical back: Normal range of motion and neck supple.     Right lower leg: Edema present.     Left lower leg: Edema present.     Comments: 2+ edema BLE L>R, uses TED sometimes.  Skin:    General: Skin is warm and dry.  Neurological:     General: No focal deficit present.     Mental Status: He is alert and oriented to person, place, and time. Mental status is at baseline.     Motor: Weakness present.     Coordination: Coordination normal.     Gait: Gait abnormal.     Comments: Left sided weakness form previous CVA, muscle strength 5/5  Psychiatric:        Mood and Affect: Mood normal.        Behavior: Behavior normal.        Thought Content: Thought content normal.        Judgment: Judgment normal.    Labs reviewed: Recent Labs    11/22/20 1129 12/26/20 0000 01/14/21 0843  NA 130* 130* 134*  K 4.0 4.3 4.2  CL 99 97* 100  CO2 22 23* 27  GLUCOSE 89  --  89  BUN '20 20 25  ' CREATININE 1.25* 1.3 1.29*  CALCIUM 8.5* 8.7 8.8   Recent Labs    11/22/20 1129 12/26/20 0000 01/14/21 0843  AST '18 15 16  ' ALT '9 11 11  ' ALKPHOS 55 52  --   BILITOT 0.6  --  0.6  PROT 6.4*  --  6.2  ALBUMIN 3.3* 3.2*  --    Recent Labs    12/26/20 0000 01/14/21 0843  WBC 9.8 5.9  NEUTROABS 6,831.00 2,921  HGB 13.8 13.6  HCT 40* 40.6  MCV  --  96.9  PLT 221 254   Lab Results  Component Value Date   TSH 3.13  01/14/2021   No results found for: HGBA1C Lab Results  Component Value Date   CHOL 183 05/05/2020   HDL 54 05/05/2020   LDLCALC 107 (H) 05/05/2020   TRIG 128 05/05/2020   CHOLHDL 3.4 05/05/2020    Significant Diagnostic Results in last 30 days:  No results found.  Assessment/Plan  Urinary frequency  no difference since off Mirabegran due to cost, had urinary retention, s/p ED eval 11/22/20, f/u urology  CAD (coronary artery disease) ASA, Fish oil, Coronary artery bypass grafting 1992  History of GI bleed Hx of GI bleed, Hgb 13.6 01/14/21  CVA (cerebral vascular accident) ([place])  no apparent focal weakness.  Hypertension Blood pressure is controlled,  takes Telmisartan  Pulmonary hypertension, primary ([place]) Pulmonary hypertension/edema BLE, takes Furosemide, [place] 6/18/21negative DVT  Insomnia Long standing taking Zolpidem, the patient understands B vs R  Hypothyroidism takes Levothyroxine, TSH 3.13 01/14/21  CKD (chronic kidney disease) stage 3, GFR 30-59 ml/min (HCC) Bun/creat 25/1.29 eGFR 50 01/15/21  Hyperlipidemia  LDL 107 05/05/20, on Omega 3 supplement  Slow transit constipation takes MiraLax, Senokot S  Paroxysmal atrial fibrillation  tachy  brady, refused PPM, heart rate is in control. EKG 12/05/19 Afib at cardiology, only takes ASA, TSH 3.13 01/14/21.  Thrombophilia ([place]) had work up in the past, venous US negative DVT in legs.    Family/ staff Communication: plan of care reviewed with the patient and charge nurse.   Labs/tests ordered: none  Time spend 35 minutes.

## 2021-07-03 NOTE — Assessment & Plan Note (Signed)
tachy brady, refused PPM, heart rate is in control. EKG 12/05/19 Afib at cardiology, only takes ASA, TSH 3.13 01/14/21.

## 2021-07-03 NOTE — Assessment & Plan Note (Signed)
Long standing taking Zolpidem, the patient understands B vs R

## 2021-07-03 NOTE — Assessment & Plan Note (Signed)
Pulmonary hypertension/edema BLE, takes Furosemide, [place] 6/18/21negative DVT

## 2021-07-03 NOTE — Assessment & Plan Note (Signed)
takes Levothyroxine, TSH 3.13 01/14/21

## 2021-07-03 NOTE — Assessment & Plan Note (Signed)
LDL 107 05/05/20, on Omega 3 supplement 

## 2021-07-03 NOTE — Assessment & Plan Note (Signed)
ASA, Fish oil, Coronary artery bypass grafting 1992

## 2021-07-03 NOTE — Assessment & Plan Note (Signed)
no difference since off Mirabegran due to cost, had urinary retention, s/p ED eval 11/22/20, f/u urology 

## 2021-07-03 NOTE — Assessment & Plan Note (Signed)
Hx of GI bleed, Hgb 13.6 01/14/21

## 2021-07-03 NOTE — Assessment & Plan Note (Signed)
no apparent focal weakness.

## 2021-07-03 NOTE — Assessment & Plan Note (Signed)
Bun/creat 25/1.29 eGFR 50 01/15/21 

## 2021-07-03 NOTE — Assessment & Plan Note (Signed)
takes MiraLax, Senokot S 

## 2021-07-03 NOTE — Assessment & Plan Note (Signed)
Blood pressure is controlled,  takes Telmisartan

## 2021-07-03 NOTE — Assessment & Plan Note (Signed)
had work up in the past, venous US negative DVT in legs.  

## 2021-07-08 ENCOUNTER — Encounter: Payer: Self-pay | Admitting: Orthopedic Surgery

## 2021-07-08 ENCOUNTER — Non-Acute Institutional Stay (SKILLED_NURSING_FACILITY): Payer: Medicare PPO | Admitting: Orthopedic Surgery

## 2021-07-08 DIAGNOSIS — I480 Paroxysmal atrial fibrillation: Secondary | ICD-10-CM

## 2021-07-08 DIAGNOSIS — G4700 Insomnia, unspecified: Secondary | ICD-10-CM

## 2021-07-08 DIAGNOSIS — E039 Hypothyroidism, unspecified: Secondary | ICD-10-CM

## 2021-07-08 DIAGNOSIS — I10 Essential (primary) hypertension: Secondary | ICD-10-CM | POA: Diagnosis not present

## 2021-07-08 DIAGNOSIS — N183 Chronic kidney disease, stage 3 unspecified: Secondary | ICD-10-CM

## 2021-07-08 DIAGNOSIS — R58 Hemorrhage, not elsewhere classified: Secondary | ICD-10-CM

## 2021-07-08 DIAGNOSIS — I2510 Atherosclerotic heart disease of native coronary artery without angina pectoris: Secondary | ICD-10-CM | POA: Diagnosis not present

## 2021-07-08 NOTE — Progress Notes (Signed)
Location:   [facility] Room Number: 21 Place of Service:  ALF [phone]) Provider:  Windell Moulding, NP  Mast, Man X, NP  Patient Care Team: Mast, Man X, NP as PCP - General (Internal Medicine) Belva Crome, MD as PCP - Cardiology (Cardiology)  Extended Emergency Contact Information Primary Emergency Contact: Carson Myrtle Address: [address]          [place], [place] of [place] Phone: [phone] Relation: Spouse Secondary Emergency Contact: Alan Ripper Address: [place], [place] [address] [place] of [place] Phone: [phone] Relation: Daughter  Code Status:  DNR Goals of care: Advanced Directive information Advanced Directives 07/08/2021  Does Patient Have a Medical Advance Directive? Yes  Type of Advance Directive Living will;Out of facility DNR (pink MOST or yellow form)  Does patient want to make changes to medical advance directive? No - Patient declined  Copy of [place] in Chart? -  Would patient like information on creating a medical advance directive? -  Pre-existing out of facility DNR order (yellow form or pink MOST form) Pink MOST form placed in chart (order not valid for inpatient use);Yellow form placed in chart (order not valid for inpatient use)     Chief Complaint  Patient presents with   Acute Visit    Bruising left forearm    HPI:  Pt is a 88 y.o. male seen today for an acute visit for right wrist bruising.   This morning staff noticed a large purple bruise to left forearm. He does not recall how he obtained wound. Denies pain to any other parts of his body. No recent falls. Ambulates with rolator. He is currently taking aspirin 81 mg daily.   Afib/tachy-brady- HR controlled without medication, refused PPM in past, EKG afib 11/2019, remains on aspirin HTN- BUN/creat 17/1.3 03/20/2021, remains on telmisartan CAD- CABG 1992, remains on aspirin and fish oil CKD-  BUN/creat 17/1.3 03/20/2021 BLE- remains on lasix  Hypothyroidism- TSH 3.13 01/2021, remains on levothyroxine Insomnia- remains on ambien prn   Past Medical History:  Diagnosis Date   Atrial fibrillation ([place])    CAD (coronary artery disease)    Diverticulosis    Hypercholesteremia    Hypertension    Hypothyroidism    Insomnia    Murmur    Paroxysmal atrial fibrillation ([place])    Urinary dysfunction    Past Surgical History:  Procedure Laterality Date   BYPASS GRAFT     HEMORRHOID SURGERY     HERNIA REPAIR     HIP SURGERY      Allergies  Allergen Reactions   Shellfish Allergy Swelling and Rash    Allergies as of 07/08/2021       Reactions   Shellfish Allergy Swelling, Rash        Medication List        Accurate as of July 08, 2021  8:11 PM. If you have any questions, ask your nurse or doctor.          STOP taking these medications    senna 8.6 MG tablet Commonly known as: SENOKOT Stopped by: Yvonna Alanis, NP       TAKE these medications    aspirin 81 MG EC tablet in the morning and at bedtime.   cholecalciferol 1000 units tablet Commonly known as: VITAMIN D Take 1,000 Units by mouth daily.   furosemide 40 MG tablet Commonly known  as: LASIX Take 40 mg by mouth daily.   levothyroxine 25 MCG tablet Commonly known as: SYNTHROID Take 25 mcg by mouth daily before breakfast.   Omega-3 1000 MG Caps Take by mouth in the morning and at bedtime.   omega-3 acid ethyl esters 1 g capsule Commonly known as: LOVAZA Take 1 g by mouth 2 (two) times daily.   polyethylene glycol 17 g packet Commonly known as: MIRALAX / GLYCOLAX Take 17 g by mouth daily.   PRESERVISION AREDS 2 PO Take by mouth daily. What changed: Another medication with the same name was removed. Continue taking this medication, and follow the directions you see here. Changed by: Yvonna Alanis, NP   Therems-M Tabs Take 1 tablet by mouth daily. What changed: Another medication  with the same name was removed. Continue taking this medication, and follow the directions you see here. Changed by: Yvonna Alanis, NP   saccharomyces boulardii 250 MG capsule Commonly known as: FLORASTOR Take 250 mg by mouth daily.   telmisartan 40 MG tablet Commonly known as: MICARDIS Take 40 mg by mouth daily.   vitamin C 1000 MG tablet Take 1,000 mg by mouth in the morning and at bedtime.   zolpidem 5 MG tablet Commonly known as: AMBIEN TAKE ONE TABLET AT BEDTIME AS NEEDED FOR SLEEP.        Review of Systems  Constitutional:  Negative for activity change, appetite change, chills, fatigue and fever.  HENT:  Positive for hearing loss and trouble swallowing.   Eyes:  Negative for visual disturbance.  Respiratory:  Negative for cough, shortness of breath and wheezing.   Cardiovascular:  Positive for leg swelling. Negative for chest pain.  Gastrointestinal:  Positive for constipation. Negative for abdominal distention, abdominal pain, diarrhea and nausea.  Genitourinary:  Positive for frequency. Negative for dysuria and hematuria.  Musculoskeletal:  Positive for arthralgias and gait problem.  Skin:  Positive for wound.  Neurological:  Positive for weakness. Negative for dizziness and headaches.  Psychiatric/Behavioral:  Positive for sleep disturbance. Negative for confusion and dysphoric mood. The patient is not nervous/anxious.    Immunization History  Administered Date(s) Administered   Influenza, High Dose Seasonal PF 04/11/2017, 03/26/2020   Influenza-Unspecified 03/26/2020   Moderna Sars-Covid-2 Vaccination 06/18/2019, 07/16/2019   Pneumococcal-Unspecified 08/12/2012   Tetanus 06/14/2004   Zoster Recombinat (Shingrix) 06/02/2017   Pertinent  Health Maintenance Due  Topic Date Due   INFLUENZA VACCINE  01/12/2021   Fall Risk 05/16/2020 08/12/2020 09/18/2020 11/22/2020  Falls in the past year? 0 0 0 -  Was there an injury with Fall? - - 0 -  Fall Risk Category Calculator -  - 0 -  Fall Risk Category - - Low -  Patient Fall Risk Level - - Low fall risk Low fall risk   Functional Status Survey:    Vitals:   07/08/21 1512  BP: (!) 101/57  Pulse: 80  Resp: 18  Temp: 98.2 F (36.8 C)  SpO2: 97%  Weight: 167 lb (75.8 kg)  Height: 5\' 4"  (1.626 m)   Body mass index is 28.67 kg/m. Physical Exam Vitals reviewed.  Constitutional:      General: He is not in acute distress. HENT:     Head: Normocephalic.  Eyes:     General:        Right eye: No discharge.        Left eye: No discharge.  Cardiovascular:     Rate and Rhythm: Normal rate. Rhythm irregular.  Pulses: Normal pulses.     Heart sounds: Murmur heard.  Pulmonary:     Effort: No respiratory distress.     Breath sounds: Rhonchi present. No wheezing.  Abdominal:     General: Bowel sounds are normal. There is no distension.     Palpations: Abdomen is soft.     Tenderness: There is no abdominal tenderness.  Musculoskeletal:     Cervical back: Neck supple.     Right lower leg: Edema present.     Left lower leg: Edema present.     Comments: Non pitting  Skin:    General: Skin is warm and dry.     Capillary Refill: Capillary refill takes less than 2 seconds.     Comments: Large ecchymotic area to left anterior forearm, no skin breakdown, pea-sized nodule palpated, non tender, surrounding skin intact  Neurological:     General: No focal deficit present.     Mental Status: He is alert and oriented to person, place, and time.     Motor: Weakness present.     Gait: Gait abnormal.     Comments: rolator  Psychiatric:        Mood and Affect: Mood normal.        Behavior: Behavior normal.    Labs reviewed: Recent Labs    11/22/20 1129 12/26/20 0000 01/14/21 0843 03/20/21 0000  NA 130* 130* 134* 135*  K 4.0 4.3 4.2 4.5  CL 99 97* 100 99  CO2 22 23* 27 27*  GLUCOSE 89  --  89  --   BUN 20 20 25 17   CREATININE 1.25* 1.3 1.29* 1.3  CALCIUM 8.5* 8.7 8.8 9.1   Recent Labs     11/22/20 1129 12/26/20 0000 01/14/21 0843  AST 18 15 16   ALT 9 11 11   ALKPHOS 55 52  --   BILITOT 0.6  --  0.6  PROT 6.4*  --  6.2  ALBUMIN 3.3* 3.2*  --    Recent Labs    12/26/20 0000 01/14/21 0843  WBC 9.8 5.9  NEUTROABS 6,831.00 2,921  HGB 13.8 13.6  HCT 40* 40.6  MCV  --  96.9  PLT 221 254   Lab Results  Component Value Date   TSH 3.13 01/14/2021   No results found for: HGBA1C Lab Results  Component Value Date   CHOL 183 05/05/2020   HDL 54 05/05/2020   LDLCALC 107 (H) 05/05/2020   TRIG 128 05/05/2020   CHOLHDL 3.4 05/05/2020    Significant Diagnostic Results in last 30 days:  No results found.  Assessment/Plan 1. Ecchymosis of forearm - noticed today by staff - plan to hold aspirin x 3 days - small nodule palpated- suspect hematoma - start warm compresses- 20 min tid x 5 days  2. Paroxysmal atrial fibrillation (HCC) - HR controlled without medication - diagnosed with tachy brady in past- refused PPM  3. Primary hypertension - controlled  - cont telmisaratn  4. Coronary artery disease involving native coronary artery of native heart without angina pectoris - CABG 1992 - cont asa and fish oil  5. Stage 3 chronic kidney disease, unspecified whether stage 3a or 3b CKD ([place]) - continue to avoid nephrotoxic drugs like NSAIDS and dose adjust medications to be renally excreted - encourage hydration  6. Hypothyroidism, unspecified type - TSH normal - cont levothyroxine  7. Insomnia, unspecified type - cont Ambien prn    Family/ staff Communication: plan discussed with patient and nurse  Labs/tests ordered:  none

## 2021-07-21 ENCOUNTER — Non-Acute Institutional Stay (SKILLED_NURSING_FACILITY): Payer: Medicare PPO | Admitting: Internal Medicine

## 2021-07-21 ENCOUNTER — Encounter: Payer: Self-pay | Admitting: Internal Medicine

## 2021-07-21 DIAGNOSIS — R350 Frequency of micturition: Secondary | ICD-10-CM

## 2021-07-21 DIAGNOSIS — I10 Essential (primary) hypertension: Secondary | ICD-10-CM

## 2021-07-21 DIAGNOSIS — I480 Paroxysmal atrial fibrillation: Secondary | ICD-10-CM

## 2021-07-21 DIAGNOSIS — E039 Hypothyroidism, unspecified: Secondary | ICD-10-CM

## 2021-07-21 DIAGNOSIS — N183 Chronic kidney disease, stage 3 unspecified: Secondary | ICD-10-CM

## 2021-07-21 DIAGNOSIS — I2510 Atherosclerotic heart disease of native coronary artery without angina pectoris: Secondary | ICD-10-CM

## 2021-07-21 DIAGNOSIS — G4700 Insomnia, unspecified: Secondary | ICD-10-CM

## 2021-07-21 NOTE — Progress Notes (Signed)
Location:  [facility] Room Number: 74 Place of Service:  SNF (31)  Provider: Virgie Dad   Code Status: DNR Goals of Care:  Advanced Directives 07/21/2021  Does Patient Have a Medical Advance Directive? Yes  Type of Advance Directive Living will;Out of facility DNR (pink MOST or yellow form)  Does patient want to make changes to medical advance directive? No - Patient declined  Copy of [place] in Chart? -  Would patient like information on creating a medical advance directive? -  Pre-existing out of facility DNR order (yellow form or pink MOST form) Pink MOST form placed in chart (order not valid for inpatient use);Yellow form placed in chart (order not valid for inpatient use)     Chief Complaint  Patient presents with   Medical Management of Chronic Issues   Quality Metric Gaps    TDAP      HPI: Patient is a 88 y.o. male seen today for medical management of chronic diseases.     Has h/o PAF Continues to have issues with tachybradycardia. Has refused pacemaker. Not on any anticoagulation. Not on any rate slowing medicines follows with cardiology   Lower extremity edema due to diastolic HF On Low dose of Lasix and uses Ted hose Does not like high doses due to Urinary Frequency   Also has H/o Constipation, Hypothyroidism,h/o CVA CAD ,CKD Hypertension Insomnia and Urinary Frequency  He  is stable.No new Nursing issues. No Behavior issues His weight is stable Walks with his walker and uses Wheelchair Can Do his transfers Alert and Oriented No Falls Wt Readings from Last 3 Encounters:  07/21/21 168 lb 12.8 oz (76.6 kg)  07/08/21 167 lb (75.8 kg)  03/17/21 169 lb 8 oz (76.9 kg)  Only complain of Constipation  Past Medical History:  Diagnosis Date   Atrial fibrillation (HCC)    CAD (coronary artery disease)    Diverticulosis    Hypercholesteremia    Hypertension    Hypothyroidism    Insomnia    Murmur     Paroxysmal atrial fibrillation (HCC)    Urinary dysfunction     Past Surgical History:  Procedure Laterality Date   BYPASS GRAFT     HEMORRHOID SURGERY     HERNIA REPAIR     HIP SURGERY      Allergies  Allergen Reactions   Shellfish Allergy Swelling and Rash    Outpatient Encounter Medications as of 07/21/2021  Medication Sig   Ascorbic Acid (VITAMIN C) 1000 MG tablet Take 1,000 mg by mouth in the morning and at bedtime.   aspirin 81 MG EC tablet in the morning and at bedtime.    cholecalciferol (VITAMIN D) 1000 UNITS tablet Take 1,000 Units by mouth daily.   furosemide (LASIX) 40 MG tablet Take 40 mg by mouth daily.   levothyroxine (SYNTHROID, LEVOTHROID) 25 MCG tablet Take 25 mcg by mouth daily before breakfast.   Multiple Vitamins-Minerals (PRESERVISION AREDS 2 PO) Take by mouth daily.   Multiple Vitamins-Minerals (THEREMS-M) TABS Take 1 tablet by mouth daily.   omega-3 acid ethyl esters (LOVAZA) 1 g capsule Take 1 g by mouth 2 (two) times daily.   polyethylene glycol (MIRALAX / GLYCOLAX) 17 g packet Take 17 g by mouth daily.   saccharomyces boulardii (FLORASTOR) 250 MG capsule Take 250 mg by mouth daily.   telmisartan (MICARDIS) 40 MG tablet Take 20 mg by mouth daily.   zolpidem (AMBIEN) 5 MG tablet TAKE ONE  TABLET AT BEDTIME AS NEEDED FOR SLEEP.   [DISCONTINUED] Omega-3 1000 MG CAPS Take by mouth in the morning and at bedtime.   No facility-administered encounter medications on file as of 07/21/2021.    Review of Systems:  Review of Systems  Constitutional:  Negative for activity change, appetite change and unexpected weight change.  HENT: Negative.    Respiratory:  Positive for shortness of breath. Negative for cough.   Cardiovascular:  Positive for leg swelling.  Gastrointestinal:  Positive for constipation.  Genitourinary:  Positive for frequency.  Musculoskeletal:  Negative for arthralgias, gait problem and myalgias.  Skin: Negative.  Negative for rash.   Neurological:  Negative for dizziness and weakness.  Psychiatric/Behavioral:  Negative for confusion and sleep disturbance.   All other systems reviewed and are negative.  Health Maintenance  Topic Date Due   TETANUS/TDAP  06/14/2014   COVID-19 Vaccine (5 - Booster for Moderna series) 11/06/2021 (Originally 04/28/2021)   Pneumonia Vaccine 55+ Years old  Completed   INFLUENZA VACCINE  Completed   Zoster Vaccines- Shingrix  Completed   HPV VACCINES  Aged Out    Physical Exam: Vitals:   07/21/21 1533  BP: (!) 101/57  Pulse: 80  Resp: 18  Temp: 97.9 F (36.6 C)  SpO2: 96%  Weight: 168 lb 12.8 oz (76.6 kg)  Height: 5\' 4"  (1.626 m)   Body mass index is 28.97 kg/m. Physical Exam Vitals reviewed.  Constitutional:      Appearance: Normal appearance.  HENT:     Head: Normocephalic.     Mouth/Throat:     Mouth: Mucous membranes are moist.     Pharynx: Oropharynx is clear.  Eyes:     Pupils: Pupils are equal, round, and reactive to light.  Cardiovascular:     Rate and Rhythm: Normal rate and regular rhythm.     Pulses: Normal pulses.     Heart sounds: No murmur heard. Pulmonary:     Effort: Pulmonary effort is normal. No respiratory distress.     Breath sounds: Normal breath sounds. No rales.  Abdominal:     General: Abdomen is flat. Bowel sounds are normal.     Palpations: Abdomen is soft.  Musculoskeletal:        General: Swelling present.     Cervical back: Neck supple.  Skin:    General: Skin is warm.  Neurological:     General: No focal deficit present.     Mental Status: He is alert and oriented to person, place, and time.  Psychiatric:        Mood and Affect: Mood normal.        Thought Content: Thought content normal.    Labs reviewed: Basic Metabolic Panel: Recent Labs    11/22/20 1129 12/26/20 0000 01/14/21 0843 03/20/21 0000  NA 130* 130* 134* 135*  K 4.0 4.3 4.2 4.5  CL 99 97* 100 99  CO2 22 23* 27 27*  GLUCOSE 89  --  89  --   BUN 20 20 25  17   CREATININE 1.25* 1.3 1.29* 1.3  CALCIUM 8.5* 8.7 8.8 9.1  TSH  --  3.90 3.13  --    Liver Function Tests: Recent Labs    11/22/20 1129 12/26/20 0000 01/14/21 0843  AST 18 15 16   ALT 9 11 11   ALKPHOS 55 52  --   BILITOT 0.6  --  0.6  PROT 6.4*  --  6.2  ALBUMIN 3.3* 3.2*  --    No  results for input(s): LIPASE, AMYLASE in the last 8760 hours. No results for input(s): AMMONIA in the last 8760 hours. CBC: Recent Labs    12/26/20 0000 01/14/21 0843  WBC 9.8 5.9  NEUTROABS 6,831.00 2,921  HGB 13.8 13.6  HCT 40* 40.6  MCV  --  96.9  PLT 221 254   Lipid Panel: No results for input(s): CHOL, HDL, LDLCALC, TRIG, CHOLHDL, LDLDIRECT in the last 8760 hours. No results found for: HGBA1C  Procedures since last visit: No results found.  Assessment/Plan 1. Paroxysmal atrial fibrillation (HCC) On aspirin only Has Tachy brady . Refused PPM Sometimes HR goes down to 40. Stays Asymptomatic Not on any Anticoagulation due to age and falls 2. Primary hypertension BP running low  Change Micardis to 20 mg  3. Coronary artery disease involving native coronary artery of native heart without angina pectoris On Aspirin Off statin now  4. Stage 3 chronic kidney disease, unspecified whether stage 3a or 3b CKD (HCC) Creat stable on Lasix  5. Hypothyroidism,  TSH normal in 8/22  6. Insomnia, unspecified type On Ambien PRN  7. Urinary frequency stable  8 Constiptation Start on Colace   Labs/tests ordered:  * No order type specified *  Virgie Dad, MD

## 2021-07-21 NOTE — Progress Notes (Signed)
Location:   [facility] Room Number: 64 Place of Service:  SNF [phone]) Provider:  Veleta Miners MD  Mast, Man X, NP  Patient Care Team: Mast, Man X, NP as PCP - General (Internal Medicine) Belva Crome, MD as PCP - Cardiology (Cardiology)  Extended Emergency Contact Information Primary Emergency Contact: Carson Myrtle Address: [address]          [place], [place] of [place] Phone: [phone] Relation: Spouse Secondary Emergency Contact: Alan Ripper Address: [place], [place] [address] [place] of [place] Phone: [phone] Relation: Daughter  Code Status:  DNR Managed Care Goals of care: Advanced Directive information Advanced Directives 07/21/2021  Does Patient Have a Medical Advance Directive? Yes  Type of Advance Directive Living will;Out of facility DNR (pink MOST or yellow form)  Does patient want to make changes to medical advance directive? No - Patient declined  Copy of [place] in Chart? -  Would patient like information on creating a medical advance directive? -  Pre-existing out of facility DNR order (yellow form or pink MOST form) Pink MOST form placed in chart (order not valid for inpatient use);Yellow form placed in chart (order not valid for inpatient use)     No chief complaint on file.   HPI:  Pt is a 88 y.o. male seen today for medical management of chronic diseases.     Past Medical History:  Diagnosis Date   Atrial fibrillation ([facility])    CAD (coronary artery disease)    Diverticulosis    Hypercholesteremia    Hypertension    Hypothyroidism    Insomnia    Murmur    Paroxysmal atrial fibrillation (HCC)    Urinary dysfunction    Past Surgical History:  Procedure Laterality Date   BYPASS GRAFT     HEMORRHOID SURGERY     HERNIA REPAIR     HIP SURGERY      Allergies  Allergen Reactions   Shellfish Allergy Swelling and Rash    Allergies as of  07/21/2021       Reactions   Shellfish Allergy Swelling, Rash        Medication List        Accurate as of July 21, 2021  3:37 PM. If you have any questions, ask your nurse or doctor.          STOP taking these medications    Omega-3 1000 MG Caps Stopped by: Virgie Dad, MD       TAKE these medications    aspirin 81 MG EC tablet in the morning and at bedtime.   cholecalciferol 1000 units tablet Commonly known as: VITAMIN D Take 1,000 Units by mouth daily.   furosemide 40 MG tablet Commonly known as: LASIX Take 40 mg by mouth daily.   levothyroxine 25 MCG tablet Commonly known as: SYNTHROID Take 25 mcg by mouth daily before breakfast.   omega-3 acid ethyl esters 1 g capsule Commonly known as: LOVAZA Take 1 g by mouth 2 (two) times daily.   polyethylene glycol 17 g packet Commonly known as: MIRALAX / GLYCOLAX Take 17 g by mouth daily.   PRESERVISION AREDS 2 PO Take by mouth daily.   Therems-M Tabs Take 1 tablet by mouth daily.   saccharomyces boulardii 250 MG capsule Commonly known as: FLORASTOR Take 250 mg by mouth daily.   telmisartan 40 MG tablet Commonly known  as: MICARDIS Take 40 mg by mouth daily.   vitamin C 1000 MG tablet Take 1,000 mg by mouth in the morning and at bedtime.   zolpidem 5 MG tablet Commonly known as: AMBIEN TAKE ONE TABLET AT BEDTIME AS NEEDED FOR SLEEP.        Review of Systems  Immunization History  Administered Date(s) Administered   Influenza, High Dose Seasonal PF 04/11/2017, 03/26/2020   Influenza-Unspecified 03/26/2020   Moderna Sars-Covid-2 Vaccination 06/18/2019, 07/16/2019   Pneumococcal-Unspecified 08/12/2012   Tetanus 06/14/2004   Zoster Recombinat (Shingrix) 06/02/2017   Pertinent  Health Maintenance Due  Topic Date Due   INFLUENZA VACCINE  01/12/2021   Fall Risk 05/16/2020 08/12/2020 09/18/2020 11/22/2020  Falls in the past year? 0 0 0 -  Was there an injury with Fall? - - 0 -  Fall Risk  Category Calculator - - 0 -  Fall Risk Category - - Low -  Patient Fall Risk Level - - Low fall risk Low fall risk   Functional Status Survey:    Vitals:   07/21/21 1533  BP: (!) 101/57  Pulse: 80  Resp: 18  Temp: 97.9 F (36.6 C)  SpO2: 96%  Weight: 168 lb 12.8 oz (76.6 kg)  Height: 5\' 4"  (1.626 m)   Body mass index is 28.97 kg/m. Physical Exam  Labs reviewed: Recent Labs    11/22/20 1129 12/26/20 0000 01/14/21 0843 03/20/21 0000  NA 130* 130* 134* 135*  K 4.0 4.3 4.2 4.5  CL 99 97* 100 99  CO2 22 23* 27 27*  GLUCOSE 89  --  89  --   BUN 20 20 25 17   CREATININE 1.25* 1.3 1.29* 1.3  CALCIUM 8.5* 8.7 8.8 9.1   Recent Labs    11/22/20 1129 12/26/20 0000 01/14/21 0843  AST 18 15 16   ALT 9 11 11   ALKPHOS 55 52  --   BILITOT 0.6  --  0.6  PROT 6.4*  --  6.2  ALBUMIN 3.3* 3.2*  --    Recent Labs    12/26/20 0000 01/14/21 0843  WBC 9.8 5.9  NEUTROABS 6,831.00 2,921  HGB 13.8 13.6  HCT 40* 40.6  MCV  --  96.9  PLT 221 254   Lab Results  Component Value Date   TSH 3.13 01/14/2021   No results found for: HGBA1C Lab Results  Component Value Date   CHOL 183 05/05/2020   HDL 54 05/05/2020   LDLCALC 107 (H) 05/05/2020   TRIG 128 05/05/2020   CHOLHDL 3.4 05/05/2020    Significant Diagnostic Results in last 30 days:  No results found.  Assessment/Plan There are no diagnoses linked to this encounter.   Family/ staff Communication:   Labs/tests ordered:

## 2021-08-14 ENCOUNTER — Encounter: Payer: Self-pay | Admitting: Nurse Practitioner

## 2021-08-14 ENCOUNTER — Non-Acute Institutional Stay (SKILLED_NURSING_FACILITY): Payer: Medicare PPO | Admitting: Nurse Practitioner

## 2021-08-14 DIAGNOSIS — I2510 Atherosclerotic heart disease of native coronary artery without angina pectoris: Secondary | ICD-10-CM

## 2021-08-14 DIAGNOSIS — E039 Hypothyroidism, unspecified: Secondary | ICD-10-CM

## 2021-08-14 DIAGNOSIS — E785 Hyperlipidemia, unspecified: Secondary | ICD-10-CM

## 2021-08-14 DIAGNOSIS — G4700 Insomnia, unspecified: Secondary | ICD-10-CM | POA: Diagnosis not present

## 2021-08-14 DIAGNOSIS — R350 Frequency of micturition: Secondary | ICD-10-CM

## 2021-08-14 DIAGNOSIS — I10 Essential (primary) hypertension: Secondary | ICD-10-CM

## 2021-08-14 DIAGNOSIS — D6859 Other primary thrombophilia: Secondary | ICD-10-CM

## 2021-08-14 DIAGNOSIS — I480 Paroxysmal atrial fibrillation: Secondary | ICD-10-CM

## 2021-08-14 DIAGNOSIS — I270 Primary pulmonary hypertension: Secondary | ICD-10-CM

## 2021-08-14 DIAGNOSIS — N183 Chronic kidney disease, stage 3 unspecified: Secondary | ICD-10-CM | POA: Diagnosis not present

## 2021-08-14 DIAGNOSIS — K5901 Slow transit constipation: Secondary | ICD-10-CM

## 2021-08-14 DIAGNOSIS — Z8719 Personal history of other diseases of the digestive system: Secondary | ICD-10-CM

## 2021-08-14 NOTE — Progress Notes (Signed)
Location:   [facility] Room Number: 64 Place of Service:  SNF (31) Provider:  Marlana Latus NP  Ronn Smolinsky X, NP  Patient Care Team: Tiara Bartoli X, NP as PCP - General (Internal Medicine) Belva Crome, MD as PCP - Cardiology (Cardiology)  Extended Emergency Contact Information Primary Emergency Contact: Murakami,Jean Address: [address]          [place], [place] of [place] Phone: [phone] Relation: Spouse Secondary Emergency Contact: Alan Ripper Address: [place], [place] [address] [place] Phone: [phone] Relation: Daughter  Code Status:  DNR Managed Care Goals of care: Advanced Directive information Advanced Directives 08/14/2021  Does Patient Have a Medical Advance Directive? Yes  Type of Advance Directive Living will;Out of facility DNR (pink MOST or yellow form)  Does patient want to make changes to medical advance directive? No - Patient declined  Copy of [place] in Chart? -  Would patient like information on creating a medical advance directive? -  Pre-existing out of facility DNR order (yellow form or pink MOST form) Pink MOST form placed in chart (order not valid for inpatient use);Yellow form placed in chart (order not valid for inpatient use)     Chief Complaint  Patient presents with   Medical Management of Chronic Issues   Quality Metric Gaps    TDAP NCIR/Matrix verified    HPI:  Pt is a 88 y.o. male seen today for medical management of chronic diseases.      Afib, tachy brady, refused PPM, heart rate is in control. EKG 12/05/19 Afib at cardiology, only takes ASA, TSH 3.13 01/14/21.             Thrombophilia, had work up in the past, venous US negative DVT in legs.               Urinary frequency, no difference since off Mirabegran due to cost, had urinary retention, s/p ED eval 11/22/20, f/u urology             CAD, on ASA, Fish oil, Coronary artery  bypass grafting 1992             Hx of GI bleed, Hgb 13.6 01/14/21             CVA, no apparent focal weakness.             HTN, takes Telmisartan Pulmonary hypertension/edema BLE, takes Furosemide, [place] 6/18/21negative DVT Insomnia, takes Zolpidem             Hypothyroidism, takes Levothyroxine, TSH 3.13 01/14/21             CKD Bun/creat 27/1.3 10//7/22             Hyperlipidemia, LDL 107 05/05/20, on Omega 3 supplement             Constipation, takes MiraLax, Colace.     Past Medical History:  Diagnosis Date   Atrial fibrillation (HCC)    CAD (coronary artery disease)    Diverticulosis    Hypercholesteremia    Hypertension    Hypothyroidism    Insomnia    Murmur    Paroxysmal atrial fibrillation (HCC)    Urinary dysfunction    Past Surgical History:  Procedure Laterality Date   BYPASS GRAFT     HEMORRHOID SURGERY     HERNIA REPAIR     HIP  SURGERY      Allergies  Allergen Reactions   Shellfish Allergy Swelling and Rash    Allergies as of 08/14/2021       Reactions   Shellfish Allergy Swelling, Rash        Medication List        Accurate as of August 14, 2021 11:59 PM. If you have any questions, ask your nurse or doctor.          aspirin 81 MG EC tablet in the morning and at bedtime.   cholecalciferol 1000 units tablet Commonly known as: VITAMIN D Take 1,000 Units by mouth daily.   docusate sodium 100 MG capsule Commonly known as: COLACE Take 200 mg by mouth at bedtime.   furosemide 40 MG tablet Commonly known as: LASIX Take 40 mg by mouth daily.   levothyroxine 25 MCG tablet Commonly known as: SYNTHROID Take 25 mcg by mouth daily before breakfast.   omega-3 acid ethyl esters 1 g capsule Commonly known as: LOVAZA Take 1 g by mouth 2 (two) times daily.   polyethylene glycol 17 g packet Commonly known as: MIRALAX / GLYCOLAX Take 17 g by mouth daily.   PRESERVISION AREDS 2 PO Take by mouth daily.   Therems-M Tabs Take 1 tablet by mouth  daily.   saccharomyces boulardii 250 MG capsule Commonly known as: FLORASTOR Take 250 mg by mouth daily.   telmisartan 40 MG tablet Commonly known as: MICARDIS Take 20 mg by mouth daily.   vitamin C 1000 MG tablet Take 1,000 mg by mouth in the morning and at bedtime.   zolpidem 5 MG tablet Commonly known as: AMBIEN TAKE ONE TABLET AT BEDTIME AS NEEDED FOR SLEEP.        Review of Systems  Constitutional:  Positive for unexpected weight change. Negative for activity change, appetite change and fever.       About #4Ibs weight gained in the past month, but his weight #171Ibs in the past.   HENT:  Positive for hearing loss. Negative for congestion and voice change.   Respiratory:  Positive for shortness of breath. Negative for cough and wheezing.        Occasional nocturnal orthopnea at his baseline.   Cardiovascular:  Positive for leg swelling. Negative for chest pain and palpitations.  Gastrointestinal:  Negative for abdominal pain and constipation.  Genitourinary:  Positive for frequency. Negative for difficulty urinating and urgency.  Musculoskeletal:  Positive for arthralgias and gait problem.  Neurological:  Negative for speech difficulty, weakness and headaches.  Psychiatric/Behavioral:  Negative for confusion and sleep disturbance. The patient is not nervous/anxious.    Immunization History  Administered Date(s) Administered   Influenza, High Dose Seasonal PF 04/11/2017, 03/26/2020   Influenza-Unspecified 03/26/2020, 04/01/2021   Moderna SARS-COV2 Booster Vaccination 04/06/2020   Moderna Sars-Covid-2 Vaccination 06/18/2019, 07/16/2019   PFIZER(Purple Top)SARS-COV-2 Vaccination 09/14/2020, 03/03/2021   Pneumococcal-Unspecified 08/12/2012   Tetanus 06/14/2004   Zoster Recombinat (Shingrix) 02/03/2017, 06/02/2017   Pertinent  Health Maintenance Due  Topic Date Due   INFLUENZA VACCINE  Completed   Fall Risk 05/16/2020 08/12/2020 09/18/2020 11/22/2020  Falls in the past  year? 0 0 0 -  Was there an injury with Fall? - - 0 -  Fall Risk Category Calculator - - 0 -  Fall Risk Category - - Low -  Patient Fall Risk Level - - Low fall risk Low fall risk   Functional Status Survey:    Vitals:   08/14/21 0907  BP: 116/70  Pulse: 81  Resp: 18  Temp: 97.9 F (36.6 C)  SpO2: 95%  Weight: 172 lb (78 kg)  Height: '5\' 4"'  (1.626 m)   Body mass index is 29.52 kg/m. Physical Exam Vitals and nursing note reviewed.  Constitutional:      Appearance: Normal appearance.  HENT:     Head: Normocephalic and atraumatic.     Mouth/Throat:     Mouth: Mucous membranes are moist.  Eyes:     Extraocular Movements: Extraocular movements intact.     Conjunctiva/sclera: Conjunctivae normal.     Pupils: Pupils are equal, round, and reactive to light.     Comments: Left lower eyelid ectropion.   Cardiovascular:     Rate and Rhythm: Normal rate. Rhythm irregular.     Heart sounds: Murmur heard.  Pulmonary:     Effort: Pulmonary effort is normal.     Breath sounds: Rales present.     Comments: Bibasilar rales.  Abdominal:     General: Bowel sounds are normal.     Palpations: Abdomen is soft.     Tenderness: There is no abdominal tenderness.  Musculoskeletal:     Cervical back: Normal range of motion and neck supple.     Right lower leg: Edema present.     Left lower leg: Edema present.     Comments: 2+ edema BLE L>R, uses TED sometimes.  Skin:    General: Skin is warm and dry.  Neurological:     General: No focal deficit present.     Mental Status: He is alert and oriented to person, place, and time. Mental status is at baseline.     Motor: Weakness present.     Coordination: Coordination normal.     Gait: Gait abnormal.     Comments: Left sided weakness form previous CVA, muscle strength 5/5  Psychiatric:        Mood and Affect: Mood normal.        Behavior: Behavior normal.        Thought Content: Thought content normal.        Judgment: Judgment normal.     Labs reviewed: Recent Labs    11/22/20 1129 12/26/20 0000 01/14/21 0843 03/20/21 0000  NA 130* 130* 134* 135*  K 4.0 4.3 4.2 4.5  CL 99 97* 100 99  CO2 22 23* 27 27*  GLUCOSE 89  --  89  --   BUN '20 20 25 17  ' CREATININE 1.25* 1.3 1.29* 1.3  CALCIUM 8.5* 8.7 8.8 9.1   Recent Labs    11/22/20 1129 12/26/20 0000 01/14/21 0843  AST '18 15 16  ' ALT '9 11 11  ' ALKPHOS 55 52  --   BILITOT 0.6  --  0.6  PROT 6.4*  --  6.2  ALBUMIN 3.3* 3.2*  --    Recent Labs    12/26/20 0000 01/14/21 0843  WBC 9.8 5.9  NEUTROABS 6,831.00 2,921  HGB 13.8 13.6  HCT 40* 40.6  MCV  --  96.9  PLT 221 254   Lab Results  Component Value Date   TSH 3.13 01/14/2021   No results found for: HGBA1C Lab Results  Component Value Date   CHOL 183 05/05/2020   HDL 54 05/05/2020   LDLCALC 107 (H) 05/05/2020   TRIG 128 05/05/2020   CHOLHDL 3.4 05/05/2020    Significant Diagnostic Results in last 30 days:  No results found.  Assessment/Plan Pulmonary hypertension, primary ([place]) edema BLE, takes Furosemide, [place] 6/18/21negative DVT, update  CMP/eGFR  Insomnia Stable, continue Zolpidem.   Hypothyroidism Weight gained about #4Ibs in the past month, no new respiratory symptoms,  takes Levothyroxine, TSH 3.13 01/14/21, repeat TSH  CKD (chronic kidney disease) stage 3, GFR 30-59 ml/min (HCC) Bun/creat 27/1.3 10//7/22, update CMP/eGFR  Hyperlipidemia  LDL 107 05/05/20, on Omega 3 supplement  Slow transit constipation Stable,  takes MiraLax, Colace.     Hypertension Blood pressure is controlled, takes Telmisartan  History of GI bleed Hgb 13.6 01/14/21, repeat CBC/diff  CAD (coronary artery disease) Stable, on ASA, Fish oil, Coronary artery bypass grafting 1992  Urinary frequency no difference since off Mirabegran due to cost, had urinary retention, s/p ED eval 11/22/20, f/u urology  Thrombophilia ([place]) had work up in the past, venous US negative DVT in legs.   Paroxysmal atrial  fibrillation Tachy- brady, refused PPM, heart rate is in control. EKG 12/05/19 Afib at cardiology, only takes ASA, TSH 3.13 01/14/21.     Family/ staff Communication: plan of care reviewed with the patient and charge nurse.   Labs/tests ordered: CBC/diff, CMP/eGFR, TSH  Time spend 35 minutes.

## 2021-08-17 NOTE — Assessment & Plan Note (Signed)
Stable, takes MiraLax, Colace.  

## 2021-08-17 NOTE — Assessment & Plan Note (Addendum)
Weight gained about #4Ibs in the past month, no new respiratory symptoms,  takes Levothyroxine, TSH 3.13 01/14/21, repeat TSH ?

## 2021-08-17 NOTE — Assessment & Plan Note (Signed)
Blood pressure is controlled, takes Telmisartan ?

## 2021-08-17 NOTE — Assessment & Plan Note (Signed)
Bun/creat 27/1.3 10//7/22, update CMP/eGFR ?

## 2021-08-17 NOTE — Assessment & Plan Note (Signed)
edema BLE, takes Furosemide, [place] 6/18/21negative DVT, update CMP/eGFR ?

## 2021-08-17 NOTE — Assessment & Plan Note (Signed)
Hgb 13.6 01/14/21, repeat CBC/diff ?

## 2021-08-17 NOTE — Assessment & Plan Note (Signed)
Stable, continue Zolpidem.  ?

## 2021-08-17 NOTE — Assessment & Plan Note (Signed)
no difference since off Mirabegran due to cost, had urinary retention, s/p ED eval 11/22/20, f/u urology 

## 2021-08-17 NOTE — Assessment & Plan Note (Signed)
Tachy- brady, refused PPM, heart rate is in control. EKG 12/05/19 Afib at cardiology, only takes ASA, TSH 3.13 01/14/21. ?

## 2021-08-17 NOTE — Assessment & Plan Note (Signed)
LDL 107 05/05/20, on Omega 3 supplement 

## 2021-08-17 NOTE — Assessment & Plan Note (Signed)
Stable, on ASA, Fish oil, Coronary artery bypass grafting 1992 ?

## 2021-08-17 NOTE — Assessment & Plan Note (Signed)
had work up in the past, venous US negative DVT in legs.  

## 2021-08-18 ENCOUNTER — Encounter: Payer: Self-pay | Admitting: Nurse Practitioner

## 2021-08-19 LAB — HEPATIC FUNCTION PANEL
ALT: 12 U/L (ref 10–40)
AST: 16 (ref 14–40)
Alkaline Phosphatase: 71 (ref 25–125)
Bilirubin, Total: 0.5

## 2021-08-19 LAB — COMPREHENSIVE METABOLIC PANEL
Albumin: 3.4 — AB (ref 3.5–5.0)
Calcium: 8.9 (ref 8.7–10.7)
Globulin: 2.7
eGFR: 46

## 2021-08-19 LAB — BASIC METABOLIC PANEL
BUN: 25 — AB (ref 4–21)
CO2: 27 — AB (ref 13–22)
Chloride: 99 (ref 99–108)
Creatinine: 1.4 — AB (ref 0.6–1.3)
Glucose: 99
Potassium: 4.4 mEq/L (ref 3.5–5.1)
Sodium: 136 — AB (ref 137–147)

## 2021-08-19 LAB — CBC AND DIFFERENTIAL
HCT: 38 — AB (ref 41–53)
Hemoglobin: 13.1 — AB (ref 13.5–17.5)
Platelets: 207 10*3/uL (ref 150–400)
WBC: 5.2

## 2021-08-19 LAB — CBC: RBC: 3.97 (ref 3.87–5.11)

## 2021-09-25 ENCOUNTER — Non-Acute Institutional Stay (SKILLED_NURSING_FACILITY): Payer: Medicare PPO | Admitting: Nurse Practitioner

## 2021-09-25 ENCOUNTER — Encounter: Payer: Self-pay | Admitting: Nurse Practitioner

## 2021-09-25 DIAGNOSIS — I10 Essential (primary) hypertension: Secondary | ICD-10-CM

## 2021-09-25 DIAGNOSIS — Z8719 Personal history of other diseases of the digestive system: Secondary | ICD-10-CM

## 2021-09-25 DIAGNOSIS — K5901 Slow transit constipation: Secondary | ICD-10-CM

## 2021-09-25 DIAGNOSIS — E039 Hypothyroidism, unspecified: Secondary | ICD-10-CM | POA: Diagnosis not present

## 2021-09-25 DIAGNOSIS — N183 Chronic kidney disease, stage 3 unspecified: Secondary | ICD-10-CM

## 2021-09-25 DIAGNOSIS — I270 Primary pulmonary hypertension: Secondary | ICD-10-CM

## 2021-09-25 DIAGNOSIS — I2510 Atherosclerotic heart disease of native coronary artery without angina pectoris: Secondary | ICD-10-CM

## 2021-09-25 DIAGNOSIS — I483 Typical atrial flutter: Secondary | ICD-10-CM

## 2021-09-25 DIAGNOSIS — G4700 Insomnia, unspecified: Secondary | ICD-10-CM

## 2021-09-25 DIAGNOSIS — I6300 Cerebral infarction due to thrombosis of unspecified precerebral artery: Secondary | ICD-10-CM

## 2021-09-25 DIAGNOSIS — R350 Frequency of micturition: Secondary | ICD-10-CM

## 2021-09-25 DIAGNOSIS — E785 Hyperlipidemia, unspecified: Secondary | ICD-10-CM

## 2021-09-25 DIAGNOSIS — D6859 Other primary thrombophilia: Secondary | ICD-10-CM

## 2021-09-25 NOTE — Assessment & Plan Note (Signed)
LDL 107 05/05/20, on Omega 3 supplement 

## 2021-09-25 NOTE — Assessment & Plan Note (Signed)
Low Bp measurement, dc Telmisartan.  ?

## 2021-09-25 NOTE — Assessment & Plan Note (Signed)
takes Zolpidem.  ?

## 2021-09-25 NOTE — Assessment & Plan Note (Signed)
Bun/creat 25/1.4 08/19/21 ?

## 2021-09-25 NOTE — Assessment & Plan Note (Signed)
No reported angina, on ASA, Fish oil, Coronary artery bypass grafting 1992 ?

## 2021-09-25 NOTE — Assessment & Plan Note (Addendum)
Hx, slightly left sided weakness with muscle strength 5/5 ?

## 2021-09-25 NOTE — Assessment & Plan Note (Signed)
Urinary frequency, no difference since off Mirabegran due to cost, had urinary retention, s/p ED eval 11/22/20, f/u urology 

## 2021-09-25 NOTE — Assessment & Plan Note (Signed)
Afib, tachy brady, refused PPM, heart rate is in control. EKG 12/05/19 Afib at cardiology, only takes ASA, TSH 3.13 01/14/21. ?

## 2021-09-25 NOTE — Assessment & Plan Note (Signed)
Stable, takes MiraLax, Colace.  

## 2021-09-25 NOTE — Assessment & Plan Note (Signed)
takes Levothyroxine, TSH 3.13 01/14/21 ?

## 2021-09-25 NOTE — Assessment & Plan Note (Signed)
Stable, Hx of GI bleed, Hgb 13.1 08/19/21 ?

## 2021-09-25 NOTE — Assessment & Plan Note (Signed)
Thrombophilia, had work up in the past, venous US negative DVT in legs.  

## 2021-09-25 NOTE — Progress Notes (Signed)
?Location:   SNF [facility] ?  ?Place of Service:    ?Provider: Marlana Latus NP ? ?Latisa Belay X, NP ? ?Patient Care Team: ?Bowe Sidor X, NP as PCP - General (Internal Medicine) ?Belva Crome, MD as PCP - Cardiology (Cardiology) ? ?Extended Emergency Contact Information ?Primary Emergency Contact: Eccleston,Jean ?Address: [place], [place] ?Home Phone: [phone] ?Relation: Spouse ?Secondary Emergency Contact: GOFF, LINDA ?Address: [address] ?         [place], [place] [address] [place] ?Home Phone: [phone] ?Relation: Daughter ? ?Code Status:  DNR ?Goals of care: Advanced Directive information ? ?  09/25/2021  ?  9:46 AM  ?Advanced Directives  ?Does Patient Have a Medical Advance Directive? Yes  ?Type of Advance Directive Out of facility DNR (pink MOST or yellow form)  ?Does patient want to make changes to medical advance directive? No - Patient declined  ?Pre-existing out of facility DNR order (yellow form or pink MOST form) Yellow form placed in chart (order not valid for inpatient use);Pink MOST form placed in chart (order not valid for inpatient use)  ? ? ? ?Chief Complaint  ?Patient presents with  ? Medical Management of Chronic Issues  ?  Patient is here for routine chronic condition follow up ?  ? ? ?HPI:  ?Pt is a 88 y.o. male seen today for medical management of chronic diseases.   ?  ? ?  Afib, tachy brady, refused PPM, heart rate is in control. EKG 12/05/19 Afib at cardiology, only takes ASA, TSH 3.13 01/14/21. ?            Thrombophilia, had work up in the past, venous US negative DVT in legs.  ?             Urinary frequency, no difference since off Mirabegran due to cost, had urinary retention, s/p ED eval 11/22/20, f/u urology ?            CAD, on ASA, Fish oil, Coronary artery bypass grafting 1992 ?            Hx of GI bleed, Hgb 13.1 08/19/21 ?            CVA, slightly left sided weakness with muscle strength 5/5 ?            HTN, low Bp, off  Telmisartan ?Pulmonary hypertension/edema BLE, takes Furosemide, [place] 6/18/21negative DVT ?Insomnia, takes Zolpidem ?            Hypothyroidism, takes Levothyroxine, TSH 3.13 01/14/21 ?            CKD Bun/creat 25/1.4 08/19/21 ?            Hyperlipidemia, LDL 107 05/05/20, on Omega 3 supplement ?            Constipation, takes MiraLax, Colace.  ?Past Medical History:  ?Diagnosis Date  ? Atrial fibrillation ([place])   ? CAD (coronary artery disease)   ? Diverticulosis   ? Hypercholesteremia   ? Hypertension   ? Hypothyroidism   ? Insomnia   ? Murmur   ? Paroxysmal atrial fibrillation (HCC)   ? Urinary dysfunction   ? ?Past Surgical History:  ?Procedure Laterality Date  ? BYPASS GRAFT    ? HEMORRHOID SURGERY    ? HERNIA REPAIR    ? HIP SURGERY    ? ? ?Allergies  ?Allergen Reactions  ? Shellfish Allergy  Swelling and Rash  ? ? ?Allergies as of 09/25/2021   ? ?   Reactions  ? Shellfish Allergy Swelling, Rash  ? ?  ? ?  ?Medication List  ?  ? ?  ? Accurate as of September 25, 2021 11:59 PM. If you have any questions, ask your nurse or doctor.  ?  ?  ? ?  ? ?aspirin 81 MG EC tablet ?in the morning and at bedtime. ?  ?cholecalciferol 1000 units tablet ?Commonly known as: VITAMIN D ?Take 1,000 Units by mouth daily. ?  ?docusate sodium 100 MG capsule ?Commonly known as: COLACE ?Take 200 mg by mouth at bedtime. ?  ?furosemide 40 MG tablet ?Commonly known as: LASIX ?Take 40 mg by mouth daily. ?  ?levothyroxine 25 MCG tablet ?Commonly known as: SYNTHROID ?Take 25 mcg by mouth daily before breakfast. ?  ?omega-3 acid ethyl esters 1 g capsule ?Commonly known as: LOVAZA ?Take 1 g by mouth 2 (two) times daily. ?  ?polyethylene glycol 17 g packet ?Commonly known as: MIRALAX / GLYCOLAX ?Take 17 g by mouth daily. ?  ?PRESERVISION AREDS 2 PO ?Take by mouth daily. ?  ?Therems-M Tabs ?Take 1 tablet by mouth daily. ?  ?saccharomyces boulardii 250 MG capsule ?Commonly known as: FLORASTOR ?Take 250 mg by mouth daily. ?  ?telmisartan 40 MG tablet ?Commonly  known as: MICARDIS ?Take 20 mg by mouth daily. ?  ?vitamin C 1000 MG tablet ?Take 1,000 mg by mouth in the morning and at bedtime. ?  ?zolpidem 5 MG tablet ?Commonly known as: AMBIEN ?TAKE ONE TABLET AT BEDTIME AS NEEDED FOR SLEEP. ?  ? ?  ? ? ?Review of Systems  ?Constitutional:  Negative for activity change, appetite change and fever.  ?HENT:  Positive for hearing loss. Negative for congestion and voice change.   ?Respiratory:  Positive for shortness of breath. Negative for cough and wheezing.   ?     Occasional nocturnal orthopnea at his baseline.   ?Cardiovascular:  Positive for leg swelling. Negative for chest pain and palpitations.  ?Gastrointestinal:  Negative for abdominal pain and constipation.  ?Genitourinary:  Positive for frequency. Negative for difficulty urinating and urgency.  ?Musculoskeletal:  Positive for arthralgias and gait problem.  ?Neurological:  Negative for speech difficulty, weakness and headaches.  ?Psychiatric/Behavioral:  Negative for confusion and sleep disturbance. The patient is not nervous/anxious.   ? ?Immunization History  ?Administered Date(s) Administered  ? Influenza, High Dose Seasonal PF 04/11/2017, 03/26/2020  ? Influenza-Unspecified 03/26/2020, 04/01/2021  ? Moderna SARS-COV2 Booster Vaccination 04/06/2020  ? Moderna Sars-Covid-2 Vaccination 06/18/2019, 07/16/2019  ? PFIZER(Purple Top)SARS-COV-2 Vaccination 09/14/2020, 03/03/2021  ? Pneumococcal-Unspecified 08/12/2012  ? Tetanus 06/14/2004  ? Zoster Recombinat (Shingrix) 02/03/2017, 06/02/2017  ? ?Pertinent  Health Maintenance Due  ?Topic Date Due  ? INFLUENZA VACCINE  01/12/2022  ? ? ?  05/16/2020  ? 11:54 AM 08/12/2020  ?  1:19 PM 09/18/2020  ?  3:37 PM 11/22/2020  ? 10:54 AM  ?Fall Risk  ?Falls in the past year? 0 0 0   ?Was there an injury with Fall?   0   ?Fall Risk Category Calculator   0   ?Fall Risk Category   Low   ?Patient Fall Risk Level   Low fall risk Low fall risk  ? ?Functional Status Survey: ?  ? ?Vitals:  ?  09/25/21 0944  ?BP: 96/76  ?Pulse: 80  ?Resp: 18  ?Temp: 98 ?F (36.7 ?C)  ?SpO2: 93%  ?Weight: 174 lb  9.6 oz (79.2 kg)  ?Height: '5\' 4"'$  (1.626 m)  ? ?Body mass index is 29.97 kg/m?Marland Kitchen ?Physical Exam ?Vitals and nursing note reviewed.  ?Constitutional:   ?   Appearance: Normal appearance.  ?HENT:  ?   Head: Normocephalic and atraumatic.  ?   Mouth/Throat:  ?   Mouth: Mucous membranes are moist.  ?Eyes:  ?   Extraocular Movements: Extraocular movements intact.  ?   Conjunctiva/sclera: Conjunctivae normal.  ?   Pupils: Pupils are equal, round, and reactive to light.  ?   Comments: Left lower eyelid ectropion.   ?Cardiovascular:  ?   Rate and Rhythm: Normal rate. Rhythm irregular.  ?   Heart sounds: Murmur heard.  ?Pulmonary:  ?   Effort: Pulmonary effort is normal.  ?   Breath sounds: Rales present.  ?   Comments: Bibasilar rales.  ?Abdominal:  ?   General: Bowel sounds are normal.  ?   Palpations: Abdomen is soft.  ?   Tenderness: There is no abdominal tenderness.  ?Musculoskeletal:  ?   Cervical back: Normal range of motion and neck supple.  ?   Right lower leg: Edema present.  ?   Left lower leg: Edema present.  ?   Comments: 2+ edema BLE L>R, uses TED sometimes.  ?Skin: ?   General: Skin is warm and dry.  ?Neurological:  ?   General: No focal deficit present.  ?   Mental Status: He is alert and oriented to person, place, and time. Mental status is at baseline.  ?   Motor: Weakness present.  ?   Coordination: Coordination normal.  ?   Gait: Gait abnormal.  ?   Comments: Left sided weakness form previous CVA, muscle strength 5/5  ?Psychiatric:     ?   Mood and Affect: Mood normal.     ?   Behavior: Behavior normal.     ?   Thought Content: Thought content normal.     ?   Judgment: Judgment normal.  ? ? ?Labs reviewed: ?Recent Labs  ?  11/22/20 ?1129 12/26/20 ?0000 01/14/21 ?1594 03/20/21 ?0000 08/19/21 ?0134  ?NA 130*   < > 134* 135* 136*  ?K 4.0   < > 4.2 4.5 4.4  ?CL 99   < > 100 99 99  ?CO2 22   < > 27 27* 27*   ?GLUCOSE 89  --  89  --   --   ?BUN 20   < > 25 17 25*  ?CREATININE 1.25*   < > 1.29* 1.3 1.4*  ?CALCIUM 8.5*   < > 8.8 9.1 8.9  ? < > = values in this interval not displayed.  ? ?Recent Labs  ?  11/22/20 ?1129 07/1

## 2021-09-25 NOTE — Assessment & Plan Note (Signed)
Pulmonary hypertension/edema BLE, takes Furosemide, [place] 6/18/21negative DVT ?

## 2021-09-28 ENCOUNTER — Encounter: Payer: Self-pay | Admitting: Nurse Practitioner

## 2021-09-30 LAB — TSH: TSH: 2.08 (ref 0.41–5.90)

## 2021-10-27 ENCOUNTER — Non-Acute Institutional Stay (SKILLED_NURSING_FACILITY): Payer: Medicare PPO | Admitting: Internal Medicine

## 2021-10-27 ENCOUNTER — Encounter: Payer: Self-pay | Admitting: Internal Medicine

## 2021-10-27 DIAGNOSIS — I5032 Chronic diastolic (congestive) heart failure: Secondary | ICD-10-CM | POA: Diagnosis not present

## 2021-10-27 DIAGNOSIS — I480 Paroxysmal atrial fibrillation: Secondary | ICD-10-CM

## 2021-10-27 DIAGNOSIS — R600 Localized edema: Secondary | ICD-10-CM

## 2021-10-27 DIAGNOSIS — I2510 Atherosclerotic heart disease of native coronary artery without angina pectoris: Secondary | ICD-10-CM

## 2021-10-27 DIAGNOSIS — N183 Chronic kidney disease, stage 3 unspecified: Secondary | ICD-10-CM

## 2021-10-27 DIAGNOSIS — J0100 Acute maxillary sinusitis, unspecified: Secondary | ICD-10-CM | POA: Diagnosis not present

## 2021-10-27 DIAGNOSIS — E039 Hypothyroidism, unspecified: Secondary | ICD-10-CM

## 2021-10-27 NOTE — Progress Notes (Signed)
Location:   [facility] Room Number: 64 Place of Service:  SNF [phone]) Provider:  Veleta Miners MD  Mast, Man X, NP  Patient Care Team: Mast, Man X, NP as PCP - General (Internal Medicine) Belva Crome, MD as PCP - Cardiology (Cardiology)  Extended Emergency Contact Information Primary Emergency Contact: Carson Myrtle Address: [address]          [place], [place] Phone: [phone] Relation: Spouse Secondary Emergency Contact: Alan Ripper Address: [place], [place] [address] [place] Phone: [phone] Relation: Daughter  Code Status:  DNR Managed Care Goals of care: Advanced Directive information    10/27/2021    2:08 PM  Advanced Directives  Does Patient Have a Medical Advance Directive? Yes  Type of Advance Directive Out of facility DNR (pink MOST or yellow form)  Does patient want to make changes to medical advance directive? No - Patient declined  Pre-existing out of facility DNR order (yellow form or pink MOST form) Yellow form placed in chart (order not valid for inpatient use);Pink MOST form placed in chart (order not valid for inpatient use)     Chief Complaint  Patient presents with   Acute Visit    HPI:  Pt is a 88 y.o. male seen today for an acute visit for SOB,Left side of Face swollen and Skin lesion Lives in SNF  Noticed to have swelling in his Left side of face Also c/o difficulty in chewing food Coughing Nose runny with thick mucus. Possible pain in his teeth No Fever  Also C/o PND and Sob  Worsening swelling in his legs  He also has small skin lesion in Right side of his Forehead No itchy or tender  Other issues  Has h/o PAF Continues to have issues with tachybradycardia. Has refused pacemaker. Not on any anticoagulation. Not on any rate slowing medicines follows with cardiology   Lower extremity edema due to diastolic HF On Low dose of Lasix  and uses Ted hose Does not like high doses due to Urinary Frequency   Also has H/o Constipation, Hypothyroidism,h/o CVA CAD ,CKD Hypertension Insomnia and Urinary Frequency   Wt Readings from Last 3 Encounters:  10/27/21 174 lb 14.4 oz (79.3 kg)  09/25/21 174 lb 9.6 oz (79.2 kg)  08/14/21 172 lb (78 kg)    Past Medical History:  Diagnosis Date   Atrial fibrillation (HCC)    CAD (coronary artery disease)    Diverticulosis    Hypercholesteremia    Hypertension    Hypothyroidism    Insomnia    Murmur    Paroxysmal atrial fibrillation ([facility])    Urinary dysfunction    Past Surgical History:  Procedure Laterality Date   BYPASS GRAFT     HEMORRHOID SURGERY     HERNIA REPAIR     HIP SURGERY      Allergies  Allergen Reactions   Shellfish Allergy Swelling and Rash    Allergies as of 10/27/2021       Reactions   Shellfish Allergy Swelling, Rash        Medication List        Accurate as of Oct 27, 2021  2:09 PM. If you have any questions, ask your nurse or doctor.          STOP taking these medications    levothyroxine 25 MCG tablet Commonly known as: SYNTHROID Stopped  by: Virgie Dad, MD   telmisartan 40 MG tablet Commonly known as: MICARDIS Stopped by: Virgie Dad, MD       TAKE these medications    aspirin 81 MG EC tablet in the morning and at bedtime.   cholecalciferol 1000 units tablet Commonly known as: VITAMIN D Take 1,000 Units by mouth daily.   docusate sodium 100 MG capsule Commonly known as: COLACE Take 200 mg by mouth at bedtime.   furosemide 40 MG tablet Commonly known as: LASIX Take 40 mg by mouth daily.   omega-3 acid ethyl esters 1 g capsule Commonly known as: LOVAZA Take 1 g by mouth 2 (two) times daily.   polyethylene glycol 17 g packet Commonly known as: MIRALAX / GLYCOLAX Take 17 g by mouth daily.   PRESERVISION AREDS 2 PO Take by mouth daily.   Therems-M Tabs Take 1 tablet by mouth daily.   saccharomyces  boulardii 250 MG capsule Commonly known as: FLORASTOR Take 250 mg by mouth daily.   vitamin C 1000 MG tablet Take 1,000 mg by mouth in the morning and at bedtime.   zolpidem 5 MG tablet Commonly known as: AMBIEN TAKE ONE TABLET AT BEDTIME AS NEEDED FOR SLEEP.        Review of Systems  Constitutional:  Positive for activity change. Negative for appetite change and unexpected weight change.  HENT:  Positive for facial swelling, postnasal drip, rhinorrhea, sinus pressure and sinus pain.   Respiratory:  Positive for cough and shortness of breath.   Cardiovascular:  Positive for leg swelling.  Gastrointestinal:  Positive for constipation.  Genitourinary:  Negative for frequency.  Musculoskeletal:  Positive for gait problem. Negative for arthralgias and myalgias.  Skin:  Positive for color change. Negative for rash.  Neurological:  Negative for dizziness and weakness.  Psychiatric/Behavioral:  Negative for confusion and sleep disturbance.   All other systems reviewed and are negative.  Immunization History  Administered Date(s) Administered   Influenza, High Dose Seasonal PF 04/11/2017, 03/26/2020   Influenza-Unspecified 03/26/2020, 04/01/2021   Moderna SARS-COV2 Booster Vaccination 04/06/2020   Moderna Sars-Covid-2 Vaccination 06/18/2019, 07/16/2019   PFIZER(Purple Top)SARS-COV-2 Vaccination 09/14/2020, 03/03/2021   Pneumococcal-Unspecified 08/12/2012   Tetanus 06/14/2004   Zoster Recombinat (Shingrix) 02/03/2017, 06/02/2017   Pertinent  Health Maintenance Due  Topic Date Due   INFLUENZA VACCINE  01/12/2022      05/16/2020   11:54 AM 08/12/2020    1:19 PM 09/18/2020    3:37 PM 11/22/2020   10:54 AM  Fall Risk  Falls in the past year? 0 0 0   Was there an injury with Fall?   0   Fall Risk Category Calculator   0   Fall Risk Category   Low   Patient Fall Risk Level   Low fall risk Low fall risk   Functional Status Survey:    Vitals:   10/27/21 1401  BP: 110/72  Pulse:  84  Resp: 18  Temp: 99.2 F (37.3 C)  SpO2: 93%  Weight: 174 lb 14.4 oz (79.3 kg)  Height: '5\' 4"'$  (1.626 m)   Body mass index is 30.02 kg/m. Physical Exam Vitals reviewed.  Constitutional:      Appearance: Normal appearance.  HENT:     Head: Normocephalic.     Nose: Nose normal.     Mouth/Throat:     Mouth: Mucous membranes are moist.     Pharynx: Oropharynx is clear.     Comments: C/o Pain in his left  Back Teeth Sinus tenderness also present in left Side Eyes:     Pupils: Pupils are equal, round, and reactive to light.  Cardiovascular:     Rate and Rhythm: Normal rate and regular rhythm.     Pulses: Normal pulses.     Heart sounds: Murmur heard.  Pulmonary:     Effort: Pulmonary effort is normal. No respiratory distress.     Breath sounds: Normal breath sounds. No rales.  Abdominal:     General: Abdomen is flat. Bowel sounds are normal.     Palpations: Abdomen is soft.  Musculoskeletal:        General: Swelling present.     Cervical back: Neck supple.  Skin:    General: Skin is warm.     Comments: Small lesion in Left side of forehead ? keratosis  Neurological:     General: No focal deficit present.     Mental Status: He is alert and oriented to person, place, and time.  Psychiatric:        Mood and Affect: Mood normal.        Thought Content: Thought content normal.    Labs reviewed: Recent Labs    11/22/20 1129 12/26/20 0000 01/14/21 0843 03/20/21 0000 08/19/21 0134  NA 130*   < > 134* 135* 136*  K 4.0   < > 4.2 4.5 4.4  CL 99   < > 100 99 99  CO2 22   < > 27 27* 27*  GLUCOSE 89  --  89  --   --   BUN 20   < > 25 17 25*  CREATININE 1.25*   < > 1.29* 1.3 1.4*  CALCIUM 8.5*   < > 8.8 9.1 8.9   < > = values in this interval not displayed.   Recent Labs    11/22/20 1129 12/26/20 0000 01/14/21 0843 08/19/21 0134  AST '18 15 16 16  '$ ALT '9 11 11 12  '$ ALKPHOS 55 52  --  71  BILITOT 0.6  --  0.6  --   PROT 6.4*  --  6.2  --   ALBUMIN 3.3* 3.2*  --   3.4*   Recent Labs    12/26/20 0000 01/14/21 0843 08/19/21 0134  WBC 9.8 5.9 5.2  NEUTROABS 6,831.00 2,921  --   HGB 13.8 13.6 13.1*  HCT 40* 40.6 38*  MCV  --  96.9  --   PLT 221 254 207   Lab Results  Component Value Date   TSH 2.08 09/30/2021   No results found for: HGBA1C Lab Results  Component Value Date   CHOL 183 05/05/2020   HDL 54 05/05/2020   LDLCALC 107 (H) 05/05/2020   TRIG 128 05/05/2020   CHOLHDL 3.4 05/05/2020    Significant Diagnostic Results in last 30 days:  No results found.  Assessment/Plan 1. Acute non-recurrent maxillary sinusitis Augmentin 500 mg TID for 7 days Also referal to [place]  2. Chronic diastolic congestive heart failure (HCC) Chest Xray Stat Addendum Chest Xray negative for any CHF  Discontinue Lasix Started on torsemide 20 mg qd  3. Bilateral leg edema Torsemide 20 mg  4. Paroxysmal atrial fibrillation ([facility]CC) On aspirin only Has Tachy brady . Refused PPM Sometimes HR goes down to 40. Stays Asymptomatic Not on any Anticoagulation due to age and falls  5. Coronary artery disease involving native coronary artery of native heart without angina pectoris On aspirin  Off statin now  6. Stage 3 chronic kidney  disease, unspecified whether stage 3a or 3b CKD ([facility]) Repeat BMP  7. Hypothyroidism, unspecified type TSH normal in 04/23  8 Hypertension Off all his meds 9 Insomnia On Ambien Skin Lesion Referral to [facility] staff Communication:   Labs/tests ordered:

## 2021-11-02 ENCOUNTER — Other Ambulatory Visit: Payer: Self-pay | Admitting: Adult Health

## 2021-11-02 DIAGNOSIS — G4700 Insomnia, unspecified: Secondary | ICD-10-CM

## 2021-11-02 MED ORDER — ZOLPIDEM TARTRATE 5 MG PO TABS: ORAL_TABLET | ORAL | 0 refills | Status: DC

## 2021-11-05 LAB — BASIC METABOLIC PANEL
BUN: 27 — AB (ref 4–21)
CO2: 29 — AB (ref 13–22)
Chloride: 103 (ref 99–108)
Creatinine: 1.4 — AB (ref 0.6–1.3)
Glucose: 98
Potassium: 3.5 mEq/L (ref 3.5–5.1)
Sodium: 142 (ref 137–147)

## 2021-11-05 LAB — COMPREHENSIVE METABOLIC PANEL: Calcium: 8.9 (ref 8.7–10.7)

## 2021-11-19 ENCOUNTER — Non-Acute Institutional Stay (SKILLED_NURSING_FACILITY): Payer: Medicare PPO | Admitting: Adult Health

## 2021-11-19 ENCOUNTER — Encounter: Payer: Self-pay | Admitting: Adult Health

## 2021-11-19 DIAGNOSIS — I5032 Chronic diastolic (congestive) heart failure: Secondary | ICD-10-CM

## 2021-11-19 DIAGNOSIS — N475 Adhesions of prepuce and glans penis: Secondary | ICD-10-CM

## 2021-11-19 DIAGNOSIS — G4700 Insomnia, unspecified: Secondary | ICD-10-CM

## 2021-11-19 DIAGNOSIS — N183 Chronic kidney disease, stage 3 unspecified: Secondary | ICD-10-CM | POA: Diagnosis not present

## 2021-11-19 NOTE — Progress Notes (Signed)
Location:  [facility] Room Number: 64-A Place of Service:  SNF (31) Provider:  Durenda Age, DNP, FNP-BC  Patient Care Team: Mast, Man X, NP as PCP - General (Internal Medicine) Belva Crome, MD as PCP - Cardiology (Cardiology)  Extended Emergency Contact Information Primary Emergency Contact: Carson Myrtle Address: [address]          [place], [place] Phone: [phone] Relation: Spouse Secondary Emergency Contact: Alan Ripper Address: [place], [place] [address] [place] Phone: [phone] Relation: Daughter  Code Status:  DNR  Goals of care: Advanced Directive information    11/19/2021    3:12 PM  Advanced Directives  Does Patient Have a Medical Advance Directive? Yes  Type of Advance Directive Living will;Out of facility DNR (pink MOST or yellow form)  Does patient want to make changes to medical advance directive? No - Patient declined     Chief Complaint  Patient presents with   Medical Management of Chronic Issues    Routine Visit.    HPI:  Pt is a 88 y.o. male seen today for medical management of chronic diseases. He is a long-term care resident of [facility].  Adhesions of foreskin  -  He complained about his urine not going in the right direction and that he wets his clothes or the floor when he  urinates. He was noted to have adhesions on his foreskin. He denies pain and no redness or drainage noted   Chronic diastolic congestive heart failure (HCC)  no SOB, takes torsemide 20 mg 1 tab daily  Stage 3 chronic kidney disease, unspecified whether stage 3a or 3b CKD (HCC)  -  GFR 46, stable  Insomnia, unspecified type -  takes Ambien 5 mg 1 tab daily    Past Medical History:  Diagnosis Date   Atrial fibrillation (HCC)    CAD (coronary artery disease)    Diverticulosis    Hypercholesteremia    Hypertension    Hypothyroidism     Insomnia    Murmur    Paroxysmal atrial fibrillation (HCC)    Urinary dysfunction    Past Surgical History:  Procedure Laterality Date   BYPASS GRAFT     HEMORRHOID SURGERY     HERNIA REPAIR     HIP SURGERY      Allergies  Allergen Reactions   Shellfish Allergy Swelling and Rash    Outpatient Encounter Medications as of 11/19/2021  Medication Sig   Ascorbic Acid (VITAMIN C) 1000 MG tablet Take 1,000 mg by mouth in the morning and at bedtime.   aspirin 81 MG EC tablet in the morning and at bedtime.    cholecalciferol (VITAMIN D) 1000 UNITS tablet Take 1,000 Units by mouth daily.   docusate sodium (COLACE) 100 MG capsule Take 200 mg by mouth at bedtime.   levothyroxine (SYNTHROID) 50 MCG tablet Take 50 mcg by mouth daily before breakfast.   Multiple Vitamins-Minerals (PRESERVISION AREDS 2 PO) Take by mouth daily.   Multiple Vitamins-Minerals (THEREMS-M) TABS Take 1 tablet by mouth daily.   omega-3 acid ethyl esters (LOVAZA) 1 g capsule Take 1 g by mouth 2 (two) times daily.   Polyethyl Glycol-Propyl Glycol (SYSTANE) 0.4-0.3 % SOLN Place 2 drops into the left eye at bedtime.   polyethylene glycol (MIRALAX / GLYCOLAX) 17 g packet Take 17 g by mouth daily.   polyethylene  glycol (MIRALAX) 17 g packet Take 17 g by mouth daily as needed.   potassium chloride (MICRO-K) 10 MEQ CR capsule Take 10 mEq by mouth daily.   saccharomyces boulardii (FLORASTOR) 250 MG capsule Take 250 mg by mouth daily.   torsemide (DEMADEX) 20 MG tablet Take 20 mg by mouth daily.   white petrolatum (VASELINE) GEL Apply 1 application. topically 2 (two) times daily.   zolpidem (AMBIEN) 5 MG tablet TAKE ONE TABLET AT BEDTIME AS NEEDED FOR SLEEP.   furosemide (LASIX) 40 MG tablet Take 40 mg by mouth daily.   No facility-administered encounter medications on file as of 11/19/2021.    Review of Systems  Constitutional:  Negative for activity change, appetite change and fever.  HENT:  Negative for sore throat.   Eyes:  Negative.   Cardiovascular:  Positive for leg swelling. Negative for chest pain.  Gastrointestinal:  Negative for abdominal distention, diarrhea and vomiting.  Genitourinary:  Negative for dysuria, frequency, penile discharge, penile pain, penile swelling, scrotal swelling and urgency.  Skin:  Negative for color change.  Neurological:  Negative for dizziness and headaches.  Psychiatric/Behavioral:  Negative for behavioral problems and sleep disturbance. The patient is not nervous/anxious.        Immunization History  Administered Date(s) Administered   Influenza, High Dose Seasonal PF 04/11/2017, 03/26/2020   Influenza-Unspecified 03/26/2020, 04/01/2021   Moderna SARS-COV2 Booster Vaccination 04/06/2020   Moderna Sars-Covid-2 Vaccination 06/18/2019, 07/16/2019, 10/30/2021   PFIZER(Purple Top)SARS-COV-2 Vaccination 09/14/2020, 03/03/2021   Pneumococcal-Unspecified 08/12/2012   Tetanus 06/14/2004   Zoster Recombinat (Shingrix) 02/03/2017, 06/02/2017   Pertinent  Health Maintenance Due  Topic Date Due   INFLUENZA VACCINE  01/12/2022      05/16/2020   11:54 AM 08/12/2020    1:19 PM 09/18/2020    3:37 PM 11/22/2020   10:54 AM  Fall Risk  Falls in the past year? 0 0 0   Was there an injury with Fall?   0   Fall Risk Category Calculator   0   Fall Risk Category   Low   Patient Fall Risk Level   Low fall risk Low fall risk     Vitals:   11/19/21 1500  BP: 120/80  Pulse: 60  Resp: 17  Temp: 98 F (36.7 C)  SpO2: 96%  Weight: 166 lb 9.6 oz (75.6 kg)  Height: '5\' 4"'  (1.626 m)   Body mass index is 28.6 kg/m.  Physical Exam Constitutional:      Appearance: Normal appearance.  HENT:     Head: Normocephalic and atraumatic.     Mouth/Throat:     Mouth: Mucous membranes are moist.  Eyes:     Conjunctiva/sclera: Conjunctivae normal.  Cardiovascular:     Rate and Rhythm: Normal rate and regular rhythm.     Pulses: Normal pulses.     Heart sounds: Normal heart sounds.   Pulmonary:     Effort: Pulmonary effort is normal.     Breath sounds: Normal breath sounds.  Abdominal:     General: Bowel sounds are normal.     Palpations: Abdomen is soft.  Genitourinary:    Comments: Foreskin adhesion noted. Musculoskeletal:        General: Swelling present. Normal range of motion.     Cervical back: Normal range of motion.     Right lower leg: Edema present.     Left lower leg: Edema present.     Comments: BLE 2+edema.  Skin:    General: Skin is  warm and dry.  Neurological:     General: No focal deficit present.     Mental Status: He is alert and oriented to person, place, and time.  Psychiatric:        Mood and Affect: Mood normal.        Behavior: Behavior normal.        Thought Content: Thought content normal.        Judgment: Judgment normal.        Labs reviewed: Recent Labs    01/14/21 0843 03/20/21 0000 08/19/21 0134 11/05/21 0000  NA 134* 135* 136* 142  K 4.2 4.5 4.4 3.5  CL 100 99 99 103  CO2 27 27* 27* 29*  GLUCOSE 89  --   --   --   BUN 25 17 25* 27*  CREATININE 1.29* 1.3 1.4* 1.4*  CALCIUM 8.8 9.1 8.9 8.9   Recent Labs    12/26/20 0000 01/14/21 0843 08/19/21 0134  AST '15 16 16  ' ALT '11 11 12  ' ALKPHOS 52  --  71  BILITOT  --  0.6  --   PROT  --  6.2  --   ALBUMIN 3.2*  --  3.4*   Recent Labs    12/26/20 0000 01/14/21 0843 08/19/21 0134  WBC 9.8 5.9 5.2  NEUTROABS 6,831.00 2,921  --   HGB 13.8 13.6 13.1*  HCT 40* 40.6 38*  MCV  --  96.9  --   PLT 221 254 207   Lab Results  Component Value Date   TSH 2.08 09/30/2021   No results found for: "HGBA1C" Lab Results  Component Value Date   CHOL 183 05/05/2020   HDL 54 05/05/2020   LDLCALC 107 (H) 05/05/2020   TRIG 128 05/05/2020   CHOLHDL 3.4 05/05/2020    Significant Diagnostic Results in last 30 days:  No results found.  Assessment/Plan  1. Adhesions of foreskin -  will refer to urology -   keep area clean, monitor for redness, drainage and  irritation  2. Chronic diastolic congestive heart failure (HCC) -  no SOB, stable -  continue Torsemide  3. Stage 3 chronic kidney disease, unspecified whether stage 3a or 3b CKD (HCC) Lab Results  Component Value Date   NA 142 11/05/2021   K 3.5 11/05/2021   CO2 29 (A) 11/05/2021   GLUCOSE 89 01/14/2021   BUN 27 (A) 11/05/2021   CREATININE 1.4 (A) 11/05/2021   CALCIUM 8.9 11/05/2021   EGFR 46 08/19/2021   GFRNONAA 52 (L) 11/22/2020   -  stable  4. Insomnia, unspecified type -   stable, continue Ambien    Family/ staff Communication: Discussed plan of care with resident and charge nurse.  Labs/tests ordered:   None    Durenda Age, DNP, MSN, FNP-BC [facility] [phone] (Monday-Friday 8:00 a.m. - 5:00 p.m.) [phone] (after hours)

## 2021-12-02 ENCOUNTER — Other Ambulatory Visit: Payer: Self-pay | Admitting: Adult Health

## 2021-12-02 DIAGNOSIS — G4700 Insomnia, unspecified: Secondary | ICD-10-CM

## 2021-12-02 MED ORDER — ZOLPIDEM TARTRATE 5 MG PO TABS: ORAL_TABLET | ORAL | 0 refills | Status: DC

## 2021-12-25 ENCOUNTER — Non-Acute Institutional Stay (SKILLED_NURSING_FACILITY): Payer: Medicare PPO | Admitting: Adult Health

## 2021-12-25 ENCOUNTER — Encounter: Payer: Self-pay | Admitting: Adult Health

## 2021-12-25 DIAGNOSIS — I5032 Chronic diastolic (congestive) heart failure: Secondary | ICD-10-CM | POA: Diagnosis not present

## 2021-12-25 DIAGNOSIS — N1831 Chronic kidney disease, stage 3a: Secondary | ICD-10-CM

## 2021-12-25 DIAGNOSIS — N471 Phimosis: Secondary | ICD-10-CM | POA: Diagnosis not present

## 2021-12-25 DIAGNOSIS — G4700 Insomnia, unspecified: Secondary | ICD-10-CM

## 2021-12-25 DIAGNOSIS — E039 Hypothyroidism, unspecified: Secondary | ICD-10-CM | POA: Diagnosis not present

## 2021-12-25 NOTE — Progress Notes (Signed)
Location:  [facility] Room Number: 64-A Place of Service:  SNF (31) Provider:  Durenda Age, DNP, FNP-BC  Patient Care Team: Mast, Man X, NP as PCP - General (Internal Medicine) Belva Crome, MD as PCP - Cardiology (Cardiology)  Extended Emergency Contact Information Primary Emergency Contact: Carson Myrtle Address: [address]          [place], [place] Phone: [phone] Relation: Spouse Secondary Emergency Contact: Alan Ripper Address: [place], [place] [address] [place] Phone: [phone] Relation: Daughter  Code Status:  DNR  Goals of care: Advanced Directive information    12/25/2021   11:58 AM  Advanced Directives  Does Patient Have a Medical Advance Directive? Yes  Type of Advance Directive Living will;Out of facility DNR (pink MOST or yellow form)  Does patient want to make changes to medical advance directive? No - Patient declined     Chief Complaint  Patient presents with   Medical Management of Chronic Issues    Routine Visit.    HPI:  Pt is a 88 y.o. male seen today for medical management of chronic diseases. He is a long-term care resident of [facility].  Chronic diastolic congestive heart failure (HCC) -  no SOB, takes Torsemide 20 mg daily  Phimosis -follows up with urology who plans to do a possible dorsal slit  Stage 3a chronic kidney disease (HCC) -  GFR 45, creatinine 1.40 and BUN 27  Hypothyroidism, unspecified type  -  tsh 2.08, takes Synthroid 50 mcg daily  Insomnia, unspecified type  -verbalized "sleeping well at night",  takes Ambien 5 mg at bedtime    Past Medical History:  Diagnosis Date   Atrial fibrillation (HCC)    CAD (coronary artery disease)    Diverticulosis    Hypercholesteremia    Hypertension    Hypothyroidism    Insomnia    Murmur    Paroxysmal atrial fibrillation (HCC)    Urinary dysfunction     Past Surgical History:  Procedure Laterality Date   BYPASS GRAFT     HEMORRHOID SURGERY     HERNIA REPAIR     HIP SURGERY      Allergies  Allergen Reactions   Cranberry Extract Cough   Shellfish Allergy Swelling and Rash    Outpatient Encounter Medications as of 12/25/2021  Medication Sig   Ascorbic Acid (VITAMIN C) 1000 MG tablet Take 1,000 mg by mouth in the morning and at bedtime.   aspirin 81 MG EC tablet in the morning and at bedtime.    cholecalciferol (VITAMIN D) 1000 UNITS tablet Take 1,000 Units by mouth daily.   docusate sodium (COLACE) 100 MG capsule Take 200 mg by mouth at bedtime.   levothyroxine (SYNTHROID) 50 MCG tablet Take 50 mcg by mouth daily before breakfast.   Multiple Vitamins-Minerals (PRESERVISION AREDS 2 PO) Take by mouth daily.   Multiple Vitamins-Minerals (THEREMS-M) TABS Take 1 tablet by mouth daily.   omega-3 acid ethyl esters (LOVAZA) 1 g capsule Take 1 g by mouth 2 (two) times daily.   Polyethyl Glycol-Propyl Glycol (SYSTANE) 0.4-0.3 % SOLN Place 2 drops into the left eye at bedtime.   polyethylene glycol (MIRALAX / GLYCOLAX) 17 g packet Take 17 g by mouth daily.   polyethylene glycol (MIRALAX) 17 g packet Take 17 g by mouth daily as needed.   potassium chloride (MICRO-K) 10  MEQ CR capsule Take 10 mEq by mouth daily.   saccharomyces boulardii (FLORASTOR) 250 MG capsule Take 250 mg by mouth daily.   torsemide (DEMADEX) 20 MG tablet Take 20 mg by mouth daily.   [DISCONTINUED] zolpidem (AMBIEN) 5 MG tablet TAKE ONE TABLET AT BEDTIME AS NEEDED FOR SLEEP.   [DISCONTINUED] furosemide (LASIX) 40 MG tablet Take 40 mg by mouth daily.   [DISCONTINUED] white petrolatum (VASELINE) GEL Apply 1 application. topically 2 (two) times daily.   No facility-administered encounter medications on file as of 12/25/2021.    Review of Systems  Constitutional:  Negative for activity change, appetite change and fever.  HENT:  Negative for sore throat.   Eyes: Negative.    Cardiovascular:  Negative for chest pain and leg swelling.  Gastrointestinal:  Negative for abdominal distention, diarrhea and vomiting.  Genitourinary:  Negative for dysuria, frequency, penile pain and urgency.  Skin:  Negative for color change.  Neurological:  Negative for dizziness and headaches.  Psychiatric/Behavioral:  Negative for behavioral problems and sleep disturbance. The patient is not nervous/anxious.      Immunization History  Administered Date(s) Administered   Influenza, High Dose Seasonal PF 04/11/2017, 03/26/2020   Influenza-Unspecified 03/26/2020, 04/01/2021   Moderna SARS-COV2 Booster Vaccination 04/06/2020   Moderna Sars-Covid-2 Vaccination 06/18/2019, 07/16/2019, 10/30/2021   PFIZER(Purple Top)SARS-COV-2 Vaccination 09/14/2020, 03/03/2021   Pneumococcal-Unspecified 08/12/2012   Tetanus 06/14/2004   Zoster Recombinat (Shingrix) 02/03/2017, 06/02/2017   Pertinent  Health Maintenance Due  Topic Date Due   INFLUENZA VACCINE  01/12/2022      05/16/2020   11:54 AM 08/12/2020    1:19 PM 09/18/2020    3:37 PM 11/22/2020   10:54 AM  Fall Risk  Falls in the past year? 0 0 0   Was there an injury with Fall?   0   Fall Risk Category Calculator   0   Fall Risk Category   Low   Patient Fall Risk Level   Low fall risk Low fall risk     Vitals:   12/25/21 1151  BP: 134/72  Pulse: 82  Resp: 17  Temp: (!) 97 F (36.1 C)  SpO2: 96%  Weight: 167 lb (75.8 kg)  Height: '5\' 4"'  (1.626 m)   Body mass index is 28.67 kg/m.  Physical Exam Constitutional:      General: He is not in acute distress.    Appearance: Normal appearance.  HENT:     Head: Normocephalic and atraumatic.     Mouth/Throat:     Mouth: Mucous membranes are moist.  Eyes:     Conjunctiva/sclera: Conjunctivae normal.  Cardiovascular:     Rate and Rhythm: Normal rate and regular rhythm.     Pulses: Normal pulses.     Heart sounds: Normal heart sounds.  Pulmonary:     Effort: Pulmonary effort is  normal.     Breath sounds: Normal breath sounds.  Abdominal:     General: Bowel sounds are normal.     Palpations: Abdomen is soft.  Musculoskeletal:        General: Swelling present. Normal range of motion.     Cervical back: Normal range of motion.     Comments: BLE 1+edema  Skin:    General: Skin is warm and dry.  Neurological:     General: No focal deficit present.     Mental Status: He is alert and oriented to person, place, and time.  Psychiatric:        Mood and Affect:  Mood normal.        Behavior: Behavior normal.        Labs reviewed: Recent Labs    01/14/21 0843 03/20/21 0000 08/19/21 0134 11/05/21 0000  NA 134* 135* 136* 142  K 4.2 4.5 4.4 3.5  CL 100 99 99 103  CO2 27 27* 27* 29*  GLUCOSE 89  --   --   --   BUN 25 17 25* 27*  CREATININE 1.29* 1.3 1.4* 1.4*  CALCIUM 8.8 9.1 8.9 8.9   Recent Labs    01/14/21 0843 08/19/21 0134  AST 16 16  ALT 11 12  ALKPHOS  --  71  BILITOT 0.6  --   PROT 6.2  --   ALBUMIN  --  3.4*   Recent Labs    01/14/21 0843 08/19/21 0134  WBC 5.9 5.2  NEUTROABS 2,921  --   HGB 13.6 13.1*  HCT 40.6 38*  MCV 96.9  --   PLT 254 207   Lab Results  Component Value Date   TSH 2.08 09/30/2021   No results found for: "HGBA1C" Lab Results  Component Value Date   CHOL 183 05/05/2020   HDL 54 05/05/2020   LDLCALC 107 (H) 05/05/2020   TRIG 128 05/05/2020   CHOLHDL 3.4 05/05/2020    Significant Diagnostic Results in last 30 days:  No results found.  Assessment/Plan  1. Chronic diastolic congestive heart failure (HCC) -   Stable, continue torsemide  2. Phimosis -  follows up with urology, for possible dorsal slit  3. Stage 3a chronic kidney disease (HCC) Lab Results  Component Value Date   NA 142 11/05/2021   K 3.5 11/05/2021   CO2 29 (A) 11/05/2021   GLUCOSE 89 01/14/2021   BUN 27 (A) 11/05/2021   CREATININE 1.4 (A) 11/05/2021   CALCIUM 8.9 11/05/2021   EGFR 46 08/19/2021   GFRNONAA 52 (L) 11/22/2020    -  stable  4. Hypothyroidism, unspecified type Lab Results  Component Value Date   TSH 2.08 09/30/2021   -Continue Synthroid  5. Insomnia, unspecified type -    Sleeps well, continue Ambien    Family/ staff Communication: Discussed plan of care with resident and charge nurse.  Labs/tests ordered:   None    Durenda Age, DNP, MSN, FNP-BC [facility] [phone] (Monday-Friday 8:00 a.m. - 5:00 p.m.) [phone] (after hours)

## 2021-12-30 ENCOUNTER — Other Ambulatory Visit: Payer: Self-pay | Admitting: Orthopedic Surgery

## 2021-12-30 DIAGNOSIS — G4700 Insomnia, unspecified: Secondary | ICD-10-CM

## 2021-12-30 MED ORDER — ZOLPIDEM TARTRATE 5 MG PO TABS: ORAL_TABLET | ORAL | 0 refills | Status: DC

## 2022-01-29 ENCOUNTER — Non-Acute Institutional Stay (SKILLED_NURSING_FACILITY): Payer: Medicare PPO | Admitting: Nurse Practitioner

## 2022-01-29 ENCOUNTER — Encounter: Payer: Self-pay | Admitting: Nurse Practitioner

## 2022-01-29 DIAGNOSIS — I480 Paroxysmal atrial fibrillation: Secondary | ICD-10-CM | POA: Diagnosis not present

## 2022-01-29 DIAGNOSIS — R1310 Dysphagia, unspecified: Secondary | ICD-10-CM

## 2022-01-29 DIAGNOSIS — R350 Frequency of micturition: Secondary | ICD-10-CM

## 2022-01-29 DIAGNOSIS — D6859 Other primary thrombophilia: Secondary | ICD-10-CM

## 2022-01-29 DIAGNOSIS — E785 Hyperlipidemia, unspecified: Secondary | ICD-10-CM

## 2022-01-29 DIAGNOSIS — Z8719 Personal history of other diseases of the digestive system: Secondary | ICD-10-CM

## 2022-01-29 DIAGNOSIS — I2510 Atherosclerotic heart disease of native coronary artery without angina pectoris: Secondary | ICD-10-CM | POA: Diagnosis not present

## 2022-01-29 DIAGNOSIS — G4700 Insomnia, unspecified: Secondary | ICD-10-CM

## 2022-01-29 DIAGNOSIS — K5901 Slow transit constipation: Secondary | ICD-10-CM

## 2022-01-29 DIAGNOSIS — I270 Primary pulmonary hypertension: Secondary | ICD-10-CM

## 2022-01-29 DIAGNOSIS — I10 Essential (primary) hypertension: Secondary | ICD-10-CM

## 2022-01-29 DIAGNOSIS — E039 Hypothyroidism, unspecified: Secondary | ICD-10-CM

## 2022-01-29 DIAGNOSIS — I6300 Cerebral infarction due to thrombosis of unspecified precerebral artery: Secondary | ICD-10-CM

## 2022-01-29 DIAGNOSIS — N183 Chronic kidney disease, stage 3 unspecified: Secondary | ICD-10-CM

## 2022-01-29 NOTE — Assessment & Plan Note (Signed)
slightly left sided weakness with muscle strength 5/5 

## 2022-01-29 NOTE — Assessment & Plan Note (Signed)
on ASA, Fish oil, Coronary artery bypass grafting 1992 

## 2022-01-29 NOTE — Assessment & Plan Note (Signed)
tachy brady, refused PPM, heart rate is in control. EKG 12/05/19 Afib at cardiology, only takes ASA 

## 2022-01-29 NOTE — Assessment & Plan Note (Signed)
Blood pressure is controlled, continue Torsemide.

## 2022-01-29 NOTE — Assessment & Plan Note (Signed)
Stable, takes MiraLax, Colace.  

## 2022-01-29 NOTE — Progress Notes (Unsigned)
Location:   [facility] Room Number: NO64/A Place of Service:  SNF (31) Provider: Lennie Odor Marla Pouliot NP  Briony Parveen X, NP  Patient Care Team: Lakethia Coppess X, NP as PCP - General (Internal Medicine) Belva Crome, MD as PCP - Cardiology (Cardiology)  Extended Emergency Contact Information Primary Emergency Contact: Carson Myrtle Address: [address]          [place], [place] Phone: [phone] Relation: Spouse Secondary Emergency Contact: Alan Ripper Address: [place], [place] [address] [place] Phone: [phone] Relation: Daughter  Code Status:  DNR Goals of care: Advanced Directive information    01/29/2022   10:59 AM  Advanced Directives  Does Patient Have a Medical Advance Directive? Yes  Type of Advance Directive Living will;Out of facility DNR (pink MOST or yellow form)  Does patient want to make changes to medical advance directive? No - Patient declined     Chief Complaint  Patient presents with   Medical Management of Chronic Issues    Patient is here for a follow up for chronic conditions, patient is due for updated vaccines    HPI:  Pt is a 88 y.o. male seen today for medical management of chronic diseases.      Afib, tachy brady, refused PPM, heart rate is in control. EKG 12/05/19 Afib at cardiology, only takes ASA             Thrombophilia, had work up in the past, venous US negative DVT in legs.               Urinary frequency, no difference since off Mirabegran due to cost, had urinary retention, s/p ED eval 11/22/20, f/u urology             CAD, on ASA, Fish oil, Coronary artery bypass grafting 1992             Hx of GI bleed, Hgb 13.1 08/19/21             CVA, slightly left sided weakness with muscle strength 5/5             HTN, controlled, on Torsemide.  Pulmonary hypertension/edema BLE, takes Torsemide, [place] 6/18/21negative DVT Insomnia, takes Zolpidem             Hypothyroidism,  takes Levothyroxine, TSH 2.08 09/30/21             CKD Bun/creat 27/1.4 11/05/21             Hyperlipidemia, LDL 107 05/05/20, on Omega 3 supplement             Constipation, takes MiraLax, Colace.  Past Medical History:  Diagnosis Date   Atrial fibrillation (HCC)    CAD (coronary artery disease)    Diverticulosis    Hypercholesteremia    Hypertension    Hypothyroidism    Insomnia    Murmur    Paroxysmal atrial fibrillation (HCC)    Urinary dysfunction    Past Surgical History:  Procedure Laterality Date   BYPASS GRAFT     HEMORRHOID SURGERY     HERNIA REPAIR     HIP SURGERY      Allergies  Allergen Reactions   Cranberry Extract Cough   Shellfish Allergy Swelling and Rash    Allergies as of 01/29/2022       Reactions   Cranberry Extract Cough   Shellfish Allergy  Swelling, Rash        Medication List        Accurate as of January 29, 2022 11:59 PM. If you have any questions, ask your nurse or doctor.          aspirin EC 81 MG tablet in the morning and at bedtime.   cholecalciferol 1000 units tablet Commonly known as: VITAMIN D Take 1,000 Units by mouth daily.   docusate sodium 100 MG capsule Commonly known as: COLACE Take 200 mg by mouth at bedtime.   levothyroxine 50 MCG tablet Commonly known as: SYNTHROID Take 50 mcg by mouth daily before breakfast.   omega-3 acid ethyl esters 1 g capsule Commonly known as: LOVAZA Take 1 g by mouth 2 (two) times daily.   polyethylene glycol 17 g packet Commonly known as: MIRALAX / GLYCOLAX Take 17 g by mouth daily.   MiraLax 17 g packet Generic drug: polyethylene glycol Take 17 g by mouth daily as needed.   potassium chloride 10 MEQ CR capsule Commonly known as: MICRO-K Take 10 mEq by mouth daily.   PRESERVISION AREDS 2 PO Take by mouth daily.   Therems-M Tabs Take 1 tablet by mouth daily.   saccharomyces boulardii 250 MG capsule Commonly known as: FLORASTOR Take 250 mg by mouth daily.   Systane  0.4-0.3 % Soln Generic drug: Polyethyl Glycol-Propyl Glycol Place 2 drops into the left eye at bedtime.   torsemide 20 MG tablet Commonly known as: DEMADEX Take 20 mg by mouth daily.   vitamin C 1000 MG tablet Take 1,000 mg by mouth in the morning and at bedtime.   zolpidem 5 MG tablet Commonly known as: AMBIEN TAKE ONE TABLET AT BEDTIME AS NEEDED FOR SLEEP.        Review of Systems  Constitutional:  Negative for activity change, appetite change and fever.  HENT:  Positive for hearing loss and trouble swallowing. Negative for congestion and voice change.   Respiratory:  Positive for shortness of breath. Negative for cough and wheezing.        Occasional nocturnal orthopnea at his baseline.   Cardiovascular:  Positive for leg swelling. Negative for chest pain and palpitations.  Gastrointestinal:  Negative for abdominal pain and constipation.  Genitourinary:  Positive for frequency. Negative for difficulty urinating and urgency.  Musculoskeletal:  Positive for arthralgias and gait problem.  Neurological:  Negative for speech difficulty, weakness and headaches.  Psychiatric/Behavioral:  Negative for confusion and sleep disturbance. The patient is not nervous/anxious.     Immunization History  Administered Date(s) Administered   Influenza, High Dose Seasonal PF 04/11/2017, 03/26/2020   Influenza-Unspecified 03/26/2020, 04/01/2021   Moderna SARS-COV2 Booster Vaccination 04/06/2020   Moderna Sars-Covid-2 Vaccination 06/18/2019, 07/16/2019, 10/30/2021   PFIZER(Purple Top)SARS-COV-2 Vaccination 09/14/2020, 03/03/2021   Pneumococcal-Unspecified 08/12/2012   Tetanus 06/14/2004   Zoster Recombinat (Shingrix) 02/03/2017, 06/02/2017   Pertinent  Health Maintenance Due  Topic Date Due   INFLUENZA VACCINE  01/12/2022      05/16/2020   11:54 AM 08/12/2020    1:19 PM 09/18/2020    3:37 PM 11/22/2020   10:54 AM  Fall Risk  Falls in the past year? 0 0 0   Was there an injury with Fall?    0   Fall Risk Category Calculator   0   Fall Risk Category   Low   Patient Fall Risk Level   Low fall risk Low fall risk   Functional Status Survey:    Vitals:  01/29/22 1057  BP: 131/88  Pulse: 77  Resp: 18  Temp: (!) 97.4 F (36.3 C)  SpO2: 90%  Weight: 163 lb 14.4 oz (74.3 kg)  Height: '5\' 4"'$  (1.626 m)   Body mass index is 28.13 kg/m. Physical Exam Vitals and nursing note reviewed.  Constitutional:      Appearance: Normal appearance.  HENT:     Head: Normocephalic and atraumatic.     Mouth/Throat:     Mouth: Mucous membranes are moist.  Eyes:     Extraocular Movements: Extraocular movements intact.     Conjunctiva/sclera: Conjunctivae normal.     Pupils: Pupils are equal, round, and reactive to light.     Comments: Left lower eyelid ectropion.   Cardiovascular:     Rate and Rhythm: Normal rate. Rhythm irregular.     Heart sounds: Murmur heard.  Pulmonary:     Effort: Pulmonary effort is normal.     Breath sounds: Rales present.     Comments: Bibasilar rales.  Abdominal:     General: Bowel sounds are normal.     Palpations: Abdomen is soft.     Tenderness: There is no abdominal tenderness.  Musculoskeletal:     Cervical back: Normal range of motion and neck supple.     Right lower leg: Edema present.     Left lower leg: Edema present.     Comments: 1-2 + edema BLE L>R, uses TED sometimes.  Skin:    General: Skin is warm and dry.  Neurological:     General: No focal deficit present.     Mental Status: He is alert and oriented to person, place, and time. Mental status is at baseline.     Motor: Weakness present.     Coordination: Coordination normal.     Gait: Gait abnormal.     Comments: Left sided weakness form previous CVA, muscle strength 5/5  Psychiatric:        Mood and Affect: Mood normal.        Behavior: Behavior normal.        Thought Content: Thought content normal.        Judgment: Judgment normal.     Labs reviewed: Recent Labs     03/20/21 0000 08/19/21 0134 11/05/21 0000  NA 135* 136* 142  K 4.5 4.4 3.5  CL 99 99 103  CO2 27* 27* 29*  BUN 17 25* 27*  CREATININE 1.3 1.4* 1.4*  CALCIUM 9.1 8.9 8.9   Recent Labs    08/19/21 0134  AST 16  ALT 12  ALKPHOS 71  ALBUMIN 3.4*   Recent Labs    08/19/21 0134  WBC 5.2  HGB 13.1*  HCT 38*  PLT 207   Lab Results  Component Value Date   TSH 2.08 09/30/2021   No results found for: "HGBA1C" Lab Results  Component Value Date   CHOL 183 05/05/2020   HDL 54 05/05/2020   LDLCALC 107 (H) 05/05/2020   TRIG 128 05/05/2020   CHOLHDL 3.4 05/05/2020    Significant Diagnostic Results in last 30 days:  No results found.  Assessment/Plan  Paroxysmal atrial fibrillation  tachy brady, refused PPM, heart rate is in control. EKG 12/05/19 Afib at cardiology, only takes ASA  Thrombophilia ([place])  had work up in the past, venous US negative DVT in legs.   Urinary frequency no difference since off Mirabegran due to cost, had urinary retention, s/p ED eval 11/22/20, f/u urology  CAD (coronary artery disease) on ASA,  Fish oil, Coronary artery bypass grafting 1992  History of GI bleed Hgb 13.1 08/19/21  CVA (cerebral vascular accident) ([place]) slightly left sided weakness with muscle strength 5/5  Hypertension Blood pressure is controlled, continue Torsemide.   Pulmonary hypertension, primary ([place]) Pulmonary hypertension/edema BLE, takes Torsemide,   Insomnia Managed, takes Zolpidem  Hypothyroidism takes Levothyroxine, TSH 2.08 09/30/21  CKD (chronic kidney disease) stage 3, GFR 30-59 ml/min (HCC) Bun/creat 27/1.4 11/05/21  Hyperlipidemia LDL 107 05/05/20, on Omega 3 supplement  Slow transit constipation Stable,  takes MiraLax, Colace.   Dysphagia Nectar thick liquids, occasionally cough associated with swallowing.    Family/ staff Communication: plan of care reviewed with the patient and charge nurse.   Labs/tests ordered: none  Time spend 35  minutes.

## 2022-01-29 NOTE — Assessment & Plan Note (Signed)
Pulmonary hypertension/edema BLE, takes Torsemide,

## 2022-01-29 NOTE — Assessment & Plan Note (Signed)
Hgb 13.1 08/19/21 

## 2022-01-29 NOTE — Assessment & Plan Note (Signed)
takes Levothyroxine, TSH 2.08 09/30/21 

## 2022-01-29 NOTE — Assessment & Plan Note (Signed)
Bun/creat 27/1.4 11/05/21 

## 2022-01-29 NOTE — Assessment & Plan Note (Signed)
had work up in the past, venous US negative DVT in legs.  

## 2022-01-29 NOTE — Assessment & Plan Note (Signed)
Managed, takes Zolpidem

## 2022-01-29 NOTE — Assessment & Plan Note (Signed)
no difference since off Mirabegran due to cost, had urinary retention, s/p ED eval 11/22/20, f/u urology 

## 2022-01-29 NOTE — Assessment & Plan Note (Signed)
LDL 107 05/05/20, on Omega 3 supplement 

## 2022-02-01 DIAGNOSIS — R1310 Dysphagia, unspecified: Secondary | ICD-10-CM | POA: Insufficient documentation

## 2022-02-01 NOTE — Assessment & Plan Note (Signed)
Nectar thick liquids, occasionally cough associated with swallowing.

## 2022-02-16 ENCOUNTER — Encounter: Payer: Self-pay | Admitting: Nurse Practitioner

## 2022-02-16 ENCOUNTER — Non-Acute Institutional Stay (SKILLED_NURSING_FACILITY): Payer: Medicare PPO | Admitting: Nurse Practitioner

## 2022-02-16 DIAGNOSIS — I480 Paroxysmal atrial fibrillation: Secondary | ICD-10-CM | POA: Diagnosis not present

## 2022-02-16 DIAGNOSIS — I2510 Atherosclerotic heart disease of native coronary artery without angina pectoris: Secondary | ICD-10-CM

## 2022-02-16 DIAGNOSIS — R350 Frequency of micturition: Secondary | ICD-10-CM

## 2022-02-16 DIAGNOSIS — G4700 Insomnia, unspecified: Secondary | ICD-10-CM

## 2022-02-16 DIAGNOSIS — E039 Hypothyroidism, unspecified: Secondary | ICD-10-CM

## 2022-02-16 DIAGNOSIS — K5901 Slow transit constipation: Secondary | ICD-10-CM

## 2022-02-16 DIAGNOSIS — D6859 Other primary thrombophilia: Secondary | ICD-10-CM

## 2022-02-16 DIAGNOSIS — I270 Primary pulmonary hypertension: Secondary | ICD-10-CM

## 2022-02-16 DIAGNOSIS — I6300 Cerebral infarction due to thrombosis of unspecified precerebral artery: Secondary | ICD-10-CM

## 2022-02-16 DIAGNOSIS — N183 Chronic kidney disease, stage 3 unspecified: Secondary | ICD-10-CM

## 2022-02-16 DIAGNOSIS — E785 Hyperlipidemia, unspecified: Secondary | ICD-10-CM

## 2022-02-16 DIAGNOSIS — Z8719 Personal history of other diseases of the digestive system: Secondary | ICD-10-CM

## 2022-02-16 DIAGNOSIS — I10 Essential (primary) hypertension: Secondary | ICD-10-CM

## 2022-02-16 NOTE — Assessment & Plan Note (Signed)
Thrombophilia, had work up in the past, venous US negative DVT in legs.

## 2022-02-16 NOTE — Assessment & Plan Note (Addendum)
LDL 107 05/05/20, on Omega 3 supplement 

## 2022-02-16 NOTE — Assessment & Plan Note (Signed)
on ASA, Fish oil, Coronary artery bypass grafting 1992 

## 2022-02-16 NOTE — Assessment & Plan Note (Signed)
Hx of GI bleed, Hgb 13.1 08/19/21 

## 2022-02-16 NOTE — Assessment & Plan Note (Signed)
takes Zolpidem.  ?

## 2022-02-16 NOTE — Assessment & Plan Note (Signed)
takes Levothyroxine, TSH 2.08 09/30/21 

## 2022-02-16 NOTE — Assessment & Plan Note (Signed)
Urinary frequency, no difference since off Mirabegran due to cost, had urinary retention, s/p ED eval 11/22/20, f/u urology 

## 2022-02-16 NOTE — Assessment & Plan Note (Signed)
Bun/creat 27/1.4 11/05/21 

## 2022-02-16 NOTE — Progress Notes (Signed)
Location:   [facility] Room Number: 64-A Place of Service:  SNF (31) Provider: Marlana Latus NP  PCP: Sondos Wolfman X, NP  Patient Care Team: Bryden Darden X, NP as PCP - General (Internal Medicine) Belva Crome, MD as PCP - Cardiology (Cardiology)  Extended Emergency Contact Information Primary Emergency Contact: Carson Myrtle Address: [address]          [place], [place] Phone: [phone] Relation: Spouse Secondary Emergency Contact: Alan Ripper Address: [place], [place] [address] [place] Phone: [phone] Relation: Daughter  Code Status: DNR Goals of care: Advanced Directive information    02/16/2022    4:04 PM  Advanced Directives  Does Patient Have a Medical Advance Directive? Yes  Type of Paramedic of [place];Living will;Out of facility DNR (pink MOST or yellow form)  Does patient want to make changes to medical advance directive? No - Patient declined  Copy of [place] in Chart? Yes - validated most recent copy scanned in chart (See row information)     Chief Complaint  Patient presents with   Acute Visit    Reported swelling BLE, c/o not comfortable.     HPI:  Pt is a 88 y.o. male seen today for an acute visit for reported edema BLE, c/o uncomfortable, no open wound or s/s of cellulitis, slightly cool to touch R+L foot. Denied increased SOB, cough, chest pain, or palpitation.     Afib, tachy brady, refused PPM, heart rate is in control. EKG 12/05/19 Afib at cardiology, only takes ASA             Thrombophilia, had work up in the past, venous US negative DVT in legs.               Urinary frequency, no difference since off Mirabegran due to cost, had urinary retention, s/p ED eval 11/22/20, f/u urology             CAD, on ASA, Fish oil, Coronary artery bypass grafting 1992             Hx of GI bleed, Hgb 13.1 08/19/21              CVA, slightly left sided weakness with muscle strength 5/5             HTN, controlled, on Torsemide.  Pulmonary hypertension/edema BLE, takes Torsemide, [place] 6/18/21negative DVT Insomnia, takes Zolpidem             Hypothyroidism, takes Levothyroxine, TSH 2.08 09/30/21             CKD Bun/creat 27/1.4 11/05/21             Hyperlipidemia, LDL 107 05/05/20, on Omega 3 supplement             Constipation, takes MiraLax, Colace.   Past Medical History:  Diagnosis Date   Atrial fibrillation (HCC)    CAD (coronary artery disease)    Diverticulosis    Hypercholesteremia    Hypertension    Hypothyroidism    Insomnia    Murmur    Paroxysmal atrial fibrillation (HCC)    Urinary dysfunction    Past Surgical History:  Procedure Laterality Date   BYPASS GRAFT     HEMORRHOID SURGERY     HERNIA REPAIR     HIP SURGERY  Allergies  Allergen Reactions   Cranberry Extract Cough   Shellfish Allergy Swelling and Rash    Allergies as of 02/16/2022       Reactions   Cranberry Extract Cough   Shellfish Allergy Swelling, Rash        Medication List        Accurate as of February 16, 2022 11:59 PM. If you have any questions, ask your nurse or doctor.          aspirin EC 81 MG tablet in the morning and at bedtime.   cholecalciferol 1000 units tablet Commonly known as: VITAMIN D Take 1,000 Units by mouth daily.   docusate sodium 100 MG capsule Commonly known as: COLACE Take 200 mg by mouth at bedtime.   levothyroxine 50 MCG tablet Commonly known as: SYNTHROID Take 50 mcg by mouth daily before breakfast.   omega-3 acid ethyl esters 1 g capsule Commonly known as: LOVAZA Take 1 g by mouth 2 (two) times daily.   polyethylene glycol 17 g packet Commonly known as: MIRALAX / GLYCOLAX Take 17 g by mouth daily as needed.   potassium chloride 10 MEQ CR capsule Commonly known as: MICRO-K Take 10 mEq by mouth daily.   PRESERVISION AREDS 2 PO Take by mouth daily.   Therems-M  Tabs Take 1 tablet by mouth daily.   saccharomyces boulardii 250 MG capsule Commonly known as: FLORASTOR Take 250 mg by mouth daily.   Systane 0.4-0.3 % Soln Generic drug: Polyethyl Glycol-Propyl Glycol Place 2 drops into the left eye at bedtime.   torsemide 20 MG tablet Commonly known as: DEMADEX Take 20 mg by mouth daily.   vitamin C 1000 MG tablet Take 1,000 mg by mouth in the morning and at bedtime.   zolpidem 5 MG tablet Commonly known as: AMBIEN TAKE ONE TABLET AT BEDTIME AS NEEDED FOR SLEEP.        Review of Systems  Constitutional:  Negative for activity change, appetite change and fever.  HENT:  Positive for hearing loss and trouble swallowing. Negative for congestion and voice change.   Respiratory:  Positive for shortness of breath. Negative for cough and wheezing.        Occasional nocturnal orthopnea at his baseline.   Cardiovascular:  Positive for leg swelling. Negative for chest pain and palpitations.  Gastrointestinal:  Negative for abdominal pain and constipation.  Genitourinary:  Positive for frequency. Negative for difficulty urinating and urgency.  Musculoskeletal:  Positive for arthralgias and gait problem.  Neurological:  Negative for speech difficulty, weakness and headaches.  Psychiatric/Behavioral:  Negative for confusion and sleep disturbance. The patient is not nervous/anxious.     Immunization History  Administered Date(s) Administered   Influenza, High Dose Seasonal PF 04/11/2017, 03/26/2020   Influenza-Unspecified 03/26/2020, 04/01/2021   Moderna SARS-COV2 Booster Vaccination 04/06/2020   Moderna Sars-Covid-2 Vaccination 06/18/2019, 07/16/2019, 10/30/2021   PFIZER(Purple Top)SARS-COV-2 Vaccination 09/14/2020, 03/03/2021   Pneumococcal-Unspecified 08/12/2012   Tetanus 06/14/2004   Zoster Recombinat (Shingrix) 02/03/2017, 06/02/2017   Pertinent  Health Maintenance Due  Topic Date Due   INFLUENZA VACCINE  01/12/2022      05/16/2020    11:54 AM 08/12/2020    1:19 PM 09/18/2020    3:37 PM 11/22/2020   10:54 AM  Fall Risk  Falls in the past year? 0 0 0   Was there an injury with Fall?   0   Fall Risk Category Calculator   0   Fall Risk Category   Low  Patient Fall Risk Level   Low fall risk Low fall risk   Functional Status Survey:    Vitals:   02/16/22 1246  BP: (!) 142/70  Pulse: 68  Resp: 20  Temp: 98.1 F (36.7 C)  SpO2: 93%   There is no height or weight on file to calculate BMI. Physical Exam Vitals and nursing note reviewed.  Constitutional:      Appearance: Normal appearance.  HENT:     Head: Normocephalic and atraumatic.     Mouth/Throat:     Mouth: Mucous membranes are moist.  Eyes:     Extraocular Movements: Extraocular movements intact.     Conjunctiva/sclera: Conjunctivae normal.     Pupils: Pupils are equal, round, and reactive to light.     Comments: Left lower eyelid ectropion.   Cardiovascular:     Rate and Rhythm: Normal rate. Rhythm irregular.     Heart sounds: Murmur heard.  Pulmonary:     Effort: Pulmonary effort is normal.     Breath sounds: Rales present.     Comments: Bibasilar rales.  Abdominal:     General: Bowel sounds are normal.     Palpations: Abdomen is soft.     Tenderness: There is no abdominal tenderness.  Musculoskeletal:     Cervical back: Normal range of motion and neck supple.     Right lower leg: Edema present.     Left lower leg: Edema present.     Comments: 1-2 + edema BLE L>R, uses TED sometimes.  Skin:    General: Skin is warm and dry.  Neurological:     General: No focal deficit present.     Mental Status: He is alert and oriented to person, place, and time. Mental status is at baseline.     Motor: Weakness present.     Coordination: Coordination normal.     Gait: Gait abnormal.     Comments: Left sided weakness form previous CVA, muscle strength 5/5  Psychiatric:        Mood and Affect: Mood normal.        Behavior: Behavior normal.         Thought Content: Thought content normal.        Judgment: Judgment normal.     Labs reviewed: Recent Labs    03/20/21 0000 08/19/21 0134 11/05/21 0000  NA 135* 136* 142  K 4.5 4.4 3.5  CL 99 99 103  CO2 27* 27* 29*  BUN 17 25* 27*  CREATININE 1.3 1.4* 1.4*  CALCIUM 9.1 8.9 8.9   Recent Labs    08/19/21 0134  AST 16  ALT 12  ALKPHOS 71  ALBUMIN 3.4*   Recent Labs    08/19/21 0134  WBC 5.2  HGB 13.1*  HCT 38*  PLT 207   Lab Results  Component Value Date   TSH 2.08 09/30/2021   No results found for: "HGBA1C" Lab Results  Component Value Date   CHOL 183 05/05/2020   HDL 54 05/05/2020   LDLCALC 107 (H) 05/05/2020   TRIG 128 05/05/2020   CHOLHDL 3.4 05/05/2020    Significant Diagnostic Results in last 30 days:  No results found.  Assessment/Plan: Pulmonary hypertension, primary ([place]) Pulmonary hypertension/edema BLE, takes Torsemide, [place] 6/18/21negative, reported edema BLE, c/o uncomfortable, no open wound or s/s of cellulitis, slightly cool to touch R+L foot. Denied increased SOB, cough, chest pain, or palpitation.  Weigh daily ac breakfast, report if weight gained >2-3Ibs/day or 3-5Ibs a week.  Paroxysmal atrial fibrillation Afib, tachy brady, refused PPM, heart rate is in control. EKG 12/05/19 Afib at cardiology, only takes ASA  Thrombophilia ([place]) Thrombophilia, had work up in the past, venous US negative DVT in legs.   Urinary frequency Urinary frequency, no difference since off Mirabegran due to cost, had urinary retention, s/p ED eval 11/22/20, f/u urology  CAD (coronary artery disease)  on ASA, Fish oil, Coronary artery bypass grafting 1992  History of GI bleed   Hx of GI bleed, Hgb 13.1 08/19/21  CVA (cerebral vascular accident) ([place])  slightly left sided weakness with muscle strength 5/5  Hypertension controlled, on Torsemide.   Insomnia takes Zolpidem  Hypothyroidism  takes Levothyroxine, TSH 2.08 09/30/21  Hyperlipidemia LDL 107  05/05/20, on Omega 3 supplement  CKD (chronic kidney disease) stage 3, GFR 30-59 ml/min (HCC) Bun/creat 27/1.4 11/05/21   Slow transit constipation Stable, takes MiraLax, Colace.     Family/ staff Communication: plan of care reviewed with the patient and charge nurse.   Labs/tests ordered:  none  Time spend 35 minutes.

## 2022-02-16 NOTE — Assessment & Plan Note (Signed)
Afib, tachy brady, refused PPM, heart rate is in control. EKG 12/05/19 Afib at cardiology, only takes ASA

## 2022-02-16 NOTE — Assessment & Plan Note (Addendum)
Pulmonary hypertension/edema BLE, takes Torsemide, [place] 6/18/21negative, reported edema BLE, c/o uncomfortable, no open wound or s/s of cellulitis, slightly cool to touch R+L foot. Denied increased SOB, cough, chest pain, or palpitation.  Weigh daily ac breakfast, report if weight gained >2-3Ibs/day or 3-5Ibs a week.

## 2022-02-16 NOTE — Assessment & Plan Note (Signed)
Stable, takes MiraLax, Colace.  

## 2022-02-16 NOTE — Assessment & Plan Note (Signed)
controlled, on Torsemide.  

## 2022-02-16 NOTE — Assessment & Plan Note (Signed)
slightly left sided weakness with muscle strength 5/5 

## 2022-02-19 ENCOUNTER — Encounter: Payer: Self-pay | Admitting: Nurse Practitioner

## 2022-03-02 ENCOUNTER — Non-Acute Institutional Stay (SKILLED_NURSING_FACILITY): Payer: Medicare PPO | Admitting: Family Medicine

## 2022-03-02 DIAGNOSIS — I2510 Atherosclerotic heart disease of native coronary artery without angina pectoris: Secondary | ICD-10-CM

## 2022-03-02 DIAGNOSIS — I10 Essential (primary) hypertension: Secondary | ICD-10-CM

## 2022-03-02 DIAGNOSIS — I480 Paroxysmal atrial fibrillation: Secondary | ICD-10-CM

## 2022-03-02 DIAGNOSIS — I270 Primary pulmonary hypertension: Secondary | ICD-10-CM

## 2022-03-02 NOTE — Progress Notes (Signed)
Provider:  Alain Honey, MD Location:      Place of Service:     PCP: Mast, Man X, NP Patient Care Team: Mast, Man X, NP as PCP - General (Internal Medicine) Belva Crome, MD as PCP - Cardiology (Cardiology)  Extended Emergency Contact Information Primary Emergency Contact: Carson Myrtle Address: [address]          [place], [place] Phone: [phone] Relation: Spouse Secondary Emergency Contact: Alan Ripper Address: [place], [place] [address] [place] Phone: [phone] Relation: Daughter  Code Status:  Goals of Care: Advanced Directive information    02/16/2022    4:04 PM  Advanced Directives  Does Patient Have a Medical Advance Directive? Yes  Type of Paramedic of [place];Living will;Out of facility DNR (pink MOST or yellow form)  Does patient want to make changes to medical advance directive? No - Patient declined  Copy of [place] in Chart? Yes - validated most recent copy scanned in chart (See row information)      No chief complaint on file.   HPI: Patient is a 88 y.o. male seen today for medical management of chronic problems including atrial fibrillation, coronary artery disease, hypercholesterolemia, hypertension hypothyroidism.  Dr. Soyla Dryer stays in his room most of the time.  He has a daughter who lives locally who visits and tends to his needs on a regular basis.  In fact she called him just as I was beginning my visit. He has no specific complaints today except service is sometimes lacking when he calls.  Reports no chest pain, shortness of breath, bladder or bowel dysfunction,.  He ambulates via wheelchair.  He does go to the dining room for meals but otherwise spends most of his time in the room.  Past Medical History:  Diagnosis Date   Atrial fibrillation (HCC)    CAD (coronary artery disease)    Diverticulosis    Hypercholesteremia     Hypertension    Hypothyroidism    Insomnia    Murmur    Paroxysmal atrial fibrillation (HCC)    Urinary dysfunction    Past Surgical History:  Procedure Laterality Date   BYPASS GRAFT     HEMORRHOID SURGERY     HERNIA REPAIR     HIP SURGERY      reports that he has quit smoking. He has never used smokeless tobacco. He reports current alcohol use. He reports that he does not use drugs. Social History   Socioeconomic History   Marital status: Married    Spouse name: Not on file   Number of children: Not on file   Years of education: Not on file   Highest education level: Not on file  Occupational History   Not on file  Tobacco Use   Smoking status: Former   Smokeless tobacco: Never  Substance and Sexual Activity   Alcohol use: Yes    Comment: occasional   Drug use: No   Sexual activity: Not Currently  Other Topics Concern   Not on file  Social History Narrative   Social History     Tobacco Use       Smoking status: None       Smokeless tobacco: Never Used     Alcohol use: No       Alcohol/week: 0.0 standard drinks     Drug use: No   Diet  is good   No caffeine   Married since 1952   Lives in apartment, one story   Lives with full time home health   Pet- Cat (Tai)   Highest level of education: PHD   Past profession- University professor   Some exercise-upper arm      Living Will-Yes   DNR-Yes   POA/HPOA- Yes      Difficulty bathing, dressing, preparing, eating, managing medications, and managing finances.   No difficulty affording medications   Social Determinants of Health   Financial Resource Strain: Not on file  Food Insecurity: Not on file  Transportation Needs: Not on file  Physical Activity: Not on file  Stress: Not on file  Social Connections: Not on file  Intimate Partner Violence: Not on file    Functional Status Survey:    Family History  Problem Relation Age of Onset   Heart disease Mother    Cancer Father    ALS Brother     Heart disease Daughter     Health Maintenance  Topic Date Due   TETANUS/TDAP  06/14/2014   COVID-19 Vaccine (6 - Moderna series) 12/25/2021   INFLUENZA VACCINE  01/12/2022   Pneumonia Vaccine 85+ Years old  Completed   Zoster Vaccines- Shingrix  Completed   HPV VACCINES  Aged Out    Allergies  Allergen Reactions   Cranberry Extract Cough   Shellfish Allergy Swelling and Rash    Outpatient Encounter Medications as of 03/02/2022  Medication Sig   Ascorbic Acid (VITAMIN C) 1000 MG tablet Take 1,000 mg by mouth in the morning and at bedtime.   aspirin 81 MG EC tablet in the morning and at bedtime.    cholecalciferol (VITAMIN D) 1000 UNITS tablet Take 1,000 Units by mouth daily.   docusate sodium (COLACE) 100 MG capsule Take 200 mg by mouth at bedtime.   levothyroxine (SYNTHROID) 50 MCG tablet Take 50 mcg by mouth daily before breakfast.   Multiple Vitamins-Minerals (PRESERVISION AREDS 2 PO) Take by mouth daily.   Multiple Vitamins-Minerals (THEREMS-M) TABS Take 1 tablet by mouth daily.   omega-3 acid ethyl esters (LOVAZA) 1 g capsule Take 1 g by mouth 2 (two) times daily.   Polyethyl Glycol-Propyl Glycol (SYSTANE) 0.4-0.3 % SOLN Place 2 drops into the left eye at bedtime.   polyethylene glycol (MIRALAX / GLYCOLAX) 17 g packet Take 17 g by mouth daily as needed.   potassium chloride (MICRO-K) 10 MEQ CR capsule Take 10 mEq by mouth daily.   saccharomyces boulardii (FLORASTOR) 250 MG capsule Take 250 mg by mouth daily.   torsemide (DEMADEX) 20 MG tablet Take 20 mg by mouth daily.   zolpidem (AMBIEN) 5 MG tablet TAKE ONE TABLET AT BEDTIME AS NEEDED FOR SLEEP.   No facility-administered encounter medications on file as of 03/02/2022.    Review of Systems  HENT: Negative.    Respiratory: Negative.    Cardiovascular:  Positive for leg swelling.  Genitourinary:  Positive for frequency.  Neurological: Negative.   All other systems reviewed and are negative.   There were no vitals  filed for this visit. There is no height or weight on file to calculate BMI. Physical Exam Vitals and nursing note reviewed.  Constitutional:      Appearance: Normal appearance.  Cardiovascular:     Rate and Rhythm: Normal rate. Rhythm irregular.     Heart sounds: Murmur heard.     Comments: Murmur originates in mitral valve Pulmonary:     Effort:  Pulmonary effort is normal.     Breath sounds: Normal breath sounds.  Abdominal:     General: Abdomen is flat. Bowel sounds are normal.     Palpations: Abdomen is soft.  Neurological:     General: No focal deficit present.     Mental Status: He is alert and oriented to person, place, and time.  Psychiatric:        Mood and Affect: Mood normal.        Behavior: Behavior normal.        Thought Content: Thought content normal.        Judgment: Judgment normal.     Labs reviewed: Basic Metabolic Panel: Recent Labs    03/20/21 0000 08/19/21 0134 11/05/21 0000  NA 135* 136* 142  K 4.5 4.4 3.5  CL 99 99 103  CO2 27* 27* 29*  BUN 17 25* 27*  CREATININE 1.3 1.4* 1.4*  CALCIUM 9.1 8.9 8.9   Liver Function Tests: Recent Labs    08/19/21 0134  AST 16  ALT 12  ALKPHOS 71  ALBUMIN 3.4*   No results for input(s): "LIPASE", "AMYLASE" in the last 8760 hours. No results for input(s): "AMMONIA" in the last 8760 hours. CBC: Recent Labs    08/19/21 0134  WBC 5.2  HGB 13.1*  HCT 38*  PLT 207   Cardiac Enzymes: No results for input(s): "CKTOTAL", "CKMB", "CKMBINDEX", "TROPONINI" in the last 8760 hours. BNP: Invalid input(s): "POCBNP" No results found for: "HGBA1C" Lab Results  Component Value Date   TSH 2.08 09/30/2021   No results found for: "VITAMINB12" No results found for: "FOLATE" No results found for: "IRON", "TIBC", "FERRITIN"  Imaging and Procedures obtained prior to SNF admission: No results found.  Assessment/Plan 1. Coronary artery disease involving native coronary artery of native heart without angina  pectoris Denies chest pain.  Had CABG in 1992  2. Primary hypertension Blood pressure controlled on torsemide  3. Paroxysmal atrial fibrillation (HCC) Patient likely in for A-fib today is not on rate controlling drug or anticoagulant  4. Pulmonary hypertension, primary ([place]) Seen recently for some shortness of breath.  I think plan is to monitor his weights closely and increase dose of diuretic based on weights    Family/ staff Communication:   Labs/tests ordered:  Lillette Boxer. Sabra Heck, [place] [address] [place], [place] Office [phone]

## 2022-03-03 ENCOUNTER — Other Ambulatory Visit: Payer: Self-pay | Admitting: Adult Health

## 2022-03-03 DIAGNOSIS — G4700 Insomnia, unspecified: Secondary | ICD-10-CM

## 2022-03-03 MED ORDER — ZOLPIDEM TARTRATE 5 MG PO TABS: 5.0000 mg | ORAL_TABLET | Freq: Every day | ORAL | 0 refills | Status: DC

## 2022-03-31 ENCOUNTER — Other Ambulatory Visit: Payer: Self-pay | Admitting: Adult Health

## 2022-03-31 DIAGNOSIS — G4700 Insomnia, unspecified: Secondary | ICD-10-CM

## 2022-03-31 MED ORDER — ZOLPIDEM TARTRATE 5 MG PO TABS: 5.0000 mg | ORAL_TABLET | Freq: Every day | ORAL | 0 refills | Status: DC

## 2022-04-02 ENCOUNTER — Encounter: Payer: Self-pay | Admitting: Nurse Practitioner

## 2022-04-02 ENCOUNTER — Non-Acute Institutional Stay (SKILLED_NURSING_FACILITY): Payer: Medicare PPO | Admitting: Nurse Practitioner

## 2022-04-02 DIAGNOSIS — D6859 Other primary thrombophilia: Secondary | ICD-10-CM | POA: Diagnosis not present

## 2022-04-02 DIAGNOSIS — I2510 Atherosclerotic heart disease of native coronary artery without angina pectoris: Secondary | ICD-10-CM | POA: Diagnosis not present

## 2022-04-02 DIAGNOSIS — R350 Frequency of micturition: Secondary | ICD-10-CM | POA: Diagnosis not present

## 2022-04-02 DIAGNOSIS — G4700 Insomnia, unspecified: Secondary | ICD-10-CM

## 2022-04-02 DIAGNOSIS — E039 Hypothyroidism, unspecified: Secondary | ICD-10-CM

## 2022-04-02 DIAGNOSIS — I480 Paroxysmal atrial fibrillation: Secondary | ICD-10-CM | POA: Diagnosis not present

## 2022-04-02 DIAGNOSIS — N183 Chronic kidney disease, stage 3 unspecified: Secondary | ICD-10-CM

## 2022-04-02 DIAGNOSIS — I6300 Cerebral infarction due to thrombosis of unspecified precerebral artery: Secondary | ICD-10-CM

## 2022-04-02 DIAGNOSIS — I270 Primary pulmonary hypertension: Secondary | ICD-10-CM

## 2022-04-02 DIAGNOSIS — Z8719 Personal history of other diseases of the digestive system: Secondary | ICD-10-CM

## 2022-04-02 DIAGNOSIS — I10 Essential (primary) hypertension: Secondary | ICD-10-CM

## 2022-04-02 DIAGNOSIS — K5901 Slow transit constipation: Secondary | ICD-10-CM

## 2022-04-02 DIAGNOSIS — E785 Hyperlipidemia, unspecified: Secondary | ICD-10-CM

## 2022-04-02 NOTE — Assessment & Plan Note (Signed)
Stable, takes MiraLax, Colace.  

## 2022-04-02 NOTE — Assessment & Plan Note (Signed)
on ASA, Fish oil, Coronary artery bypass grafting 1992 

## 2022-04-02 NOTE — Assessment & Plan Note (Signed)
takes Levothyroxine, TSH 2.08 09/30/21 

## 2022-04-02 NOTE — Progress Notes (Signed)
Location:  [facility] Room Number: 64/A Place of Service:  SNF (31) Provider:  Jeanine Caven X, NP   Alayah Knouff X, NP  Patient Care Team: Aldine Grainger X, NP as PCP - General (Internal Medicine) Belva Crome, MD as PCP - Cardiology (Cardiology)  Extended Emergency Contact Information Primary Emergency Contact: Carson Myrtle Address: [address]          [place], [place] Phone: [phone] Relation: Spouse Secondary Emergency Contact: Alan Ripper Address: [place], [place] [address] [place] Phone: [phone] Relation: Daughter  Code Status:   Goals of care: Advanced Directive information    04/02/2022   10:17 AM  Advanced Directives  Does Patient Have a Medical Advance Directive? Yes  Type of Paramedic of [place];Living will;Out of facility DNR (pink MOST or yellow form)  Does patient want to make changes to medical advance directive? No - Patient declined  Copy of [place] in Chart? Yes - validated most recent copy scanned in chart (See row information)     Chief Complaint  Patient presents with  . Medical Management of Chronic Issues    Routine Visit  . Immunizations    Discussed the need for tetanus, covid, and flu vaccine    HPI:  Pt is a 88 y.o. male seen today for medical management of chronic diseases.      Edema BLE, c/o uncomfortable, no open wound or s/s of cellulitis, slightly cool to touch R+L foot. Denied increased SOB, cough, chest pain, or palpitation. on Torsemide                          Afib, tachy brady, refused PPM, heart rate is in control. EKG 12/05/19 Afib at cardiology, only takes ASA             Thrombophilia, had work up in the past, venous US negative DVT in legs.               Urinary frequency, no difference since off Mirabegran due to cost, had urinary retention, s/p ED eval 11/22/20, f/u urology              CAD, on ASA, Fish oil, Coronary artery bypass grafting 1992             Hx of GI bleed, Hgb 13.1 08/19/21             CVA, slightly left sided weakness with muscle strength 5/5             HTN, controlled, on Torsemide.  Pulmonary hypertension/edema BLE, takes Torsemide, [place] 6/18/21negative DVT Insomnia, takes Zolpidem             Hypothyroidism, takes Levothyroxine, TSH 2.08 09/30/21             CKD Bun/creat 27/1.4 11/05/21             Hyperlipidemia, LDL 107 05/05/20, on Omega 3 supplement             Constipation, takes MiraLax, Colace.  Past Medical History:  Diagnosis Date  . Atrial fibrillation ([place])   . CAD (coronary artery disease)   . Diverticulosis   . Hypercholesteremia   . Hypertension   . Hypothyroidism   . Insomnia   . Murmur   . Paroxysmal atrial fibrillation (HCC)   .  Urinary dysfunction    Past Surgical History:  Procedure Laterality Date  . BYPASS GRAFT    . HEMORRHOID SURGERY    . HERNIA REPAIR    . HIP SURGERY      Allergies  Allergen Reactions  . Cranberry Extract Cough  . Shellfish Allergy Swelling and Rash    Outpatient Encounter Medications as of 04/02/2022  Medication Sig  . Ascorbic Acid (VITAMIN C) 1000 MG tablet Take 1,000 mg by mouth in the morning and at bedtime.  Marland Kitchen aspirin 81 MG EC tablet in the morning and at bedtime.   . cholecalciferol (VITAMIN D) 1000 UNITS tablet Take 1,000 Units by mouth daily.  Marland Kitchen docusate sodium (COLACE) 100 MG capsule Take 200 mg by mouth at bedtime.  Marland Kitchen levothyroxine (SYNTHROID) 50 MCG tablet Take 50 mcg by mouth daily before breakfast.  . Multiple Vitamins-Minerals (PRESERVISION AREDS 2 PO) Take by mouth daily.  . Multiple Vitamins-Minerals (THEREMS-M) TABS Take 1 tablet by mouth daily.  Marland Kitchen omega-3 acid ethyl esters (LOVAZA) 1 g capsule Take 1 g by mouth 2 (two) times daily.  Vladimir Faster Glycol-Propyl Glycol (SYSTANE) 0.4-0.3 % SOLN Place 2 drops into the left eye at bedtime.  . polyethylene glycol (MIRALAX /  GLYCOLAX) 17 g packet Take 17 g by mouth daily as needed.  . potassium chloride (MICRO-K) 10 MEQ CR capsule Take 10 mEq by mouth daily.  Marland Kitchen saccharomyces boulardii (FLORASTOR) 250 MG capsule Take 250 mg by mouth daily.  Marland Kitchen torsemide (DEMADEX) 20 MG tablet Take 20 mg by mouth daily.  Marland Kitchen zolpidem (AMBIEN) 5 MG tablet Take 1 tablet (5 mg total) by mouth at bedtime. TAKE ONE TABLET AT BEDTIME   No facility-administered encounter medications on file as of 04/02/2022.    Review of Systems  Constitutional:  Negative for activity change, appetite change and fever.  HENT:  Positive for hearing loss and trouble swallowing. Negative for congestion and voice change.   Respiratory:  Positive for shortness of breath. Negative for cough and wheezing.        Occasional nocturnal orthopnea at his baseline.   Cardiovascular:  Positive for leg swelling. Negative for chest pain and palpitations.  Gastrointestinal:  Negative for abdominal pain and constipation.  Genitourinary:  Positive for frequency. Negative for difficulty urinating and urgency.  Musculoskeletal:  Positive for arthralgias and gait problem.  Neurological:  Negative for speech difficulty, weakness and headaches.  Psychiatric/Behavioral:  Negative for confusion and sleep disturbance. The patient is not nervous/anxious.     Immunization History  Administered Date(s) Administered  . Influenza, High Dose Seasonal PF 04/11/2017, 03/26/2020  . Influenza-Unspecified 03/26/2020, 04/01/2021  . Moderna SARS-COV2 Booster Vaccination 04/06/2020  . Moderna Sars-Covid-2 Vaccination 06/18/2019, 07/16/2019, 10/30/2021  . PFIZER(Purple Top)SARS-COV-2 Vaccination 09/14/2020, 03/03/2021  . Pneumococcal-Unspecified 08/12/2012  . Tetanus 06/14/2004  . Zoster Recombinat (Shingrix) 02/03/2017, 06/02/2017   Pertinent  Health Maintenance Due  Topic Date Due  . INFLUENZA VACCINE  01/12/2022      05/16/2020   11:54 AM 08/12/2020    1:19 PM 09/18/2020    3:37 PM  11/22/2020   10:54 AM 04/02/2022   10:16 AM  Fall Risk  Falls in the past year? 0 0 0    Was there an injury with Fall?   0  0  Fall Risk Category Calculator   0    Fall Risk Category   Low    Patient Fall Risk Level   Low fall risk Low fall risk Low fall risk  Patient at Risk for Falls Due to     Impaired balance/gait;Impaired mobility;History of fall(s)  Fall risk Follow up     Falls evaluation completed   Functional Status Survey:    Vitals:   04/02/22 0947  BP: 122/66  Pulse: 70  Resp: 18  Temp: 97.9 F (36.6 C)  SpO2: (!) 62%  Weight: 160 lb 6.4 oz (72.8 kg)  Height: '5\' 4"'$  (1.626 m)   Body mass index is 27.53 kg/m. Physical Exam Vitals and nursing note reviewed.  Constitutional:      Appearance: Normal appearance.  HENT:     Head: Normocephalic and atraumatic.     Mouth/Throat:     Mouth: Mucous membranes are moist.  Eyes:     Extraocular Movements: Extraocular movements intact.     Conjunctiva/sclera: Conjunctivae normal.     Pupils: Pupils are equal, round, and reactive to light.     Comments: Left lower eyelid ectropion.   Cardiovascular:     Rate and Rhythm: Normal rate. Rhythm irregular.     Heart sounds: Murmur heard.  Pulmonary:     Effort: Pulmonary effort is normal.     Breath sounds: Rales present.     Comments: Bibasilar rales.  Abdominal:     General: Bowel sounds are normal.     Palpations: Abdomen is soft.     Tenderness: There is no abdominal tenderness.  Musculoskeletal:     Cervical back: Normal range of motion and neck supple.     Right lower leg: Edema present.     Left lower leg: Edema present.     Comments: 1-2 + edema BLE L>R, uses TED sometimes.  Skin:    General: Skin is warm and dry.  Neurological:     General: No focal deficit present.     Mental Status: He is alert and oriented to person, place, and time. Mental status is at baseline.     Motor: Weakness present.     Coordination: Coordination normal.     Gait: Gait  abnormal.     Comments: Left sided weakness form previous CVA, muscle strength 5/5  Psychiatric:        Mood and Affect: Mood normal.        Behavior: Behavior normal.        Thought Content: Thought content normal.        Judgment: Judgment normal.    Labs reviewed: Recent Labs    08/19/21 0134 11/05/21 0000  NA 136* 142  K 4.4 3.5  CL 99 103  CO2 27* 29*  BUN 25* 27*  CREATININE 1.4* 1.4*  CALCIUM 8.9 8.9   Recent Labs    08/19/21 0134  AST 16  ALT 12  ALKPHOS 71  ALBUMIN 3.4*   Recent Labs    08/19/21 0134  WBC 5.2  HGB 13.1*  HCT 38*  PLT 207   Lab Results  Component Value Date   TSH 2.08 09/30/2021   No results found for: "HGBA1C" Lab Results  Component Value Date   CHOL 183 05/05/2020   HDL 54 05/05/2020   LDLCALC 107 (H) 05/05/2020   TRIG 128 05/05/2020   CHOLHDL 3.4 05/05/2020    Significant Diagnostic Results in last 30 days:  No results found.  Assessment/Plan No problem-specific Assessment & Plan notes found for this encounter.     Family/ staff Communication: plan of care reviewed with the patient and charge nurse.   Labs/tests ordered:  none  Time spend 35  minutes.

## 2022-04-02 NOTE — Assessment & Plan Note (Signed)
takes Zolpidem.  ?

## 2022-04-02 NOTE — Assessment & Plan Note (Signed)
had work up in the past, venous US negative DVT in legs.  

## 2022-04-02 NOTE — Assessment & Plan Note (Signed)
LDL 107 05/05/20, on Omega 3 supplement 

## 2022-04-02 NOTE — Assessment & Plan Note (Signed)
Blood pressure is controlled, controlled, on Torsemide.

## 2022-04-02 NOTE — Assessment & Plan Note (Signed)
tachy brady, refused PPM, heart rate is in control. EKG 12/05/19 Afib at cardiology, only takes ASA 

## 2022-04-02 NOTE — Assessment & Plan Note (Signed)
Bun/creat 27/1.4 11/05/21 

## 2022-04-02 NOTE — Assessment & Plan Note (Signed)
slightly left sided weakness with muscle strength 5/5 

## 2022-04-02 NOTE — Assessment & Plan Note (Signed)
no difference since off Mirabegran due to cost, had urinary retention, s/p ED eval 11/22/20, f/u urolog

## 2022-04-02 NOTE — Assessment & Plan Note (Signed)
Pulmonary hypertension/edema BLE, takes Torsemide, US 6/18/21negative DVT 

## 2022-04-02 NOTE — Assessment & Plan Note (Signed)
Hgb 13.1 08/19/21 

## 2022-04-05 ENCOUNTER — Encounter: Payer: Self-pay | Admitting: Nurse Practitioner

## 2022-04-16 ENCOUNTER — Encounter: Payer: Self-pay | Admitting: Nurse Practitioner

## 2022-04-16 ENCOUNTER — Non-Acute Institutional Stay (SKILLED_NURSING_FACILITY): Payer: Medicare PPO | Admitting: Nurse Practitioner

## 2022-04-16 DIAGNOSIS — I270 Primary pulmonary hypertension: Secondary | ICD-10-CM

## 2022-04-16 DIAGNOSIS — Z8719 Personal history of other diseases of the digestive system: Secondary | ICD-10-CM

## 2022-04-16 DIAGNOSIS — I480 Paroxysmal atrial fibrillation: Secondary | ICD-10-CM

## 2022-04-16 DIAGNOSIS — D6859 Other primary thrombophilia: Secondary | ICD-10-CM

## 2022-04-16 DIAGNOSIS — E039 Hypothyroidism, unspecified: Secondary | ICD-10-CM

## 2022-04-16 DIAGNOSIS — I2510 Atherosclerotic heart disease of native coronary artery without angina pectoris: Secondary | ICD-10-CM

## 2022-04-16 DIAGNOSIS — R350 Frequency of micturition: Secondary | ICD-10-CM | POA: Diagnosis not present

## 2022-04-16 DIAGNOSIS — I10 Essential (primary) hypertension: Secondary | ICD-10-CM

## 2022-04-16 DIAGNOSIS — I6300 Cerebral infarction due to thrombosis of unspecified precerebral artery: Secondary | ICD-10-CM

## 2022-04-16 DIAGNOSIS — G4700 Insomnia, unspecified: Secondary | ICD-10-CM

## 2022-04-16 NOTE — Assessment & Plan Note (Signed)
Hx of GI bleed, Hgb 13.1 08/19/21

## 2022-04-16 NOTE — Assessment & Plan Note (Signed)
takes Zolpidem.  ?

## 2022-04-16 NOTE — Assessment & Plan Note (Signed)
Pulmonary hypertension/edema BLE, takes Torsemide, [place] 6/18/21negative DVT

## 2022-04-16 NOTE — Assessment & Plan Note (Signed)
controlled, on Torsemide.

## 2022-04-16 NOTE — Assessment & Plan Note (Signed)
on ASA, Fish oil, Coronary artery bypass grafting 1992 

## 2022-04-16 NOTE — Assessment & Plan Note (Signed)
slightly left sided weakness with muscle strength 5/5 

## 2022-04-16 NOTE — Assessment & Plan Note (Signed)
no difference since off Mirabegran due to cost, had urinary retention, s/p ED eval 11/22/20, f/u urology 

## 2022-04-16 NOTE — Assessment & Plan Note (Signed)
takes Levothyroxine, TSH 2.08 09/30/21 

## 2022-04-16 NOTE — Assessment & Plan Note (Signed)
had work up in the past, venous US negative DVT in legs.

## 2022-04-16 NOTE — Progress Notes (Signed)
Location:   [facility] Room Number: 21 Place of Service:  SNF (31) Provider: Lennie Odor Jissel Slavens NP  [place] Seaborn X, NP  Patient Care Team: Eli Pattillo X, NP as PCP - General (Internal Medicine) Belva Crome, MD as PCP - Cardiology (Cardiology)  Extended Emergency Contact Information Primary Emergency Contact: Carson Myrtle Address: [address]          [place], [place] Phone: [phone] Relation: Spouse Secondary Emergency Contact: Alan Ripper Address: [place], [place] [address] [place] Phone: [phone] Relation: Daughter  Code Status:  DNR Goals of care: Advanced Directive information    04/02/2022   10:17 AM  Advanced Directives  Does Patient Have a Medical Advance Directive? Yes  Type of Paramedic of [place];Living will;Out of facility DNR (pink MOST or yellow form)  Does patient want to make changes to medical advance directive? No - Patient declined  Copy of [place] in Chart? Yes - validated most recent copy scanned in chart (See row information)     Chief Complaint  Patient presents with   Medical Management of Chronic Issues    HPI:  Pt is a 88 y.o. male seen today for medical management of chronic diseases.               Afib, tachy brady, refused PPM, heart rate is in control. EKG 12/05/19 Afib at cardiology, only takes ASA             Thrombophilia, had work up in the past, venous US negative DVT in legs.               Urinary frequency, no difference since off Mirabegran due to cost, had urinary retention, s/p ED eval 11/22/20, f/u urology             CAD, on ASA, Fish oil, Coronary artery bypass grafting 1992             Hx of GI bleed, Hgb 13.1 08/19/21             CVA, slightly left sided weakness with muscle strength 5/5             HTN, controlled, on Torsemide.  Pulmonary hypertension/edema BLE, takes Torsemide, [place] 6/18/21negative  DVT, last echo 2018 EF 55-60% Insomnia, takes Zolpidem             Hypothyroidism, takes Levothyroxine, TSH 2.08 09/30/21             CKD Bun/creat 27/1.4 11/05/21             Hyperlipidemia, LDL 107 05/05/20, on Omega 3 supplement             Constipation, takes MiraLax, Colace.  Past Medical History:  Diagnosis Date   Atrial fibrillation (HCC)    CAD (coronary artery disease)    Diverticulosis    Hypercholesteremia    Hypertension    Hypothyroidism    Insomnia    Murmur    Paroxysmal atrial fibrillation (HCC)    Urinary dysfunction    Past Surgical History:  Procedure Laterality Date   BYPASS GRAFT     HEMORRHOID SURGERY     HERNIA REPAIR     HIP SURGERY      Allergies  Allergen Reactions   Cranberry Extract Cough   Shellfish Allergy Swelling and Rash    Allergies  as of 04/16/2022       Reactions   Cranberry Extract Cough   Shellfish Allergy Swelling, Rash        Medication List        Accurate as of April 16, 2022  3:26 PM. If you have any questions, ask your nurse or doctor.          aspirin EC 81 MG tablet in the morning and at bedtime.   cholecalciferol 1000 units tablet Commonly known as: VITAMIN D Take 1,000 Units by mouth daily.   docusate sodium 100 MG capsule Commonly known as: COLACE Take 200 mg by mouth at bedtime.   levothyroxine 50 MCG tablet Commonly known as: SYNTHROID Take 50 mcg by mouth daily before breakfast.   omega-3 acid ethyl esters 1 g capsule Commonly known as: LOVAZA Take 1 g by mouth 2 (two) times daily.   polyethylene glycol 17 g packet Commonly known as: MIRALAX / GLYCOLAX Take 17 g by mouth daily as needed.   potassium chloride 10 MEQ CR capsule Commonly known as: MICRO-K Take 10 mEq by mouth daily.   PRESERVISION AREDS 2 PO Take by mouth daily.   Therems-M Tabs Take 1 tablet by mouth daily.   saccharomyces boulardii 250 MG capsule Commonly known as: FLORASTOR Take 250 mg by mouth daily.   Systane  0.4-0.3 % Soln Generic drug: Polyethyl Glycol-Propyl Glycol Place 2 drops into the left eye at bedtime.   torsemide 20 MG tablet Commonly known as: DEMADEX Take 20 mg by mouth daily.   vitamin C 1000 MG tablet Take 1,000 mg by mouth in the morning and at bedtime.   zolpidem 5 MG tablet Commonly known as: AMBIEN Take 1 tablet (5 mg total) by mouth at bedtime. TAKE ONE TABLET AT BEDTIME        Review of Systems  Constitutional:  Negative for activity change, appetite change and fever.  HENT:  Positive for hearing loss and trouble swallowing. Negative for congestion and voice change.   Respiratory:  Positive for shortness of breath. Negative for cough and wheezing.        Occasional nocturnal orthopnea at his baseline.   Cardiovascular:  Positive for leg swelling. Negative for chest pain and palpitations.  Gastrointestinal:  Negative for abdominal pain and constipation.  Genitourinary:  Positive for frequency. Negative for difficulty urinating and urgency.  Musculoskeletal:  Positive for arthralgias and gait problem.  Neurological:  Negative for speech difficulty, weakness and headaches.  Psychiatric/Behavioral:  Negative for confusion and sleep disturbance. The patient is not nervous/anxious.     Immunization History  Administered Date(s) Administered   Influenza, High Dose Seasonal PF 04/11/2017, 03/26/2020   Influenza-Unspecified 03/26/2020, 04/01/2021   Moderna SARS-COV2 Booster Vaccination 04/06/2020   Moderna Sars-Covid-2 Vaccination 06/18/2019, 07/16/2019, 10/30/2021   PFIZER(Purple Top)SARS-COV-2 Vaccination 09/14/2020, 03/03/2021   Pneumococcal-Unspecified 08/12/2012   Tetanus 06/14/2004   Zoster Recombinat (Shingrix) 02/03/2017, 06/02/2017   Pertinent  Health Maintenance Due  Topic Date Due   INFLUENZA VACCINE  01/12/2022      05/16/2020   11:54 AM 08/12/2020    1:19 PM 09/18/2020    3:37 PM 11/22/2020   10:54 AM 04/02/2022   10:16 AM  Fall Risk  Falls in the  past year? 0 0 0    Was there an injury with Fall?   0  0  Fall Risk Category Calculator   0    Fall Risk Category   Low    Patient Fall Risk Level  Low fall risk Low fall risk Low fall risk  Patient at Risk for Falls Due to     Impaired balance/gait;Impaired mobility;History of fall(s)  Fall risk Follow up     Falls evaluation completed   Functional Status Survey:    Vitals:   04/16/22 1513  BP: 118/80  Pulse: 80  Resp: 19  Temp: 97.8 F (36.6 C)  SpO2: 97%  Weight: 155 lb 12.8 oz (70.7 kg)   Body mass index is 26.74 kg/m. Physical Exam Vitals and nursing note reviewed.  Constitutional:      Appearance: Normal appearance.  HENT:     Head: Normocephalic and atraumatic.     Mouth/Throat:     Mouth: Mucous membranes are moist.  Eyes:     Extraocular Movements: Extraocular movements intact.     Conjunctiva/sclera: Conjunctivae normal.     Pupils: Pupils are equal, round, and reactive to light.     Comments: Left lower eyelid ectropion.   Cardiovascular:     Rate and Rhythm: Normal rate. Rhythm irregular.     Heart sounds: Murmur heard.  Pulmonary:     Effort: Pulmonary effort is normal.     Breath sounds: Rales present.     Comments: Bibasilar rales.  Abdominal:     General: Bowel sounds are normal.     Palpations: Abdomen is soft.     Tenderness: There is no abdominal tenderness.  Musculoskeletal:     Cervical back: Normal range of motion and neck supple.     Right lower leg: Edema present.     Left lower leg: Edema present.     Comments: 1-2 + edema BLE L>R, uses TED sometimes.  Skin:    General: Skin is warm and dry.  Neurological:     General: No focal deficit present.     Mental Status: He is alert and oriented to person, place, and time. Mental status is at baseline.     Motor: Weakness present.     Coordination: Coordination normal.     Gait: Gait abnormal.     Comments: Left sided weakness form previous CVA, muscle strength 5/5  Psychiatric:         Mood and Affect: Mood normal.        Behavior: Behavior normal.        Thought Content: Thought content normal.        Judgment: Judgment normal.     Labs reviewed: Recent Labs    08/19/21 0134 11/05/21 0000  NA 136* 142  K 4.4 3.5  CL 99 103  CO2 27* 29*  BUN 25* 27*  CREATININE 1.4* 1.4*  CALCIUM 8.9 8.9   Recent Labs    08/19/21 0134  AST 16  ALT 12  ALKPHOS 71  ALBUMIN 3.4*   Recent Labs    08/19/21 0134  WBC 5.2  HGB 13.1*  HCT 38*  PLT 207   Lab Results  Component Value Date   TSH 2.08 09/30/2021   No results found for: "HGBA1C" Lab Results  Component Value Date   CHOL 183 05/05/2020   HDL 54 05/05/2020   LDLCALC 107 (H) 05/05/2020   TRIG 128 05/05/2020   CHOLHDL 3.4 05/05/2020    Significant Diagnostic Results in last 30 days:  No results found.  Assessment/Plan  Paroxysmal atrial fibrillation  tachy brady in the past, heart rate is in control,  refused PPM, heart rate is in control. EKG 12/05/19 Afib at cardiology, only takes ASA  Thrombophilia (  [place]) had work up in the past, venous US negative DVT in legs.   Urinary frequency no difference since off Mirabegran due to cost, had urinary retention, s/p ED eval 11/22/20, f/u urology  CAD (coronary artery disease) on ASA, Fish oil, Coronary artery bypass grafting 1992  History of GI bleed  Hx of GI bleed, Hgb 13.1 08/19/21  CVA (cerebral vascular accident) ([place]) slightly left sided weakness with muscle strength 5/5  Hypertension  controlled, on Torsemide.   Pulmonary hypertension, primary ([place]) Pulmonary hypertension/edema BLE, takes Torsemide, [place] 6/18/21negative DVT  Insomnia takes Zolpidem  Hypothyroidism  takes Levothyroxine, TSH 2.08 09/30/21   Family/ staff Communication: plan of care reviewed with the patient and charge nurse.   Labs/tests ordered:  none  Time spend 35 minutes.

## 2022-04-16 NOTE — Assessment & Plan Note (Signed)
tachy brady in the past, heart rate is in control,  refused PPM, heart rate is in control. EKG 12/05/19 Afib at cardiology, only takes ASA

## 2022-05-18 ENCOUNTER — Non-Acute Institutional Stay (SKILLED_NURSING_FACILITY): Payer: Medicare PPO | Admitting: Family Medicine

## 2022-05-18 DIAGNOSIS — G4700 Insomnia, unspecified: Secondary | ICD-10-CM | POA: Diagnosis not present

## 2022-05-18 DIAGNOSIS — K5901 Slow transit constipation: Secondary | ICD-10-CM

## 2022-05-18 DIAGNOSIS — I480 Paroxysmal atrial fibrillation: Secondary | ICD-10-CM

## 2022-05-18 DIAGNOSIS — I6300 Cerebral infarction due to thrombosis of unspecified precerebral artery: Secondary | ICD-10-CM | POA: Diagnosis not present

## 2022-05-18 DIAGNOSIS — N183 Chronic kidney disease, stage 3 unspecified: Secondary | ICD-10-CM | POA: Diagnosis not present

## 2022-05-18 DIAGNOSIS — I350 Nonrheumatic aortic (valve) stenosis: Secondary | ICD-10-CM | POA: Diagnosis not present

## 2022-05-18 NOTE — Progress Notes (Deleted)
.  smm

## 2022-05-18 NOTE — Progress Notes (Signed)
Provider:  Alain Honey, MD Location:      Place of Service:     PCP: Mast, Man X, NP Patient Care Team: Mast, Man X, NP as PCP - General (Internal Medicine) Belva Crome, MD as PCP - Cardiology (Cardiology)  Extended Emergency Contact Information Primary Emergency Contact: Carson Myrtle Address: [address]          [place], [place] Phone: [phone] Relation: Spouse Secondary Emergency Contact: Alan Ripper Address: [place], [place] [address] [place] Phone: [phone] Relation: Daughter  Code Status:  Goals of Care: Advanced Directive information    04/02/2022   10:17 AM  Advanced Directives  Does Patient Have a Medical Advance Directive? Yes  Type of Paramedic of [place];Living will;Out of facility DNR (pink MOST or yellow form)  Does patient want to make changes to medical advance directive? No - Patient declined  Copy of [place] in Chart? Yes - validated most recent copy scanned in chart (See row information)      No chief complaint on file.   HPI: Patient is a 88 y.o. male seen today for medical management of chronic problems including paroxysmal atrial fibs, coronary artery disease, hyperlipidemia, hypertension, hypothyroidism. I visited him in his room today where he spends most of the time.  He tells me he gets regular visits from his daughter Vaughan Basta.  He does not leave the room to eat in the dining room.  There he converses mostly with women who he says are difficult to talk to (in other words).  He had his feet elevated today.  Edema is a chronic problem.  He is on Demadex and potassium. He has no specific complaints or issues today.  Past Medical History:  Diagnosis Date   Atrial fibrillation (HCC)    CAD (coronary artery disease)    Diverticulosis    Hypercholesteremia    Hypertension    Hypothyroidism    Insomnia    Murmur     Paroxysmal atrial fibrillation (HCC)    Urinary dysfunction    Past Surgical History:  Procedure Laterality Date   BYPASS GRAFT     HEMORRHOID SURGERY     HERNIA REPAIR     HIP SURGERY      reports that he has quit smoking. He has never used smokeless tobacco. He reports current alcohol use. He reports that he does not use drugs. Social History   Socioeconomic History   Marital status: Married    Spouse name: Not on file   Number of children: Not on file   Years of education: Not on file   Highest education level: Not on file  Occupational History   Not on file  Tobacco Use   Smoking status: Former   Smokeless tobacco: Never  Substance and Sexual Activity   Alcohol use: Yes    Comment: occasional   Drug use: No   Sexual activity: Not Currently  Other Topics Concern   Not on file  Social History Narrative   Social History     Tobacco Use       Smoking status: None       Smokeless tobacco: Never Used     Alcohol use: No       Alcohol/week: 0.0 standard drinks     Drug use: No   Diet is good   No caffeine   Married  since 1952   Lives in apartment, one story   Lives with full time home health   Pet- Cat (Tai)   Highest level of education: PHD   Past profession- University professor   Some exercise-upper arm      Living Will-Yes   DNR-Yes   POA/HPOA- Yes      Difficulty bathing, dressing, preparing, eating, managing medications, and managing finances.   No difficulty affording medications   Social Determinants of Health   Financial Resource Strain: Not on file  Food Insecurity: Not on file  Transportation Needs: Not on file  Physical Activity: Not on file  Stress: Not on file  Social Connections: Not on file  Intimate Partner Violence: Not on file    Functional Status Survey:    Family History  Problem Relation Age of Onset   Heart disease Mother    Cancer Father    ALS Brother    Heart disease Daughter     Health Maintenance  Topic Date Due    DTaP/Tdap/Td (1 - Tdap) 06/15/2004   Medicare Annual Wellness (AWV)  03/26/2020   INFLUENZA VACCINE  01/12/2022   COVID-19 Vaccine (7 - 2023-24 season) 02/12/2022   Pneumonia Vaccine 26+ Years old  Completed   Zoster Vaccines- Shingrix  Completed   HPV VACCINES  Aged Out    Allergies  Allergen Reactions   Cranberry Extract Cough   Shellfish Allergy Swelling and Rash    Outpatient Encounter Medications as of 05/18/2022  Medication Sig   Ascorbic Acid (VITAMIN C) 1000 MG tablet Take 1,000 mg by mouth in the morning and at bedtime.   aspirin 81 MG EC tablet in the morning and at bedtime.    cholecalciferol (VITAMIN D) 1000 UNITS tablet Take 1,000 Units by mouth daily.   docusate sodium (COLACE) 100 MG capsule Take 200 mg by mouth at bedtime.   levothyroxine (SYNTHROID) 50 MCG tablet Take 50 mcg by mouth daily before breakfast.   Multiple Vitamins-Minerals (PRESERVISION AREDS 2 PO) Take by mouth daily.   Multiple Vitamins-Minerals (THEREMS-M) TABS Take 1 tablet by mouth daily.   omega-3 acid ethyl esters (LOVAZA) 1 g capsule Take 1 g by mouth 2 (two) times daily.   Polyethyl Glycol-Propyl Glycol (SYSTANE) 0.4-0.3 % SOLN Place 2 drops into the left eye at bedtime.   polyethylene glycol (MIRALAX / GLYCOLAX) 17 g packet Take 17 g by mouth daily as needed.   potassium chloride (MICRO-K) 10 MEQ CR capsule Take 10 mEq by mouth daily.   saccharomyces boulardii (FLORASTOR) 250 MG capsule Take 250 mg by mouth daily.   torsemide (DEMADEX) 20 MG tablet Take 20 mg by mouth daily.   zolpidem (AMBIEN) 5 MG tablet Take 1 tablet (5 mg total) by mouth at bedtime. TAKE ONE TABLET AT BEDTIME   No facility-administered encounter medications on file as of 05/18/2022.    Review of Systems  Constitutional: Negative.   HENT: Negative.    Respiratory: Negative.    Cardiovascular:  Positive for leg swelling.  Gastrointestinal:  Positive for constipation.  Musculoskeletal:  Positive for gait problem.   Psychiatric/Behavioral: Negative.    All other systems reviewed and are negative.   There were no vitals filed for this visit. There is no height or weight on file to calculate BMI. Physical Exam Vitals and nursing note reviewed.  Constitutional:      Appearance: Normal appearance.  HENT:     Head: Normocephalic.     Mouth/Throat:     Mouth:  Mucous membranes are moist.     Pharynx: Oropharynx is clear.  Eyes:     Extraocular Movements: Extraocular movements intact.     Pupils: Pupils are equal, round, and reactive to light.  Cardiovascular:     Rate and Rhythm: Normal rate. Rhythm irregular.  Pulmonary:     Effort: Pulmonary effort is normal.     Breath sounds: Normal breath sounds.  Abdominal:     General: Bowel sounds are normal.     Palpations: Abdomen is soft.  Musculoskeletal:     Right lower leg: Edema present.     Left lower leg: Edema present.  Neurological:     General: No focal deficit present.     Mental Status: He is alert and oriented to person, place, and time.     Labs reviewed: Basic Metabolic Panel: Recent Labs    08/19/21 0134 11/05/21 0000  NA 136* 142  K 4.4 3.5  CL 99 103  CO2 27* 29*  BUN 25* 27*  CREATININE 1.4* 1.4*  CALCIUM 8.9 8.9   Liver Function Tests: Recent Labs    08/19/21 0134  AST 16  ALT 12  ALKPHOS 71  ALBUMIN 3.4*   No results for input(s): "LIPASE", "AMYLASE" in the last 8760 hours. No results for input(s): "AMMONIA" in the last 8760 hours. CBC: Recent Labs    08/19/21 0134  WBC 5.2  HGB 13.1*  HCT 38*  PLT 207   Cardiac Enzymes: No results for input(s): "CKTOTAL", "CKMB", "CKMBINDEX", "TROPONINI" in the last 8760 hours. BNP: Invalid input(s): "POCBNP" No results found for: "HGBA1C" Lab Results  Component Value Date   TSH 2.08 09/30/2021   No results found for: "VITAMINB12" No results found for: "FOLATE" No results found for: "IRON", "TIBC", "FERRITIN"  Imaging and Procedures obtained prior to SNF  admission: No results found.  Assessment/Plan 1. Nonrheumatic aortic valve stenosis Murmur has not changed since I examined him 2 months ago.  He denies any shortness of breath  2. Stage 3 chronic kidney disease, unspecified whether stage 3a or 3b CKD ([place]) Fattening is stable around 1.4.  He does take Demadex for the edema  3. Cerebrovascular accident (CVA) due to thrombosis of precerebral artery (HCC) Sided weakness is only residual  4. Insomnia, unspecified type Continues on Ambien  5. Paroxysmal atrial fibrillation (HCC) Not on rate controlling drugs or anticoagulants  6. Slow transit constipation No problem.  MiraLAX seems to help    Family/ staff Communication:   Labs/tests ordered:none  Lillette Boxer. [place], [place] [address] [place], [place] Office [phone]

## 2022-06-21 ENCOUNTER — Encounter: Payer: Self-pay | Admitting: Nurse Practitioner

## 2022-06-21 ENCOUNTER — Non-Acute Institutional Stay (SKILLED_NURSING_FACILITY): Payer: Medicare PPO | Admitting: Nurse Practitioner

## 2022-06-21 DIAGNOSIS — N183 Chronic kidney disease, stage 3 unspecified: Secondary | ICD-10-CM

## 2022-06-21 DIAGNOSIS — K5901 Slow transit constipation: Secondary | ICD-10-CM

## 2022-06-21 DIAGNOSIS — Z8719 Personal history of other diseases of the digestive system: Secondary | ICD-10-CM

## 2022-06-21 DIAGNOSIS — I2510 Atherosclerotic heart disease of native coronary artery without angina pectoris: Secondary | ICD-10-CM

## 2022-06-21 DIAGNOSIS — I6300 Cerebral infarction due to thrombosis of unspecified precerebral artery: Secondary | ICD-10-CM | POA: Diagnosis not present

## 2022-06-21 DIAGNOSIS — G4700 Insomnia, unspecified: Secondary | ICD-10-CM

## 2022-06-21 DIAGNOSIS — R350 Frequency of micturition: Secondary | ICD-10-CM | POA: Diagnosis not present

## 2022-06-21 DIAGNOSIS — I270 Primary pulmonary hypertension: Secondary | ICD-10-CM

## 2022-06-21 DIAGNOSIS — I10 Essential (primary) hypertension: Secondary | ICD-10-CM

## 2022-06-21 DIAGNOSIS — E039 Hypothyroidism, unspecified: Secondary | ICD-10-CM

## 2022-06-21 DIAGNOSIS — E785 Hyperlipidemia, unspecified: Secondary | ICD-10-CM

## 2022-06-21 NOTE — Assessment & Plan Note (Signed)
takes Torsemide, [place] 6/18/21negative DVT, last echo 2018 EF 55-60%

## 2022-06-21 NOTE — Assessment & Plan Note (Signed)
on ASA, Fish oil, Coronary artery bypass grafting 1992 

## 2022-06-21 NOTE — Assessment & Plan Note (Signed)
Blood pressure is controlled, on Torsemide.

## 2022-06-21 NOTE — Assessment & Plan Note (Signed)
Stable,  takes Zolpidem

## 2022-06-21 NOTE — Assessment & Plan Note (Signed)
takes Levothyroxine, TSH 2.08 09/30/21 

## 2022-06-21 NOTE — Assessment & Plan Note (Signed)
no difference since off Mirabegran due to cost, had urinary retention, s/p ED eval 11/22/20, f/u urology 

## 2022-06-21 NOTE — Assessment & Plan Note (Signed)
Hgb 13.1 08/19/21

## 2022-06-21 NOTE — Progress Notes (Signed)
Location:  [facility] Room Number: NO/64/A Place of Service:  Nursing (32) Provider:  Shanyla Marconi X, NP  Patient Care Team: Bravlio Luca X, NP as PCP - General (Internal Medicine) Belva Crome, MD as PCP - Cardiology (Cardiology)  Extended Emergency Contact Information Primary Emergency Contact: Carson Myrtle Address: [address]          [place], [place] Phone: [phone] Relation: Spouse Secondary Emergency Contact: Alan Ripper Address: [place], [place] [address] [place] Phone: [phone] Relation: Daughter  Code Status:  DNR Goals of care: Advanced Directive information    06/22/2022   11:47 AM  Advanced Directives  Does Patient Have a Medical Advance Directive? Yes  Type of Advance Directive Living will;Out of facility DNR (pink MOST or yellow form)  Does patient want to make changes to medical advance directive? No - Patient declined  Pre-existing out of facility DNR order (yellow form or pink MOST form) Pink MOST form placed in chart (order not valid for inpatient use);Yellow form placed in chart (order not valid for inpatient use)     Chief Complaint  Patient presents with   Medical Management of Chronic Issues    Patient is here for a follow up for chronic conditions    Quality Metric Gaps    Patient is due for AWV   Immunizations    Patient is due for updated TD or Tdap  HPI:  Pt is a 89 y.o. male seen today for medical management of chronic diseases.     Afib, tachy brady, refused PPM, heart rate is in control. EKG 12/05/19 Afib at cardiology, only takes ASA             Thrombophilia, had work up in the past, venous US negative DVT in legs.               Urinary frequency, no difference since off Mirabegran due to cost, had urinary retention, s/p ED eval 11/22/20, f/u urology             CAD, on ASA, Fish oil, Coronary artery bypass grafting 1992             Hx of GI  bleed, Hgb 13.1 08/19/21             CVA, slightly left sided weakness with muscle strength 5/5             HTN, controlled, on Torsemide.  Pulmonary hypertension/edema BLE, takes Torsemide, [place] 6/18/21negative DVT, last echo 2018 EF 55-60% Insomnia, takes Zolpidem             Hypothyroidism, takes Levothyroxine, TSH 2.08 09/30/21             CKD Bun/creat 27/1.4 11/05/21             Hyperlipidemia, LDL 107 05/05/20, on Omega 3 supplement             Constipation, takes MiraLax, Colace.    Past Medical History:  Diagnosis Date   Atrial fibrillation (HCC)    CAD (coronary artery disease)    Diverticulosis    Hypercholesteremia    Hypertension    Hypothyroidism    Insomnia    Murmur    Paroxysmal atrial fibrillation (HCC)    Urinary dysfunction    Past Surgical History:  Procedure Laterality Date   BYPASS GRAFT  HEMORRHOID SURGERY     HERNIA REPAIR     HIP SURGERY      Allergies  Allergen Reactions   Cranberry Extract Cough   Shellfish Allergy Swelling and Rash    Outpatient Encounter Medications as of 06/21/2022  Medication Sig   Ascorbic Acid (VITAMIN C) 1000 MG tablet Take 1,000 mg by mouth in the morning and at bedtime.   aspirin 81 MG EC tablet in the morning and at bedtime.    cholecalciferol (VITAMIN D) 1000 UNITS tablet Take 1,000 Units by mouth daily.   docusate sodium (COLACE) 100 MG capsule Take 200 mg by mouth at bedtime.   levothyroxine (SYNTHROID) 50 MCG tablet Take 50 mcg by mouth daily before breakfast.   Multiple Vitamins-Minerals (PRESERVISION AREDS 2 PO) Take by mouth daily.   Multiple Vitamins-Minerals (THEREMS-M) TABS Take 1 tablet by mouth daily.   omega-3 acid ethyl esters (LOVAZA) 1 g capsule Take 1 g by mouth 2 (two) times daily.   Polyethyl Glycol-Propyl Glycol (SYSTANE) 0.4-0.3 % SOLN Place 2 drops into the left eye at bedtime.   polyethylene glycol (MIRALAX / GLYCOLAX) 17 g packet Take 17 g by mouth daily as needed.   potassium chloride  (MICRO-K) 10 MEQ CR capsule Take 10 mEq by mouth daily.   saccharomyces boulardii (FLORASTOR) 250 MG capsule Take 250 mg by mouth daily.   torsemide (DEMADEX) 20 MG tablet Take 20 mg by mouth daily.   zolpidem (AMBIEN) 5 MG tablet Take 1 tablet (5 mg total) by mouth at bedtime. TAKE ONE TABLET AT BEDTIME   No facility-administered encounter medications on file as of 06/21/2022.    Review of Systems  Constitutional:  Negative for activity change, appetite change and fever.  HENT:  Positive for hearing loss and trouble swallowing. Negative for congestion and voice change.   Respiratory:  Positive for shortness of breath. Negative for cough and wheezing.        Occasional nocturnal orthopnea at his baseline.   Cardiovascular:  Positive for leg swelling. Negative for chest pain and palpitations.  Gastrointestinal:  Negative for abdominal pain and constipation.  Genitourinary:  Positive for frequency. Negative for difficulty urinating and urgency.  Musculoskeletal:  Positive for arthralgias and gait problem.  Neurological:  Negative for speech difficulty, weakness and headaches.  Psychiatric/Behavioral:  Negative for confusion and sleep disturbance. The patient is not nervous/anxious.     Immunization History  Administered Date(s) Administered   Influenza, High Dose Seasonal PF 04/11/2017, 03/26/2020   Influenza-Unspecified 03/26/2020, 04/01/2021, 04/12/2022   Moderna SARS-COV2 Booster Vaccination 04/06/2020   Moderna Sars-Covid-2 Vaccination 06/18/2019, 07/16/2019, 10/30/2021   PFIZER(Purple Top)SARS-COV-2 Vaccination 09/14/2020, 03/03/2021   Pfizer Covid-19 Vaccine Bivalent Booster 17yr & up 04/15/2022   Pneumococcal-Unspecified 08/12/2012   Tetanus 06/14/2004   Zoster Recombinat (Shingrix) 02/03/2017, 06/02/2017   Pertinent  Health Maintenance Due  Topic Date Due   INFLUENZA VACCINE  Completed      09/18/2020    3:37 PM 11/22/2020   10:54 AM 04/02/2022   10:16 AM 06/21/2022   12:27  PM 06/22/2022   11:47 AM  Fall Risk  Falls in the past year? 0   0 0  Was there an injury with Fall? 0  0 0 0  Fall Risk Category Calculator 0   0 0  Fall Risk Category Low   Low Low  Patient Fall Risk Level Low fall risk Low fall risk Low fall risk Low fall risk Low fall risk  Patient at Risk for  Falls Due to   Impaired balance/gait;Impaired mobility;History of fall(s) History of fall(s) History of fall(s)  Fall risk Follow up   Falls evaluation completed  Falls evaluation completed   Functional Status Survey:    Vitals:   06/21/22 1223  BP: (!) 108/58  Pulse: 74  Resp: 18  Temp: (!) 97.3 F (36.3 C)  SpO2: 99%  Weight: 157 lb 4.8 oz (71.4 kg)  Height: '5\' 4"'$  (1.626 m)   Body mass index is 27 kg/m. Physical Exam Vitals and nursing note reviewed.  Constitutional:      Appearance: Normal appearance.  HENT:     Head: Normocephalic and atraumatic.     Mouth/Throat:     Mouth: Mucous membranes are moist.  Eyes:     Extraocular Movements: Extraocular movements intact.     Conjunctiva/sclera: Conjunctivae normal.     Pupils: Pupils are equal, round, and reactive to light.     Comments: Left lower eyelid ectropion.   Cardiovascular:     Rate and Rhythm: Normal rate. Rhythm irregular.     Heart sounds: Murmur heard.  Pulmonary:     Effort: Pulmonary effort is normal.     Breath sounds: Rales present.     Comments: Bibasilar rales.  Abdominal:     General: Bowel sounds are normal.     Palpations: Abdomen is soft.     Tenderness: There is no abdominal tenderness.  Musculoskeletal:     Cervical back: Normal range of motion and neck supple.     Right lower leg: Edema present.     Left lower leg: Edema present.     Comments: 1-2 + edema BLE L>R, uses TED sometimes.  Skin:    General: Skin is warm and dry.  Neurological:     General: No focal deficit present.     Mental Status: He is alert and oriented to person, place, and time. Mental status is at baseline.     Motor:  Weakness present.     Coordination: Coordination normal.     Gait: Gait abnormal.     Comments: Left sided weakness form previous CVA, muscle strength 5/5  Psychiatric:        Mood and Affect: Mood normal.        Behavior: Behavior normal.        Thought Content: Thought content normal.        Judgment: Judgment normal.     Labs reviewed: Recent Labs    08/19/21 0134 11/05/21 0000  NA 136* 142  K 4.4 3.5  CL 99 103  CO2 27* 29*  BUN 25* 27*  CREATININE 1.4* 1.4*  CALCIUM 8.9 8.9   Recent Labs    08/19/21 0134  AST 16  ALT 12  ALKPHOS 71  ALBUMIN 3.4*   Recent Labs    08/19/21 0134  WBC 5.2  HGB 13.1*  HCT 38*  PLT 207   Lab Results  Component Value Date   TSH 2.08 09/30/2021   No results found for: "HGBA1C" Lab Results  Component Value Date   CHOL 183 05/05/2020   HDL 54 05/05/2020   LDLCALC 107 (H) 05/05/2020   TRIG 128 05/05/2020   CHOLHDL 3.4 05/05/2020    Significant Diagnostic Results in last 30 days:  No results found.  Assessment/Plan Urinary frequency no difference since off Mirabegran due to cost, had urinary retention, s/p ED eval 11/22/20, f/u urology  CAD (coronary artery disease) on ASA, Fish oil, Coronary artery bypass grafting 1992  History of GI bleed Hgb 13.1 08/19/21  CVA (cerebral vascular accident) ([place]) slightly left sided weakness with muscle strength 5/5  Hypertension Blood pressure is controlled, on Torsemide.   Pulmonary hypertension, primary ([place]) takes Torsemide, [place] 6/18/21negative DVT, last echo 2018 EF 55-60%  Insomnia Stable,  takes Zolpidem  Hypothyroidism  takes Levothyroxine, TSH 2.08 09/30/21  CKD (chronic kidney disease) stage 3, GFR 30-59 ml/min (HCC) Bun/creat 27/1.4 11/05/21  Hyperlipidemia LDL 107 05/05/20, on Omega 3 supplement  Slow transit constipation Stable, takes MiraLax, Colace.   Due: Tdap if HPOA consents.   Family/ staff Communication: plan of care reviewed with the patient and  charge nurse.   Labs/tests ordered:  none  Time spend 35 minutes.

## 2022-06-21 NOTE — Assessment & Plan Note (Signed)
Stable, takes MiraLax, Colace.

## 2022-06-21 NOTE — Assessment & Plan Note (Signed)
slightly left sided weakness with muscle strength 5/5 

## 2022-06-21 NOTE — Assessment & Plan Note (Signed)
LDL 107 05/05/20, on Omega 3 supplement

## 2022-06-21 NOTE — Assessment & Plan Note (Signed)
Bun/creat 27/1.4 11/05/21

## 2022-06-22 ENCOUNTER — Non-Acute Institutional Stay (SKILLED_NURSING_FACILITY): Payer: Medicare PPO | Admitting: Nurse Practitioner

## 2022-06-22 ENCOUNTER — Encounter: Payer: Self-pay | Admitting: Nurse Practitioner

## 2022-06-22 DIAGNOSIS — I6300 Cerebral infarction due to thrombosis of unspecified precerebral artery: Secondary | ICD-10-CM | POA: Diagnosis not present

## 2022-06-22 DIAGNOSIS — Z0000 Encounter for general adult medical examination without abnormal findings: Secondary | ICD-10-CM

## 2022-06-22 NOTE — Progress Notes (Signed)
Subjective:   Billy Barnes is a 89 y.o. male who presents for Medicare Annual/Subsequent preventive examination @ SNF [facility].  Cardiac Risk Factors include: advanced age (>37mn, >>42women);male gender;sedentary lifestyle     Objective:    Today's Vitals   06/22/22 1148 06/22/22 1638  BP: (!) 108/58   Pulse: 74   Resp: 18   Temp: (!) 97.3 F (36.3 C)   SpO2: 99%   Weight: 157 lb (71.2 kg)   Height: '5\' 4"'$  (1.626 m)   PainSc: 0-No pain 2    Body mass index is 26.95 kg/m.     06/22/2022   11:47 AM 06/21/2022   12:25 PM 04/02/2022   10:17 AM 02/16/2022    4:04 PM 01/29/2022   10:59 AM 12/25/2021   11:58 AM 11/19/2021    3:12 PM  Advanced Directives  Does Patient Have a Medical Advance Directive? Yes Yes Yes Yes Yes Yes Yes  Type of Advance Directive Living will;Out of facility DNR (pink MOST or yellow form) Living will;Out of facility DNR (pink MOST or yellow form) [place]Living will;Out of facility DNR (pink MOST or yellow form) [place] will;Out of facility DNR (pink MOST or yellow form) Living will;Out of facility DNR (pink MOST or yellow form) Living will;Out of facility DNR (pink MOST or yellow form) Living will;Out of facility DNR (pink MOST or yellow form)  Does patient want to make changes to medical advance directive? No - Patient declined No - Patient declined No - Patient declined No - Patient declined No - Patient declined No - Patient declined No - Patient declined  Copy of [place] Chart?   Yes - validated most recent copy scanned in chart (See row information) Yes - validated most recent copy scanned in chart (See row information)     Pre-existing out of facility DNR order (yellow form or pink MOST form) Pink MOST form placed in chart (order not valid for inpatient use);Yellow form placed in chart (order not valid for inpatient use) Pink MOST form placed in chart (order not valid for  inpatient use);Yellow form placed in chart (order not valid for inpatient use)         Current Medications (verified) Outpatient Encounter Medications as of 06/22/2022  Medication Sig   Ascorbic Acid (VITAMIN C) 1000 MG tablet Take 1,000 mg by mouth in the morning and at bedtime.   aspirin 81 MG EC tablet in the morning and at bedtime.    cholecalciferol (VITAMIN D) 1000 UNITS tablet Take 1,000 Units by mouth daily.   docusate sodium (COLACE) 100 MG capsule Take 200 mg by mouth at bedtime.   levothyroxine (SYNTHROID) 50 MCG tablet Take 50 mcg by mouth daily before breakfast.   Multiple Vitamins-Minerals (PRESERVISION AREDS 2 PO) Take by mouth daily.   Multiple Vitamins-Minerals (THEREMS-M) TABS Take 1 tablet by mouth daily.   omega-3 acid ethyl esters (LOVAZA) 1 g capsule Take 1 g by mouth 2 (two) times daily.   Polyethyl Glycol-Propyl Glycol (SYSTANE) 0.4-0.3 % SOLN Place 2 drops into the left eye at bedtime.   polyethylene glycol (MIRALAX / GLYCOLAX) 17 g packet Take 17 g by mouth daily as needed.   potassium chloride (MICRO-K) 10 MEQ CR capsule Take 10 mEq by mouth daily.   saccharomyces boulardii (FLORASTOR) 250 MG capsule Take 250 mg by mouth daily.   torsemide (DEMADEX) 20 MG tablet Take 20 mg by mouth daily.  zolpidem (AMBIEN) 5 MG tablet Take 1 tablet (5 mg total) by mouth at bedtime. TAKE ONE TABLET AT BEDTIME   No facility-administered encounter medications on file as of 06/22/2022.    Allergies (verified) Cranberry extract and Shellfish allergy   History: Past Medical History:  Diagnosis Date   Atrial fibrillation (HCC)    CAD (coronary artery disease)    Diverticulosis    Hypercholesteremia    Hypertension    Hypothyroidism    Insomnia    Murmur    Paroxysmal atrial fibrillation (HCC)    Urinary dysfunction    Past Surgical History:  Procedure Laterality Date   BYPASS GRAFT     HEMORRHOID SURGERY     HERNIA REPAIR     HIP SURGERY     Family History  Problem  Relation Age of Onset   Heart disease Mother    Cancer Father    ALS Brother    Heart disease Daughter    Social History   Socioeconomic History   Marital status: Married    Spouse name: Not on file   Number of children: Not on file   Years of education: Not on file   Highest education level: Not on file  Occupational History   Not on file  Tobacco Use   Smoking status: Former   Smokeless tobacco: Never  Substance and Sexual Activity   Alcohol use: Yes    Comment: occasional   Drug use: No   Sexual activity: Not Currently  Other Topics Concern   Not on file  Social History Narrative   Social History     Tobacco Use       Smoking status: None       Smokeless tobacco: Never Used     Alcohol use: No       Alcohol/week: 0.0 standard drinks     Drug use: No   Diet is good   No caffeine   Married since [place] in apartment, one story   Lives with full time home health   Pet- Cat (Tai)   Highest level of education: PHD   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   No difficulty affording medications   Social Determinants of Health   Financial Resource Strain: Not on file  Food Insecurity: Not on file  Transportation Needs: Not on file  Physical Activity: Not on file  Stress: Not on file  Social Connections: Not on file    Tobacco Counseling Counseling given: Not Answered   Clinical Intake:  Pre-visit preparation completed: Yes  Pain : 0-10 Pain Score: 2  Pain Location: Back Pain Orientation: Lower Pain Descriptors / Indicators: Aching Pain Onset: More than a month ago Pain Frequency: Several days a week     BMI - recorded: 26.95 Nutritional Status: BMI 25 -29 Overweight Nutritional Risks: None Diabetes: No  How often do you need to have someone help you when you read  instructions, pamphlets, or other written materials from your doctor or pharmacy?: 4 - Often What is the last grade level you completed in school?: college  Diabetic?no  Interpreter Needed?: No  Information entered by :: Breyon Blass Bretta Bang NP   Activities of Daily Living    06/22/2022    4:40 PM  In your present state of health, do  you have any difficulty performing the following activities:  Hearing? 1  Comment hearing aids  Vision? 0  Difficulty concentrating or making decisions? 1  Walking or climbing stairs? 1  Dressing or bathing? 1  Doing errands, shopping? 1  Preparing Food and eating ? N  Using the Toilet? N  In the past six months, have you accidently leaked urine? Y  Do you have problems with loss of bowel control? Y  Managing your Medications? Y  Managing your Finances? Y  Housekeeping or managing your Housekeeping? Y    Patient Care Team: Aanika Defoor X, NP as PCP - General (Internal Medicine) Belva Crome, MD as PCP - Cardiology (Cardiology)  Indicate any recent Medical Services you may have received from other than [facility] providers in the past year (date may be approximate).     Assessment:   This is a routine wellness examination for [place].  Hearing/Vision screen No results found.  Dietary issues and exercise activities discussed: Current Exercise Habits: The patient does not participate in regular exercise at present, Exercise limited by: cardiac condition(s);neurologic condition(s);orthopedic condition(s)   Goals Addressed             This Visit's Progress    LIFESTYLE - DECREASE FALLS RISK         Depression Screen    04/02/2022   10:17 AM  PHQ 2/9 Scores  PHQ - 2 Score 0    Fall Risk    06/22/2022   11:47 AM 06/21/2022   12:27 PM 04/02/2022   10:16 AM 09/18/2020    3:37 PM 08/12/2020    1:19 PM  Fall Risk   Falls in the past year? 0 0  0 0  Number falls in past yr: 0 0 0 0 0  Injury with Fall? 0 0 0 0   Risk for fall due to : History of  fall(s) History of fall(s) Impaired balance/gait;Impaired mobility;History of fall(s)    Follow up Falls evaluation completed  Falls evaluation completed      [place]:  Any stairs in or around the home? Yes  If so, are there any without handrails? No  Home free of loose throw rugs in walkways, pet beds, electrical cords, etc? Yes  Adequate lighting in your home to reduce risk of falls? Yes   ASSISTIVE DEVICES UTILIZED TO PREVENT FALLS:  Life alert? No  Use of a cane, walker or w/c? Yes  Grab bars in the bathroom? Yes  Shower chair or bench in shower? Yes  Elevated toilet seat or a handicapped toilet? Yes   TIMED UP AND GO:  Was the test performed? No . Wheelchair for mobility    Cognitive Function: MMSE 22/30        Immunizations Immunization History  Administered Date(s) Administered   Influenza, High Dose Seasonal PF 04/11/2017, 03/26/2020   Influenza-Unspecified 03/26/2020, 04/01/2021, 04/12/2022   Moderna SARS-COV2 Booster Vaccination 04/06/2020   Moderna Sars-Covid-2 Vaccination 06/18/2019, 07/16/2019, 10/30/2021   PFIZER(Purple Top)SARS-COV-2 Vaccination 09/14/2020, 03/03/2021   Pfizer Covid-19 Vaccine Bivalent Booster 95yr & up 04/15/2022   Pneumococcal-Unspecified 08/12/2012   Tetanus 06/14/2004   Zoster Recombinat (Shingrix) 02/03/2017, 06/02/2017    TDAP status: Up to date  Flu Vaccine status: Up to date  Pneumococcal vaccine status: Up to date  Covid-19 vaccine status: Completed vaccines  Qualifies for Shingles Vaccine? Yes   Zostavax completed Yes   Shingrix Completed?: Yes  Screening Tests Health Maintenance  Topic Date Due  COVID-19 Vaccine (8 - 2023-24 season) 06/10/2022   Medicare Annual Wellness (AWV)  06/23/2023   Pneumonia Vaccine 39+ Years old  Completed   INFLUENZA VACCINE  Completed   Zoster Vaccines- Shingrix  Completed   HPV VACCINES  Aged Out   DTaP/Tdap/Td  Discontinued    Health  Maintenance  Health Maintenance Due  Topic Date Due   COVID-19 Vaccine (8 - 2023-24 season) 06/10/2022    Colorectal cancer screening: No longer required.   Lung Cancer Screening: (Low Dose CT Chest recommended if Age 80-80 years, 30 pack-year currently smoking OR have quit w/in 15years.) does not qualify.     Additional Screening:  Hepatitis C Screening: does not qualify;   Vision Screening: Recommended annual ophthalmology exams for early detection of glaucoma and other disorders of the eye. Is the patient up to date with their annual eye exam?  No  Who is the provider or what is the name of the office in which the patient attends annual eye exams? HPOA will provide If pt is not established with a provider, would they like to be referred to a provider to establish care? No .   Dental Screening: Recommended annual dental exams for proper oral hygiene  Community Resource Referral / Chronic Care Management: CRR required this visit?  No   CCM required this visit?  No      Plan:   Eye exam if HPOA desires.   I have personally reviewed and noted the following in the patient's chart:   Medical and social history Use of alcohol, tobacco or illicit drugs  Current medications and supplements including opioid prescriptions. Patient is not currently taking opioid prescriptions. Functional ability and status Nutritional status Physical activity Advanced directives List of other physicians Hospitalizations, surgeries, and ER visits in previous 12 months Vitals Screenings to include cognitive, depression, and falls Referrals and appointments  In addition, I have reviewed and discussed with patient certain preventive protocols, quality metrics, and best practice recommendations. A written personalized care plan for preventive services as well as general preventive health recommendations were provided to patient.     Cameron Katayama X Falesha Schommer, NP   06/22/2022

## 2022-07-20 ENCOUNTER — Encounter: Payer: Self-pay | Admitting: Nurse Practitioner

## 2022-07-20 ENCOUNTER — Non-Acute Institutional Stay (SKILLED_NURSING_FACILITY): Payer: Medicare PPO | Admitting: Nurse Practitioner

## 2022-07-20 DIAGNOSIS — I10 Essential (primary) hypertension: Secondary | ICD-10-CM

## 2022-07-20 DIAGNOSIS — I480 Paroxysmal atrial fibrillation: Secondary | ICD-10-CM | POA: Diagnosis not present

## 2022-07-20 DIAGNOSIS — N183 Chronic kidney disease, stage 3 unspecified: Secondary | ICD-10-CM

## 2022-07-20 DIAGNOSIS — G4700 Insomnia, unspecified: Secondary | ICD-10-CM

## 2022-07-20 DIAGNOSIS — I2510 Atherosclerotic heart disease of native coronary artery without angina pectoris: Secondary | ICD-10-CM | POA: Diagnosis not present

## 2022-07-20 DIAGNOSIS — I270 Primary pulmonary hypertension: Secondary | ICD-10-CM

## 2022-07-20 DIAGNOSIS — E039 Hypothyroidism, unspecified: Secondary | ICD-10-CM

## 2022-07-20 DIAGNOSIS — I6300 Cerebral infarction due to thrombosis of unspecified precerebral artery: Secondary | ICD-10-CM

## 2022-07-20 DIAGNOSIS — R350 Frequency of micturition: Secondary | ICD-10-CM | POA: Diagnosis not present

## 2022-07-20 NOTE — Assessment & Plan Note (Signed)
Pulmonary hypertension/edema BLE, takes Torsemide, [place] 6/18/21negative DVT, last echo 2018 EF 55-60%

## 2022-07-20 NOTE — Assessment & Plan Note (Signed)
takes Zolpidem.  ?

## 2022-07-20 NOTE — Assessment & Plan Note (Signed)
tachy brady, refused PPM, heart rate is in control. EKG 12/05/19 Afib at cardiology, only takes ASA

## 2022-07-20 NOTE — Assessment & Plan Note (Signed)
on ASA, Fish oil, Coronary artery bypass grafting 1992 

## 2022-07-20 NOTE — Assessment & Plan Note (Signed)
CVA, slightly left sided weakness with muscle strength 5/5

## 2022-07-20 NOTE — Assessment & Plan Note (Signed)
takes Levothyroxine, TSH 2.08 09/30/21 

## 2022-07-20 NOTE — Assessment & Plan Note (Signed)
Bun/creat 27/1.4 11/05/21

## 2022-07-20 NOTE — Progress Notes (Signed)
Location:  [facility] Room Number: 57 Place of Service:  SNF (31) Provider:  Atha Mcbain X, NP  Patient Care Team: Estephania Licciardi X, NP as PCP - General (Internal Medicine) Belva Crome, MD (Inactive) as PCP - Cardiology (Cardiology)  Extended Emergency Contact Information Primary Emergency Contact: Mcclaren,Jean Address: [address]          [place], [place] Phone: [phone] Relation: Spouse Secondary Emergency Contact: Alan Ripper Address: [place], [place] [address] [place] Phone: [phone] Relation: Daughter  Code Status:  DNR  Goals of care: Advanced Directive information    07/20/2022   10:41 AM  Advanced Directives  Does Patient Have a Medical Advance Directive? Yes  Type of Advance Directive Living will;Out of facility DNR (pink MOST or yellow form)  Does patient want to make changes to medical advance directive? No - Patient declined  Pre-existing out of facility DNR order (yellow form or pink MOST form) Pink MOST form placed in chart (order not valid for inpatient use);Yellow form placed in chart (order not valid for inpatient use)     Chief Complaint  Patient presents with   Medical Management of Chronic Issues    Routine Visit   Quality Metric Gaps    Covid Vaccine    HPI:  Pt is a 89 y.o. male seen today for medical management of chronic diseases.      Afib, tachy brady, refused PPM, heart rate is in control. EKG 12/05/19 Afib at cardiology, only takes ASA             Thrombophilia, had work up in the past, venous US negative DVT in legs.               Urinary frequency, no difference since off Mirabegran due to cost, had urinary retention, s/p ED eval 11/22/20, f/u urology             CAD, on ASA, Fish oil, Coronary artery bypass grafting 1992             Hx of GI bleed, Hgb 13.1 08/19/21             CVA, slightly left sided weakness with muscle strength 5/5              HTN, controlled, on Torsemide.  Pulmonary hypertension/edema BLE, takes Torsemide, [place] 6/18/21negative DVT, last echo 2018 EF 55-60% Insomnia, takes Zolpidem             Hypothyroidism, takes Levothyroxine, TSH 2.08 09/30/21             CKD Bun/creat 27/1.4 11/05/21             Hyperlipidemia, LDL 107 05/05/20, on Omega 3 supplement             Constipation, takes MiraLax, Colace.      Past Medical History:  Diagnosis Date   Atrial fibrillation (HCC)    CAD (coronary artery disease)    Diverticulosis    Hypercholesteremia    Hypertension    Hypothyroidism    Insomnia    Murmur    Paroxysmal atrial fibrillation (HCC)    Urinary dysfunction    Past Surgical History:  Procedure Laterality Date   BYPASS GRAFT     HEMORRHOID SURGERY     HERNIA REPAIR     HIP SURGERY  Allergies  Allergen Reactions   Cranberry Extract Cough   Shellfish Allergy Swelling and Rash    Outpatient Encounter Medications as of 07/20/2022  Medication Sig   Ascorbic Acid (VITAMIN C) 1000 MG tablet Take 1,000 mg by mouth in the morning and at bedtime.   aspirin 81 MG EC tablet in the morning and at bedtime.    cholecalciferol (VITAMIN D) 1000 UNITS tablet Take 1,000 Units by mouth daily.   docusate sodium (COLACE) 100 MG capsule Take 200 mg by mouth at bedtime.   levothyroxine (SYNTHROID) 50 MCG tablet Take 50 mcg by mouth daily before breakfast.   Multiple Vitamins-Minerals (PRESERVISION AREDS 2 PO) Take by mouth daily.   Multiple Vitamins-Minerals (THEREMS-M) TABS Take 1 tablet by mouth daily.   nystatin (MYCOSTATIN/NYSTOP) powder Apply 1 Application topically 2 (two) times daily. Apply to (L) groin area topically two times a day related to [place]   omega-3 acid ethyl esters (LOVAZA) 1 g capsule Take 1 g by mouth 2 (two) times daily.   Polyethyl Glycol-Propyl Glycol (SYSTANE) 0.4-0.3 % SOLN Place 2 drops into the left eye at bedtime.   polyethylene glycol  (MIRALAX / GLYCOLAX) 17 g packet Take 17 g by mouth daily as needed.   potassium chloride (MICRO-K) 10 MEQ CR capsule Take 10 mEq by mouth daily.   saccharomyces boulardii (FLORASTOR) 250 MG capsule Take 250 mg by mouth daily.   torsemide (DEMADEX) 20 MG tablet Take 20 mg by mouth daily.   zolpidem (AMBIEN) 5 MG tablet Take 1 tablet (5 mg total) by mouth at bedtime. TAKE ONE TABLET AT BEDTIME   No facility-administered encounter medications on file as of 07/20/2022.    Review of Systems  Constitutional:  Negative for activity change, appetite change and fever.  HENT:  Positive for hearing loss and trouble swallowing. Negative for congestion and voice change.   Respiratory:  Positive for shortness of breath. Negative for cough and wheezing.        Occasional nocturnal orthopnea at his baseline.   Cardiovascular:  Positive for leg swelling. Negative for chest pain and palpitations.  Gastrointestinal:  Negative for abdominal pain and constipation.  Genitourinary:  Positive for frequency. Negative for difficulty urinating and urgency.  Musculoskeletal:  Positive for arthralgias and gait problem.  Skin:  Positive for rash.  Neurological:  Negative for speech difficulty, weakness and headaches.  Psychiatric/Behavioral:  Negative for confusion and sleep disturbance. The patient is not nervous/anxious.     Immunization History  Administered Date(s) Administered   Influenza, High Dose Seasonal PF 04/11/2017, 03/26/2020   Influenza-Unspecified 03/26/2020, 04/01/2021, 04/12/2022   Moderna SARS-COV2 Booster Vaccination 04/06/2020   Moderna Sars-Covid-2 Vaccination 06/18/2019, 07/16/2019, 10/30/2021   PFIZER(Purple Top)SARS-COV-2 Vaccination 09/14/2020, 03/03/2021   Pfizer Covid-19 Vaccine Bivalent Booster 57yr & up 04/15/2022   Pneumococcal-Unspecified 08/12/2012   Tetanus 06/14/2004   Zoster Recombinat (Shingrix) 02/03/2017, 06/02/2017   Pertinent  Health Maintenance Due  Topic Date Due    INFLUENZA VACCINE  Completed      11/22/2020   10:54 AM 04/02/2022   10:16 AM 06/21/2022   12:27 PM 06/22/2022   11:47 AM 07/20/2022   10:38 AM  Fall Risk  Falls in the past year?   0 0 0  Was there an injury with Fall?  0 0 0 0  Fall Risk Category Calculator   0 0 0  Fall Risk Category (Retired)   Low Low   (RETIRED) Patient Fall Risk Level Low  fall risk Low fall risk Low fall risk Low fall risk   Patient at Risk for Falls Due to  Impaired balance/gait;Impaired mobility;History of fall(s) History of fall(s) History of fall(s) History of fall(s)  Fall risk Follow up  Falls evaluation completed  Falls evaluation completed Falls evaluation completed   Functional Status Survey:    Vitals:   07/20/22 1027  BP: (!) 108/58  Pulse: 74  Resp: 18  Temp: (!) 97.3 F (36.3 C)  SpO2: 99%  Weight: 161 lb 3 oz (73.1 kg)  Height: '5\' 4"'$  (1.626 m)   Body mass index is 27.67 kg/m. Physical Exam Vitals and nursing note reviewed.  Constitutional:      Appearance: Normal appearance.  HENT:     Head: Normocephalic and atraumatic.     Mouth/Throat:     Mouth: Mucous membranes are moist.  Eyes:     Extraocular Movements: Extraocular movements intact.     Conjunctiva/sclera: Conjunctivae normal.     Pupils: Pupils are equal, round, and reactive to light.     Comments: Left lower eyelid ectropion.   Cardiovascular:     Rate and Rhythm: Normal rate. Rhythm irregular.     Heart sounds: Murmur heard.  Pulmonary:     Effort: Pulmonary effort is normal.     Breath sounds: Rales present.     Comments: Bibasilar rales.  Abdominal:     General: Bowel sounds are normal.     Palpations: Abdomen is soft.     Tenderness: There is no abdominal tenderness.  Musculoskeletal:     Cervical back: Normal range of motion and neck supple.     Right lower leg: Edema present.     Left lower leg: Edema present.     Comments: 1-2 + edema BLE L>R, uses TED sometimes.  Skin:    General: Skin is warm and dry.      Findings: Rash present.     Comments: Left groin redness.   Neurological:     General: No focal deficit present.     Mental Status: He is alert and oriented to person, place, and time. Mental status is at baseline.     Motor: Weakness present.     Coordination: Coordination normal.     Gait: Gait abnormal.     Comments: Left sided weakness form previous CVA, muscle strength 5/5  Psychiatric:        Mood and Affect: Mood normal.        Behavior: Behavior normal.        Thought Content: Thought content normal.        Judgment: Judgment normal.     Labs reviewed: Recent Labs    08/19/21 0134 11/05/21 0000  NA 136* 142  K 4.4 3.5  CL 99 103  CO2 27* 29*  BUN 25* 27*  CREATININE 1.4* 1.4*  CALCIUM 8.9 8.9   Recent Labs    08/19/21 0134  AST 16  ALT 12  ALKPHOS 71  ALBUMIN 3.4*   Recent Labs    08/19/21 0134  WBC 5.2  HGB 13.1*  HCT 38*  PLT 207   Lab Results  Component Value Date   TSH 2.08 09/30/2021   No results found for: "HGBA1C" Lab Results  Component Value Date   CHOL 183 05/05/2020   HDL 54 05/05/2020   LDLCALC 107 (H) 05/05/2020   TRIG 128 05/05/2020   CHOLHDL 3.4 05/05/2020    Significant Diagnostic Results in last 30 days:  No  results found.  Assessment/Plan Paroxysmal atrial fibrillation tachy brady, refused PPM, heart rate is in control. EKG 12/05/19 Afib at cardiology, only takes ASA  Urinary frequency  Urinary frequency, no difference since off Mirabegran due to cost, had urinary retention, s/p ED eval 11/22/20, f/u urology  CAD (coronary artery disease) on ASA, Fish oil, Coronary artery bypass grafting 1992  CVA (cerebral vascular accident) ([place])  CVA, slightly left sided weakness with muscle strength 5/5  Hypertension Blood pressure is  controlled, on Torsemide.  Pulmonary hypertension, primary ([place]) Pulmonary hypertension/edema BLE, takes Torsemide, [place] 6/18/21negative DVT, last echo 2018 EF 55-60%  Insomnia  takes  Zolpidem  Hypothyroidism  takes Levothyroxine, TSH 2.08 09/30/21  CKD (chronic kidney disease) stage 3, GFR 30-59 ml/min ([place]) Bun/creat 27/1.4 11/05/21     Family/ staff Communication: plan of care reviewed with the patient and charge nurse.   Labs/tests ordered:  none  Time spend 35 minutes.

## 2022-07-20 NOTE — Assessment & Plan Note (Signed)
Urinary frequency, no difference since off Mirabegran due to cost, had urinary retention, s/p ED eval 11/22/20, f/u urology

## 2022-07-20 NOTE — Assessment & Plan Note (Signed)
Blood pressure is  controlled, on Torsemide.

## 2022-08-02 ENCOUNTER — Non-Acute Institutional Stay (SKILLED_NURSING_FACILITY): Payer: Medicare PPO | Admitting: Nurse Practitioner

## 2022-08-02 ENCOUNTER — Encounter: Payer: Self-pay | Admitting: Nurse Practitioner

## 2022-08-02 DIAGNOSIS — I2510 Atherosclerotic heart disease of native coronary artery without angina pectoris: Secondary | ICD-10-CM | POA: Diagnosis not present

## 2022-08-02 DIAGNOSIS — I10 Essential (primary) hypertension: Secondary | ICD-10-CM

## 2022-08-02 DIAGNOSIS — L509 Urticaria, unspecified: Secondary | ICD-10-CM | POA: Diagnosis not present

## 2022-08-02 DIAGNOSIS — G4700 Insomnia, unspecified: Secondary | ICD-10-CM

## 2022-08-02 DIAGNOSIS — I480 Paroxysmal atrial fibrillation: Secondary | ICD-10-CM | POA: Diagnosis not present

## 2022-08-02 DIAGNOSIS — R350 Frequency of micturition: Secondary | ICD-10-CM

## 2022-08-02 DIAGNOSIS — I6300 Cerebral infarction due to thrombosis of unspecified precerebral artery: Secondary | ICD-10-CM

## 2022-08-02 NOTE — Assessment & Plan Note (Signed)
heart rate is in control. EKG 12/05/19 Afib at cardiology, only takes ASA

## 2022-08-02 NOTE — Assessment & Plan Note (Signed)
Intermittent mildly elevated Bp, only taking Torsemide.

## 2022-08-02 NOTE — Assessment & Plan Note (Signed)
no difference since off Mirabegran due to cost, had urinary retention, s/p ED eval 11/22/20, f/u urology

## 2022-08-02 NOTE — Assessment & Plan Note (Signed)
takes Levothyroxine, TSH 2.08 09/30/21

## 2022-08-02 NOTE — Assessment & Plan Note (Addendum)
reported beefy redness in groins, thighs. Noted raised, red welts range from quarter sized to blotches inner thighs, swelling of the surface of the skin with clearly defined edges forearms/hands, thighs spreading to BLE. No new garment, bedding, cosmetic or cleaning products, or food noted. Will tray Prednisone 53m qd x 7 days, Claritin 120mqd x 10 days.

## 2022-08-02 NOTE — Assessment & Plan Note (Signed)
takes Zolpidem.  ?

## 2022-08-02 NOTE — Progress Notes (Signed)
Location:   SNF [facility] Room Number: 62 Place of Service:  SNF (31) Provider: Lennie Odor Mystique Bjelland NP  Clea Dubach X, NP  Patient Care Team: Rui Wordell X, NP as PCP - General (Internal Medicine) Belva Crome, MD (Inactive) as PCP - Cardiology (Cardiology)  Extended Emergency Contact Information Primary Emergency Contact: Gaster,Jean Address: [address]          [place], [place] Phone: [phone] Relation: Spouse Secondary Emergency Contact: Alan Ripper Address: [place], [place] [address] [place] Phone: [phone] Relation: Daughter  Code Status: DNR Goals of care: Advanced Directive information    07/20/2022   10:41 AM  Advanced Directives  Does Patient Have a Medical Advance Directive? Yes  Type of Advance Directive Living will;Out of facility DNR (pink MOST or yellow form)  Does patient want to make changes to medical advance directive? No - Patient declined  Pre-existing out of facility DNR order (yellow form or pink MOST form) Pink MOST form placed in chart (order not valid for inpatient use);Yellow form placed in chart (order not valid for inpatient use)     Chief Complaint  Patient presents with   Acute Visit    Rash in groins, thighs.     HPI:  Pt is a 89 y.o. male seen today for an acute visit for reported beefy redness in groins, thighs. Noted raised, red welts range from quarter sized to blotches inner thighs, swelling of the surface of the skin with clearly defined edges forearms/hands, thighs spreading to BLE   Afib, tachy brady, refused PPM, heart rate is in control. EKG 12/05/19 Afib at cardiology, only takes ASA             Thrombophilia, had work up in the past, venous US negative DVT in legs.               Urinary frequency, no difference since off Mirabegran due to cost, had urinary retention, s/p ED eval 11/22/20, f/u urology             CAD, on ASA, Fish oil, Coronary artery  bypass grafting 1992             Hx of GI bleed, Hgb 13.1 08/19/21             CVA, slightly left sided weakness with muscle strength 5/5             HTN, controlled, on Torsemide.  Pulmonary hypertension/edema BLE, takes Torsemide, [place] 6/18/21negative DVT, last echo 2018 EF 55-60% Insomnia, takes Zolpidem             Hypothyroidism, takes Levothyroxine, TSH 2.08 09/30/21             CKD Bun/creat 27/1.4 11/05/21             Hyperlipidemia, LDL 107 05/05/20, on Omega 3 supplement             Constipation, takes MiraLax, Colace.      Past Medical History:  Diagnosis Date   Atrial fibrillation (HCC)    CAD (coronary artery disease)    Diverticulosis    Hypercholesteremia    Hypertension    Hypothyroidism    Insomnia    Murmur    Paroxysmal atrial fibrillation (HCC)    Urinary dysfunction    Past Surgical History:  Procedure Laterality Date   BYPASS GRAFT  HEMORRHOID SURGERY     HERNIA REPAIR     HIP SURGERY      Allergies  Allergen Reactions   Cranberry Extract Cough   Shellfish Allergy Swelling and Rash    Allergies as of 08/02/2022       Reactions   Cranberry Extract Cough   Shellfish Allergy Swelling, Rash        Medication List        Accurate as of August 02, 2022  1:52 PM. If you have any questions, ask your nurse or doctor.          aspirin EC 81 MG tablet in the morning and at bedtime.   cholecalciferol 1000 units tablet Commonly known as: VITAMIN D Take 1,000 Units by mouth daily.   docusate sodium 100 MG capsule Commonly known as: COLACE Take 200 mg by mouth at bedtime.   levothyroxine 50 MCG tablet Commonly known as: SYNTHROID Take 50 mcg by mouth daily before breakfast.   nystatin powder Commonly known as: MYCOSTATIN/NYSTOP Apply 1 Application topically 2 (two) times daily. Apply to (L) groin area topically two times a day related to [place]   omega-3 acid ethyl esters 1 g capsule Commonly known  as: LOVAZA Take 1 g by mouth 2 (two) times daily.   polyethylene glycol 17 g packet Commonly known as: MIRALAX / GLYCOLAX Take 17 g by mouth daily as needed.   potassium chloride 10 MEQ CR capsule Commonly known as: MICRO-K Take 10 mEq by mouth daily.   PRESERVISION AREDS 2 PO Take by mouth daily.   [place]ms-M Tabs Take 1 tablet by mouth daily.   saccharomyces boulardii 250 MG capsule Commonly known as: FLORASTOR Take 250 mg by mouth daily.   Systane 0.4-0.3 % Soln Generic drug: Polyethyl Glycol-Propyl Glycol Place 2 drops into the left eye at bedtime.   torsemide 20 MG tablet Commonly known as: DEMADEX Take 20 mg by mouth daily.   vitamin C 1000 MG tablet Take 1,000 mg by mouth in the morning and at bedtime.   zolpidem 5 MG tablet Commonly known as: AMBIEN Take 1 tablet (5 mg total) by mouth at bedtime. TAKE ONE TABLET AT BEDTIME        Review of Systems  Constitutional:  Negative for activity change, appetite change and fever.  HENT:  Positive for hearing loss and trouble swallowing. Negative for congestion and voice change.   Respiratory:  Positive for shortness of breath. Negative for cough and wheezing.        Occasional nocturnal orthopnea at his baseline.   Cardiovascular:  Positive for leg swelling. Negative for chest pain and palpitations.  Gastrointestinal:  Negative for abdominal pain and constipation.  Genitourinary:  Positive for frequency. Negative for difficulty urinating and urgency.  Musculoskeletal:  Positive for arthralgias and gait problem.  Skin:  Positive for rash.  Neurological:  Negative for speech difficulty, weakness and headaches.  Psychiatric/Behavioral:  Negative for confusion and sleep disturbance. The patient is not nervous/anxious.     Immunization History  Administered Date(s) Administered   Influenza, High Dose Seasonal PF 04/11/2017, 03/26/2020   Influenza-Unspecified 03/26/2020, 04/01/2021, 04/12/2022   Moderna SARS-COV2  Booster Vaccination 04/06/2020   Moderna Sars-Covid-2 Vaccination 06/18/2019, 07/16/2019, 10/30/2021   PFIZER(Purple Top)SARS-COV-2 Vaccination 09/14/2020, 03/03/2021   Pfizer Covid-19 Vaccine Bivalent Booster 28yr & up 04/15/2022   Pneumococcal-Unspecified 08/12/2012   Tetanus 06/14/2004   Zoster Recombinat (Shingrix) 02/03/2017, 06/02/2017   Pertinent  Health Maintenance Due  Topic Date Due   INFLUENZA VACCINE  Completed      11/22/2020   10:54 AM 04/02/2022   10:16 AM 06/21/2022   12:27 PM 06/22/2022   11:47 AM 07/20/2022   10:38 AM  Fall Risk  Falls in the past year?   0 0 0  Was there an injury with Fall?  0 0 0 0  Fall Risk Category Calculator   0 0 0  Fall Risk Category (Retired)   Low Low   (RETIRED) Patient Fall Risk Level Low fall risk Low fall risk Low fall risk Low fall risk   Patient at Risk for Falls Due to  Impaired balance/gait;Impaired mobility;History of fall(s) History of fall(s) History of fall(s) History of fall(s)  Fall risk Follow up  Falls evaluation completed  Falls evaluation completed Falls evaluation completed   Functional Status Survey:    Vitals:   08/02/22 1240  BP: (!) 139/91  Pulse: 62  Resp: 18  Temp: (!) 97.3 F (36.3 C)  SpO2: 99%  Weight: 161 lb 4.8 oz (73.2 kg)   Body mass index is 27.69 kg/m. Physical Exam Vitals and nursing note reviewed.  Constitutional:      Appearance: Normal appearance.  HENT:     Head: Normocephalic and atraumatic.     Mouth/Throat:     Mouth: Mucous membranes are moist.  Eyes:     Extraocular Movements: Extraocular movements intact.     Conjunctiva/sclera: Conjunctivae normal.     Pupils: Pupils are equal, round, and reactive to light.     Comments: Left lower eyelid ectropion.   Cardiovascular:     Rate and Rhythm: Normal rate. Rhythm irregular.     Heart sounds: Murmur heard.  Pulmonary:     Effort: Pulmonary effort is normal.     Breath sounds: Rales present.     Comments: Bibasilar rales.   Abdominal:     General: Bowel sounds are normal.     Palpations: Abdomen is soft.     Tenderness: There is no abdominal tenderness.  Musculoskeletal:     Cervical back: Normal range of motion and neck supple.     Right lower leg: Edema present.     Left lower leg: Edema present.     Comments: 1-2 + edema BLE L>R, uses TED sometimes.  Skin:    General: Skin is warm and dry.     Findings: Erythema and rash present.     Comments:  reported beefy redness in groins, thighs. Noted raised, red welts range from quarter sized to blotches inner thighs, swelling of the surface of the skin with clearly defined edges forearms/hands, thighs spreading to BLE  Neurological:     General: No focal deficit present.     Mental Status: He is alert and oriented to person, place, and time. Mental status is at baseline.     Motor: Weakness present.     Coordination: Coordination normal.     Gait: Gait abnormal.     Comments: Left sided weakness form previous CVA, muscle strength 5/5  Psychiatric:        Mood and Affect: Mood normal.        Behavior: Behavior normal.        Thought Content: Thought content normal.        Judgment: Judgment normal.     Labs reviewed: Recent Labs    08/19/21 0134 11/05/21 0000  NA 136* 142  K 4.4 3.5  CL 99 103  CO2 27*  29*  BUN 25* 27*  CREATININE 1.4* 1.4*  CALCIUM 8.9 8.9   Recent Labs    08/19/21 0134  AST 16  ALT 12  ALKPHOS 71  ALBUMIN 3.4*   Recent Labs    08/19/21 0134  WBC 5.2  HGB 13.1*  HCT 38*  PLT 207   Lab Results  Component Value Date   TSH 2.08 09/30/2021   No results found for: "HGBA1C" Lab Results  Component Value Date   CHOL 183 05/05/2020   HDL 54 05/05/2020   LDLCALC 107 (H) 05/05/2020   TRIG 128 05/05/2020   CHOLHDL 3.4 05/05/2020    Significant Diagnostic Results in last 30 days:  No results found.  Assessment/Plan: Urticaria reported beefy redness in groins, thighs. Noted raised, red welts range from quarter  sized to blotches inner thighs, swelling of the surface of the skin with clearly defined edges forearms/hands, thighs spreading to BLE. No new garment, bedding, cosmetic or cleaning products, or food noted. Will tray Prednisone 65m qd x 7 days, Claritin 138mqd x 10 days.   Paroxysmal atrial fibrillation heart rate is in control. EKG 12/05/19 Afib at cardiology, only takes ASA  Urinary frequency no difference since off Mirabegran due to cost, had urinary retention, s/p ED eval 11/22/20, f/u urology  CAD (coronary artery disease) on ASA, Fish oil, Coronary artery bypass grafting 1992  [place] muscle strength 5/5  Hypertension Intermittent mildly elevated Bp, only taking Torsemide.   Insomnia  takes Zolpidem  Hypothyroidism  takes Levothyroxine, TSH 2.08 09/30/21    Family/ staff Communication: plan of care reviewed with the patient and charge nurse.   Labs/tests ordered:  none  Time spend 35 minutes.

## 2022-08-02 NOTE — Assessment & Plan Note (Signed)
on ASA, Fish oil, Coronary artery bypass grafting 1992

## 2022-08-02 NOTE — Assessment & Plan Note (Signed)
slightly left sided weakness with muscle strength 5/5

## 2022-08-24 ENCOUNTER — Non-Acute Institutional Stay (SKILLED_NURSING_FACILITY): Payer: Medicare PPO | Admitting: Family Medicine

## 2022-08-24 DIAGNOSIS — E039 Hypothyroidism, unspecified: Secondary | ICD-10-CM

## 2022-08-24 DIAGNOSIS — I350 Nonrheumatic aortic (valve) stenosis: Secondary | ICD-10-CM | POA: Diagnosis not present

## 2022-08-24 DIAGNOSIS — I10 Essential (primary) hypertension: Secondary | ICD-10-CM

## 2022-08-24 DIAGNOSIS — G4700 Insomnia, unspecified: Secondary | ICD-10-CM

## 2022-08-24 DIAGNOSIS — I6300 Cerebral infarction due to thrombosis of unspecified precerebral artery: Secondary | ICD-10-CM | POA: Diagnosis not present

## 2022-08-24 NOTE — Progress Notes (Signed)
Provider:  Alain Honey, MD Location:      Place of Service:     PCP: Mast, Man X, NP Patient Care Team: Mast, Man X, NP as PCP - General (Internal Medicine) Belva Crome, MD (Inactive) as PCP - Cardiology (Cardiology)  Extended Emergency Contact Information Primary Emergency Contact: Carson Myrtle Address: [address]          [place], [place] Phone: [phone] Relation: Spouse Secondary Emergency Contact: Alan Ripper Address: [place], [place] [address] [place] Phone: [phone] Relation: Daughter  Code Status:  Goals of Care: Advanced Directive information    07/20/2022   10:41 AM  Advanced Directives  Does Patient Have a Medical Advance Directive? Yes  Type of Advance Directive Living will;Out of facility DNR (pink MOST or yellow form)  Does patient want to make changes to medical advance directive? No - Patient declined  Pre-existing out of facility DNR order (yellow form or pink MOST form) Pink MOST form placed in chart (order not valid for inpatient use);Yellow form placed in chart (order not valid for inpatient use)      No chief complaint on file.   HPI: Patient is a 89 y.o. male seen today for medical management of chronic problems including paroxysmal atrial fibrillation for which rate is good and control and only anticoagulant is aspirin, coronary artery disease with CABG in 1992, old CVA, hypertension, omnia, hypothyroidism.  I found him in his room today which is where he stays 99% of the time.  He does come out to eat but in general tends to stay in his room.  He has a daughter who is very involved in his care and visits almost daily.  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  Memory remains very good and intact.  Most of the day is spent watching TV either news or sports. Denies problems with sleep appetite, bowel or bladder function.  Past Medical History:  Diagnosis Date    Atrial fibrillation (HCC)    CAD (coronary artery disease)    Diverticulosis    Hypercholesteremia    Hypertension    Hypothyroidism    Insomnia    Murmur    Paroxysmal atrial fibrillation (HCC)    Urinary dysfunction    Past Surgical History:  Procedure Laterality Date   BYPASS GRAFT     HEMORRHOID SURGERY     HERNIA REPAIR     HIP SURGERY      reports that he has quit smoking. He has never used smokeless tobacco. He reports current alcohol use. He reports that he does not use drugs. Social History   Socioeconomic History   Marital status: Married    Spouse name: Not on file   Number of children: Not on file   Years of education: Not on file   Highest education level: Not on file  Occupational History   Not on file  Tobacco Use   Smoking status: Former   Smokeless tobacco: Never  Substance and Sexual Activity   Alcohol use: Yes    Comment: occasional   Drug use: No   Sexual activity: Not Currently  Other Topics Concern   Not on file  Social History Narrative   Social History     Tobacco Use       Smoking status: None       Smokeless tobacco: Never Used  Alcohol use: No       Alcohol/week: 0.0 standard drinks     Drug use: No   Diet is good   No caffeine   Married since 1952   Lives in apartment, one story   Lives with full time home health   Pet- Cat (Tai)   Highest level of education: PHD   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   No difficulty affording medications   Social Determinants of Health   Financial Resource Strain: Not on file  Food Insecurity: Not on file  Transportation Needs: Not on file  Physical Activity: Not on file  Stress: Not on file  Social Connections: Not on file  Intimate Partner Violence: Not on file    Functional Status Survey:    Family  History  Problem Relation Age of Onset   Heart disease Mother    Cancer Father    ALS Brother    Heart disease Daughter     Health Maintenance  Topic Date Due   COVID-19 Vaccine (8 - 2023-24 season) 06/10/2022   Medicare Annual Wellness (AWV)  06/23/2023   Pneumonia Vaccine 71+ Years old  Completed   INFLUENZA VACCINE  Completed   Zoster Vaccines- Shingrix  Completed   HPV VACCINES  Aged Out   DTaP/Tdap/Td  Discontinued    Allergies  Allergen Reactions   Cranberry Extract Cough   Shellfish Allergy Swelling and Rash    Outpatient Encounter Medications as of 08/24/2022  Medication Sig   Ascorbic Acid (VITAMIN C) 1000 MG tablet Take 1,000 mg by mouth in the morning and at bedtime.   aspirin 81 MG EC tablet in the morning and at bedtime.    cholecalciferol (VITAMIN D) 1000 UNITS tablet Take 1,000 Units by mouth daily.   docusate sodium (COLACE) 100 MG capsule Take 200 mg by mouth at bedtime.   levothyroxine (SYNTHROID) 50 MCG tablet Take 50 mcg by mouth daily before breakfast.   Multiple Vitamins-Minerals (PRESERVISION AREDS 2 PO) Take by mouth daily.   Multiple Vitamins-Minerals (THEREMS-M) TABS Take 1 tablet by mouth daily.   nystatin (MYCOSTATIN/NYSTOP) powder Apply 1 Application topically 2 (two) times daily. Apply to (L) groin area topically two times a day related to [place]   omega-3 acid ethyl esters (LOVAZA) 1 g capsule Take 1 g by mouth 2 (two) times daily.   Polyethyl Glycol-Propyl Glycol (SYSTANE) 0.4-0.3 % SOLN Place 2 drops into the left eye at bedtime.   polyethylene glycol (MIRALAX / GLYCOLAX) 17 g packet Take 17 g by mouth daily as needed.   potassium chloride (MICRO-K) 10 MEQ CR capsule Take 10 mEq by mouth daily.   saccharomyces boulardii (FLORASTOR) 250 MG capsule Take 250 mg by mouth daily.   torsemide (DEMADEX) 20 MG tablet Take 20 mg by mouth daily.   zolpidem (AMBIEN) 5 MG tablet Take 1 tablet (5 mg total) by mouth at  bedtime. TAKE ONE TABLET AT BEDTIME   No facility-administered encounter medications on file as of 08/24/2022.    Review of Systems  Constitutional: Negative.   HENT: Negative.    Respiratory: Negative.    Cardiovascular: Negative.   Neurological:  Positive for speech difficulty and weakness.  Hematological:  Bruises/bleeds easily.  Psychiatric/Behavioral: Negative.    All other systems reviewed  and are negative.   There were no vitals filed for this visit. There is no height or weight on file to calculate BMI. Physical Exam Vitals and nursing note reviewed.  Constitutional:      Appearance: Normal appearance.  HENT:     Mouth/Throat:     Mouth: Mucous membranes are moist.     Pharynx: Oropharynx is clear.  Eyes:     Extraocular Movements: Extraocular movements intact.     Conjunctiva/sclera: Conjunctivae normal.     Comments: Some bilateral esotropia  Cardiovascular:     Rate and Rhythm: Normal rate. Rhythm irregular.  Pulmonary:     Effort: Pulmonary effort is normal.     Breath sounds: Normal breath sounds.  Abdominal:     General: Bowel sounds are normal.     Palpations: Abdomen is soft.  Musculoskeletal:     Comments: Uses either wheelchair or walker for ambulation  Skin:    General: Skin is warm and dry.     Findings: Bruising present.  Neurological:     General: No focal deficit present.     Mental Status: He is alert and oriented to person, place, and time.  Psychiatric:        Mood and Affect: Mood normal.        Behavior: Behavior normal.     Labs reviewed: Basic Metabolic Panel: Recent Labs    11/05/21 0000  NA 142  K 3.5  CL 103  CO2 29*  BUN 27*  CREATININE 1.4*  CALCIUM 8.9   Liver Function Tests: No results for input(s): "AST", "ALT", "ALKPHOS", "BILITOT", "PROT", "ALBUMIN" in the last 8760 hours. No results for input(s): "LIPASE", "AMYLASE" in the last 8760 hours. No results for input(s): "AMMONIA" in the last 8760 hours. CBC: No  results for input(s): "WBC", "NEUTROABS", "HGB", "HCT", "MCV", "PLT" in the last 8760 hours. Cardiac Enzymes: No results for input(s): "CKTOTAL", "CKMB", "CKMBINDEX", "TROPONINI" in the last 8760 hours. BNP: Invalid input(s): "POCBNP" No results found for: "HGBA1C" Lab Results  Component Value Date   TSH 2.08 09/30/2021   No results found for: "VITAMINB12" No results found for: "FOLATE" No results found for: "IRON", "TIBC", "FERRITIN"  Imaging and Procedures obtained prior to SNF admission: No results found.  Assessment/Plan 1. Nonrheumatic aortic valve stenosis Murmur still present no syncopal episodes, no chest pain  2. Cerebrovascular accident (CVA) due to thrombosis of precerebral artery ([place]) Summary residual left-sided weakness and some mild slurring of the speech  3. Primary hypertension Takes torsemide for blood pressure as well as edema on the left leg  4. Hypothyroidism, unspecified type Thyroid supplement 50 mcg/day.  Sh last assessed 10 months ago and was within normal range  5. Insomnia, unspecified type Used to take Ambien and this works well    Pharmacist, hospital Communication:   Labs/tests ordered: none  Lillette Boxer. Sabra Heck, [place] [address] [place], [place] Office [phone]

## 2022-08-26 ENCOUNTER — Other Ambulatory Visit: Payer: Self-pay | Admitting: Nurse Practitioner

## 2022-09-01 ENCOUNTER — Other Ambulatory Visit: Payer: Self-pay | Admitting: Adult Health

## 2022-09-01 DIAGNOSIS — G4700 Insomnia, unspecified: Secondary | ICD-10-CM

## 2022-09-01 MED ORDER — ZOLPIDEM TARTRATE 5 MG PO TABS: 5.0000 mg | ORAL_TABLET | Freq: Every day | ORAL | 0 refills | Status: DC

## 2022-09-02 ENCOUNTER — Encounter: Payer: Self-pay | Admitting: Nurse Practitioner

## 2022-09-02 DIAGNOSIS — W19XXXA Unspecified fall, initial encounter: Secondary | ICD-10-CM | POA: Insufficient documentation

## 2022-09-02 NOTE — Progress Notes (Signed)
This encounter was created in error - please disregard.

## 2022-09-07 ENCOUNTER — Non-Acute Institutional Stay (SKILLED_NURSING_FACILITY): Payer: Medicare PPO | Admitting: Family Medicine

## 2022-09-07 ENCOUNTER — Encounter: Payer: Self-pay | Admitting: Family Medicine

## 2022-09-07 ENCOUNTER — Encounter: Payer: Self-pay | Admitting: Nurse Practitioner

## 2022-09-07 DIAGNOSIS — I10 Essential (primary) hypertension: Secondary | ICD-10-CM

## 2022-09-07 DIAGNOSIS — I480 Paroxysmal atrial fibrillation: Secondary | ICD-10-CM

## 2022-09-07 DIAGNOSIS — W19XXXD Unspecified fall, subsequent encounter: Secondary | ICD-10-CM

## 2022-09-07 LAB — CBC AND DIFFERENTIAL
HCT: 41 (ref 41–53)
Hemoglobin: 13.8 (ref 13.5–17.5)
Neutrophils Absolute: 3360
Platelets: 189 10*3/uL (ref 150–400)
WBC: 6

## 2022-09-07 LAB — BASIC METABOLIC PANEL
BUN: 28 — AB (ref 4–21)
CO2: 32 — AB (ref 13–22)
Chloride: 102 (ref 99–108)
Creatinine: 1.5 — AB (ref 0.6–1.3)
Glucose: 107
Potassium: 4.2 mEq/L (ref 3.5–5.1)
Sodium: 140 (ref 137–147)

## 2022-09-07 LAB — COMPREHENSIVE METABOLIC PANEL
Albumin: 3.4 — AB (ref 3.5–5.0)
Calcium: 9.1 (ref 8.7–10.7)
Globulin: 3.1
eGFR: 41

## 2022-09-07 LAB — CBC: RBC: 4.24 (ref 3.87–5.11)

## 2022-09-07 LAB — TSH: TSH: 2.91 (ref 0.41–5.90)

## 2022-09-07 LAB — HEPATIC FUNCTION PANEL
ALT: 13 U/L (ref 10–40)
AST: 17 (ref 14–40)
Bilirubin, Total: 0.6

## 2022-09-07 NOTE — Progress Notes (Unsigned)
Location:  [facility] Room Number: 4 Place of Service:  SNF (31) Provider:  Nikiyah Fackler X, NP  Patient Care Team: Javyn Havlin X, NP as PCP - General (Internal Medicine) Belva Crome, MD (Inactive) as PCP - Cardiology (Cardiology)  Extended Emergency Contact Information Primary Emergency Contact: Klahn,Jean Address: [address]          [place], [place] of [place] Phone: [phone] Relation: Spouse Secondary Emergency Contact: Alan Ripper Address: [place], [place] [address] [place] Phone: [phone] Relation: Daughter  Code Status: DNR  Goals of care: Advanced Directive information    09/07/2022   10:38 AM  Advanced Directives  Does Patient Have a Medical Advance Directive? Yes  Type of Advance Directive Living will;Out of facility DNR (pink MOST or yellow form)  Does patient want to make changes to medical advance directive? No - Patient declined  Pre-existing out of facility DNR order (yellow form or pink MOST form) Pink MOST/Yellow Form most recent copy in chart - Physician notified to receive inpatient order     Chief Complaint  Patient presents with   Acute Visit    Falls    HPI:  Billy Barnes is a 89 y.o. male seen today for an acute visit for    Past Medical History:  Diagnosis Date   Atrial fibrillation ([facility])    CAD (coronary artery disease)    Diverticulosis    Hypercholesteremia    Hypertension    Hypothyroidism    Insomnia    Murmur    Paroxysmal atrial fibrillation ([facility])    Urinary dysfunction    Past Surgical History:  Procedure Laterality Date   BYPASS GRAFT     HEMORRHOID SURGERY     HERNIA REPAIR     HIP SURGERY      Allergies  Allergen Reactions   Cranberry Extract Cough   Shellfish Allergy Swelling and Rash    Outpatient Encounter Medications as of 09/07/2022  Medication Sig   Ascorbic Acid (VITAMIN C) 1000 MG tablet Take 1,000 mg by mouth in the  morning and at bedtime.   aspirin 81 MG EC tablet in the morning and at bedtime.    cholecalciferol (VITAMIN D) 1000 UNITS tablet Take 1,000 Units by mouth daily.   docusate sodium (COLACE) 100 MG capsule Take 200 mg by mouth at bedtime.   levothyroxine (SYNTHROID) 50 MCG tablet Take 50 mcg by mouth daily before breakfast.   Multiple Vitamins-Minerals (PRESERVISION AREDS 2 PO) Take by mouth daily.   Multiple Vitamins-Minerals (THEREMS-M) TABS Take 1 tablet by mouth daily.   nystatin (MYCOSTATIN/NYSTOP) powder Apply 1 Application topically 2 (two) times daily. Apply to (L) groin area topically two times a day related to [place]   omega-3 acid ethyl esters (LOVAZA) 1 g capsule Take 1 g by mouth 2 (two) times daily.   Polyethyl Glycol-Propyl Glycol (SYSTANE) 0.4-0.3 % SOLN Place 2 drops into the left eye at bedtime.   potassium chloride (MICRO-K) 10 MEQ CR capsule Take 10 mEq by mouth daily.   saccharomyces boulardii (FLORASTOR) 250 MG capsule Take 250 mg by mouth daily.   torsemide (DEMADEX) 20 MG tablet Take 20 mg by mouth daily.   zolpidem (AMBIEN) 5 MG tablet Take 1 tablet (5 mg total) by mouth at bedtime. TAKE ONE TABLET AT BEDTIME   No facility-administered encounter medications on file as  of 09/07/2022.    Review of Systems  Immunization History  Administered Date(s) Administered   Influenza, High Dose Seasonal PF 04/11/2017, 03/26/2020   Influenza-Unspecified 03/26/2020, 04/01/2021, 04/12/2022   Moderna SARS-COV2 Booster Vaccination 04/06/2020   Moderna Sars-Covid-2 Vaccination 06/18/2019, 07/16/2019, 10/30/2021   PFIZER(Purple Top)SARS-COV-2 Vaccination 09/14/2020, 03/03/2021   Pfizer Covid-19 Vaccine Bivalent Booster 56yrs & up 04/15/2022   Pneumococcal-Unspecified 08/12/2012   Tetanus 06/14/2004   Zoster Recombinat (Shingrix) 02/03/2017, 06/02/2017   Pertinent  Health Maintenance Due  Topic Date Due   INFLUENZA VACCINE  Completed       04/02/2022   10:16 AM 06/21/2022   12:27 PM 06/22/2022   11:47 AM 07/20/2022   10:38 AM 09/07/2022   10:32 AM  Fall Risk  Falls in the past year?  0 0 0 1  Was there an injury with Fall? 0 0 0 0 0  Fall Risk Category Calculator  0 0 0 2  Fall Risk Category (Retired)  Low Low    (RETIRED) Patient Fall Risk Level Low fall risk Low fall risk Low fall risk    Patient at Risk for Falls Due to Impaired balance/gait;Impaired mobility;History of fall(s) History of fall(s) History of fall(s) History of fall(s) History of fall(s);Impaired balance/gait;Impaired mobility  Fall risk Follow up Falls evaluation completed  Falls evaluation completed Falls evaluation completed Falls evaluation completed   Functional Status Survey:    Vitals:   09/07/22 1017  BP: 122/72  Pulse: 70  Resp: 17  Temp: 98 F (36.7 C)  SpO2: 96%  Weight: 163 lb 7 oz (74.1 kg)  Height: 5\' 4"  (1.626 m)   Body mass index is 28.05 kg/m. Physical Exam  Labs reviewed: Recent Labs    11/05/21 0000  NA 142  K 3.5  CL 103  CO2 29*  BUN 27*  CREATININE 1.4*  CALCIUM 8.9   No results for input(s): "AST", "ALT", "ALKPHOS", "BILITOT", "PROT", "ALBUMIN" in the last 8760 hours. No results for input(s): "WBC", "NEUTROABS", "HGB", "HCT", "MCV", "PLT" in the last 8760 hours. Lab Results  Component Value Date   TSH 2.08 09/30/2021   No results found for: "HGBA1C" Lab Results  Component Value Date   CHOL 183 05/05/2020   HDL 54 05/05/2020   LDLCALC 107 (H) 05/05/2020   TRIG 128 05/05/2020   CHOLHDL 3.4 05/05/2020    Significant Diagnostic Results in last 30 days:  No results found.  Assessment/Plan There are no diagnoses linked to this encounter.   Family/ staff Communication: ***  Labs/tests ordered:  *** This encounter was created in error - please disregard.

## 2022-09-07 NOTE — Progress Notes (Signed)
Location:  [facility] Room Number: [phone] Place of Service:  SNF [phone]) Provider:  Dr. Alain Honey.   Patient Care Team: Mast, Man X, NP as PCP - General (Internal Medicine) Belva Crome, MD (Inactive) as PCP - Cardiology (Cardiology)  Extended Emergency Contact Information Primary Emergency Contact: Carl,Jean Address: [address]          [place], [place] Phone: [phone] Relation: Spouse Secondary Emergency Contact: Alan Ripper Address: [place], [place] [address] [place] Phone: [phone] Relation: Daughter  Code Status:  DNR Goals of care: Advanced Directive information    09/07/2022    1:04 PM  Advanced Directives  Does Patient Have a Medical Advance Directive? Yes  Type of Advance Directive Living will;Out of facility DNR (pink MOST or yellow form)  Does patient want to make changes to medical advance directive? No - Patient declined     Chief Complaint  Patient presents with   Acute Visit    Fall    HPI:  Pt is a 89 y.o. male seen today for medical management of chronic diseases.  I was asked by his nurse to check this resident this morning.  He had a fall yesterday.  Some blood work was ordered which is pending at the time of this dictation.  Nurse also notes increased swelling in his face, specifically bags under his eyes.  It is normal for him to have this but back in a seems to be worse today. His daughter is here this morning who visits almost every day.  I asked her about his appearance and she says that this is not unusual for him given the fact that he is almost 89 years old. Patient denies any symptoms or problems such as increased edema, shortness of breath, or confusion.   Past Medical History:  Diagnosis Date   Atrial fibrillation (HCC)    CAD (coronary artery disease)    Diverticulosis    Hypercholesteremia    Hypertension    Hypothyroidism     Insomnia    Murmur    Paroxysmal atrial fibrillation (HCC)    Urinary dysfunction    Past Surgical History:  Procedure Laterality Date   BYPASS GRAFT     HEMORRHOID SURGERY     HERNIA REPAIR     HIP SURGERY      Allergies  Allergen Reactions   Cranberry Extract Cough   Shellfish Allergy Swelling and Rash    Outpatient Encounter Medications as of 09/07/2022  Medication Sig   Ascorbic Acid (VITAMIN C) 1000 MG tablet Take 1,000 mg by mouth in the morning and at bedtime.   aspirin 81 MG EC tablet in the morning and at bedtime.    cholecalciferol (VITAMIN D) 1000 UNITS tablet Take 1,000 Units by mouth daily.   docusate sodium (COLACE) 100 MG capsule Take 200 mg by mouth at bedtime.   levothyroxine (SYNTHROID) 50 MCG tablet Take 50 mcg by mouth daily before breakfast.   Multiple Vitamins-Minerals (PRESERVISION AREDS 2 PO) Take by mouth daily.   Multiple Vitamins-Minerals (THEREMS-M) TABS Take 1 tablet by mouth daily.   nystatin (MYCOSTATIN/NYSTOP) powder Apply 1 Application topically 2 (two) times daily. Apply to (L) groin area topically two times a day related to [place]   omega-3 acid ethyl esters (LOVAZA) 1 g capsule Take 1 g by mouth 2 (two) times  daily.   Polyethyl Glycol-Propyl Glycol (SYSTANE) 0.4-0.3 % SOLN Place 2 drops into the left eye at bedtime.   Polyethylene Glycol 3350 (PEG 3350) 17 GM/SCOOP POWD Give 17gm one time daily.   potassium chloride (MICRO-K) 10 MEQ CR capsule Take 10 mEq by mouth daily.   saccharomyces boulardii (FLORASTOR) 250 MG capsule Take 250 mg by mouth daily.   torsemide (DEMADEX) 20 MG tablet Take 20 mg by mouth daily.   zolpidem (AMBIEN) 5 MG tablet Take 1 tablet (5 mg total) by mouth at bedtime. TAKE ONE TABLET AT BEDTIME   No facility-administered encounter medications on file as of 09/07/2022.    Review of Systems  Constitutional: Negative.   HENT: Negative.    Respiratory: Negative.    Cardiovascular:  Negative.   Gastrointestinal: Negative.   Genitourinary: Negative.   Musculoskeletal: Negative.   Skin: Negative.   Neurological:  Positive for weakness.  Psychiatric/Behavioral: Negative.    All other systems reviewed and are negative.   Immunization History  Administered Date(s) Administered   Influenza, High Dose Seasonal PF 04/11/2017, 03/26/2020   Influenza-Unspecified 03/26/2020, 04/01/2021, 04/12/2022   Moderna SARS-COV2 Booster Vaccination 04/06/2020   Moderna Sars-Covid-2 Vaccination 06/18/2019, 07/16/2019, 10/30/2021   PFIZER(Purple Top)SARS-COV-2 Vaccination 09/14/2020, 03/03/2021   Pfizer Covid-19 Vaccine Bivalent Booster 72yrs & up 04/15/2022   Pneumococcal-Unspecified 08/12/2012   Tetanus 06/14/2004   Zoster Recombinat (Shingrix) 02/03/2017, 06/02/2017   Pertinent  Health Maintenance Due  Topic Date Due   INFLUENZA VACCINE  Completed      04/02/2022   10:16 AM 06/21/2022   12:27 PM 06/22/2022   11:47 AM 07/20/2022   10:38 AM 09/07/2022   10:32 AM  Fall Risk  Falls in the past year?  0 0 0 1  Was there an injury with Fall? 0 0 0 0 0  Fall Risk Category Calculator  0 0 0 2  Fall Risk Category (Retired)  Low Low    (RETIRED) Patient Fall Risk Level Low fall risk Low fall risk Low fall risk    Patient at Risk for Falls Due to Impaired balance/gait;Impaired mobility;History of fall(s) History of fall(s) History of fall(s) History of fall(s) History of fall(s);Impaired balance/gait;Impaired mobility  Fall risk Follow up Falls evaluation completed  Falls evaluation completed Falls evaluation completed Falls evaluation completed   Functional Status Survey:    Vitals:   09/07/22 1258  BP: 122/72  Pulse: 70  Resp: 17  Temp: 98 F (36.7 C)  SpO2: 96%  Weight: 163 lb 11.2 oz (74.3 kg)  Height: 5\' 4"  (1.626 m)   Body mass index is 28.1 kg/m. Physical Exam Vitals and nursing note reviewed.  Constitutional:      Appearance: Normal appearance.  HENT:     Head:  Normocephalic.  Cardiovascular:     Rate and Rhythm: Normal rate. Rhythm irregular.     Heart sounds: Murmur heard.  Pulmonary:     Effort: Pulmonary effort is normal.     Breath sounds: Normal breath sounds.  Abdominal:     General: Bowel sounds are normal.     Palpations: Abdomen is soft.  Skin:    Findings: Bruising present.  Neurological:     General: No focal deficit present.     Mental Status: He is alert and oriented to person, place, and time.  Psychiatric:        Mood and Affect: Mood normal.        Behavior: Behavior normal.     Labs reviewed: Recent  Labs    11/05/21 0000  NA 142  K 3.5  CL 103  CO2 29*  BUN 27*  CREATININE 1.4*  CALCIUM 8.9   No results for input(s): "AST", "ALT", "ALKPHOS", "BILITOT", "PROT", "ALBUMIN" in the last 8760 hours. No results for input(s): "WBC", "NEUTROABS", "HGB", "HCT", "MCV", "PLT" in the last 8760 hours. Lab Results  Component Value Date   TSH 2.08 09/30/2021   No results found for: "HGBA1C" Lab Results  Component Value Date   CHOL 183 05/05/2020   HDL 54 05/05/2020   LDLCALC 107 (H) 05/05/2020   TRIG 128 05/05/2020   CHOLHDL 3.4 05/05/2020    Significant Diagnostic Results in last 30 days:  No results found.  Assessment/Plan 1. Fall, subsequent encounter No ill effects from fall.  Appropriate to check some labs such as electrolytes and hemoglobin  2. Primary hypertension Blood pressure is good today 122/72 and pulse is 70  3. Paroxysmal atrial fibrillation (HCC) Irregular rhythm.  Murmur present.  Continue with torsemide and 81 mg aspirin    Family/ staff Communication:   Labs/tests ordered:    Lillette Boxer. Sabra Heck, [place] [address] [place], [place] Office [phone]

## 2022-09-08 NOTE — Progress Notes (Deleted)
This encounter was created in error - please disregard.

## 2022-09-08 NOTE — Progress Notes (Signed)
This encounter was created in error - please disregard.

## 2022-09-13 ENCOUNTER — Encounter: Payer: Self-pay | Admitting: Nurse Practitioner

## 2022-09-13 DIAGNOSIS — L8990 Pressure ulcer of unspecified site, unspecified stage: Secondary | ICD-10-CM | POA: Insufficient documentation

## 2022-09-29 ENCOUNTER — Non-Acute Institutional Stay (SKILLED_NURSING_FACILITY): Payer: Medicare PPO | Admitting: Nurse Practitioner

## 2022-09-29 ENCOUNTER — Encounter: Payer: Self-pay | Admitting: Nurse Practitioner

## 2022-09-29 DIAGNOSIS — N183 Chronic kidney disease, stage 3 unspecified: Secondary | ICD-10-CM

## 2022-09-29 DIAGNOSIS — I480 Paroxysmal atrial fibrillation: Secondary | ICD-10-CM

## 2022-09-29 DIAGNOSIS — I270 Primary pulmonary hypertension: Secondary | ICD-10-CM

## 2022-09-29 DIAGNOSIS — I10 Essential (primary) hypertension: Secondary | ICD-10-CM

## 2022-09-29 DIAGNOSIS — G4700 Insomnia, unspecified: Secondary | ICD-10-CM | POA: Diagnosis not present

## 2022-09-29 DIAGNOSIS — E039 Hypothyroidism, unspecified: Secondary | ICD-10-CM

## 2022-09-29 DIAGNOSIS — E785 Hyperlipidemia, unspecified: Secondary | ICD-10-CM

## 2022-09-29 DIAGNOSIS — R350 Frequency of micturition: Secondary | ICD-10-CM

## 2022-09-29 DIAGNOSIS — K5901 Slow transit constipation: Secondary | ICD-10-CM

## 2022-09-29 DIAGNOSIS — I2510 Atherosclerotic heart disease of native coronary artery without angina pectoris: Secondary | ICD-10-CM

## 2022-09-29 DIAGNOSIS — I6300 Cerebral infarction due to thrombosis of unspecified precerebral artery: Secondary | ICD-10-CM

## 2022-09-29 NOTE — Assessment & Plan Note (Signed)
slightly left sided weakness with muscle strength 5/5 

## 2022-09-29 NOTE — Assessment & Plan Note (Signed)
LDL 107 05/05/20, on Omega 3 supplement °

## 2022-09-29 NOTE — Assessment & Plan Note (Signed)
tachy brady, refused PPM, heart rate is in control. EKG 12/05/19 Afib at cardiology, only takes ASA 

## 2022-09-29 NOTE — Progress Notes (Addendum)
Location:   [facility] Room Number: 64A Place of Service:  SNF (31) Provider:  Margarit Minshall X, NP  Nishanth Mccaughan X, NP  Patient Care Team: Kadan Millstein X, NP as PCP - General (Internal Medicine) Lyn Records, MD (Inactive) as PCP - Cardiology (Cardiology)  Extended Emergency Contact Information Primary Emergency Contact: Gough,Jean Address: [address]          [place], [place] Home Phone: [phone] Relation: Spouse Secondary Emergency Contact: Lorretta Harp Address: [address]          [place], [place] [address] [place] Home Phone: [phone] Relation: Daughter  Code Status:  DNR Goals of care: Advanced Directive information    09/29/2022    8:33 AM  Advanced Directives  Does Patient Have a Medical Advance Directive? Yes  Type of Advance Directive Living will;Out of facility DNR (pink MOST or yellow form)  Does patient want to make changes to medical advance directive? No - Patient declined  Pre-existing out of facility DNR order (yellow form or pink MOST form) Pink MOST form placed in chart (order not valid for inpatient use);Yellow form placed in chart (order not valid for inpatient use)     Chief Complaint  Patient presents with   Medical Management of Chronic Issues    Routine follow up   Immunizations    COVID booster due    HPI:  Pt is a 89 y.o. male seen today for medical management of chronic diseases.                   Afib, tachy brady, refused PPM, heart rate is in control. EKG 12/05/19 Afib at cardiology, only takes ASA             Thrombophilia, had work up in the past, venous US negative DVT in legs.               Urinary frequency, no difference since off Mirabegran due to cost, had urinary retention, s/p ED eval 11/22/20, f/u urology             CAD, on ASA, Fish oil, Coronary artery bypass grafting 1992             Hx of GI bleed, Hgb 13.8 09/07/22             CVA, slightly left sided  weakness with muscle strength 5/5             HTN, controlled, on Torsemide.  Pulmonary hypertension/edema BLE, takes Torsemide, [place] 6/18/21negative DVT, last echo 2018 EF 55-60% Insomnia, takes Zolpidem             Hypothyroidism, takes Levothyroxine, TSH 2.08 09/30/21             CKD Bun/creat 28/1.5 09/07/22             Hyperlipidemia, LDL 107 05/05/20, on Omega 3 supplement             Constipation, takes Colace.    Past Medical History:  Diagnosis Date   Atrial fibrillation (HCC)    CAD (coronary artery disease)    Diverticulosis    Hypercholesteremia    Hypertension    Hypothyroidism    Insomnia    Murmur    Paroxysmal atrial fibrillation (HCC)    Urinary dysfunction    Past Surgical History:  Procedure Laterality Date   BYPASS GRAFT     HEMORRHOID SURGERY  HERNIA REPAIR     HIP SURGERY      Allergies  Allergen Reactions   Cranberry Extract Cough   Shellfish Allergy Swelling and Rash    Allergies as of 09/29/2022       Reactions   Cranberry Extract Cough   Shellfish Allergy Swelling, Rash        Medication List        Accurate as of September 29, 2022 11:59 PM. If you have any questions, ask your nurse or doctor.          aspirin EC 81 MG tablet in the morning and at bedtime.   cholecalciferol 1000 units tablet Commonly known as: VITAMIN D Take 1,000 Units by mouth daily.   docusate sodium 100 MG capsule Commonly known as: COLACE Take 200 mg by mouth at bedtime.   levothyroxine 50 MCG tablet Commonly known as: SYNTHROID Take 50 mcg by mouth daily before breakfast.   nystatin powder Commonly known as: MYCOSTATIN/NYSTOP Apply 1 Application topically 2 (two) times daily. Apply to (L) groin area topically two times a day related to RASH AND OTHER NONSPECIFIC SKIN ERUPTION   omega-3 acid ethyl esters 1 g capsule Commonly known as: LOVAZA Take 1 g by mouth 2 (two) times daily.   PEG 3350 17 GM/SCOOP Powd Give 17gm one time daily.    potassium chloride 10 MEQ CR capsule Commonly known as: MICRO-K Take 10 mEq by mouth daily.   PRESERVISION AREDS 2 PO Take by mouth daily.   Therems-M Tabs Take 1 tablet by mouth daily.   saccharomyces boulardii 250 MG capsule Commonly known as: FLORASTOR Take 250 mg by mouth daily.   Systane 0.4-0.3 % Soln Generic drug: Polyethyl Glycol-Propyl Glycol Place 2 drops into the left eye at bedtime.   torsemide 20 MG tablet Commonly known as: DEMADEX Take 20 mg by mouth daily.   vitamin C 1000 MG tablet Take 1,000 mg by mouth in the morning and at bedtime.   zolpidem 5 MG tablet Commonly known as: AMBIEN Take 1 tablet (5 mg total) by mouth at bedtime. TAKE ONE TABLET AT BEDTIME        Review of Systems  Constitutional:  Negative for activity change, appetite change and fever.  HENT:  Positive for hearing loss and trouble swallowing. Negative for congestion and voice change.   Respiratory:  Positive for shortness of breath. Negative for cough and wheezing.        Occasional nocturnal orthopnea at his baseline.   Cardiovascular:  Positive for leg swelling. Negative for chest pain and palpitations.  Gastrointestinal:  Negative for abdominal pain and constipation.  Genitourinary:  Positive for frequency. Negative for difficulty urinating and urgency.  Musculoskeletal:  Positive for arthralgias and gait problem.  Skin:  Negative for color change.  Neurological:  Negative for speech difficulty, weakness and headaches.  Psychiatric/Behavioral:  Negative for confusion and sleep disturbance. The patient is not nervous/anxious.     Immunization History  Administered Date(s) Administered   Influenza, High Dose Seasonal PF 04/11/2017, 03/26/2020   Influenza-Unspecified 03/26/2020, 04/01/2021, 04/12/2022   Moderna SARS-COV2 Booster Vaccination 04/06/2020   Moderna Sars-Covid-2 Vaccination 06/18/2019, 07/16/2019, 10/30/2021   PFIZER(Purple Top)SARS-COV-2 Vaccination 09/14/2020,  03/03/2021   Pfizer Covid-19 Vaccine Bivalent Booster 57yrs & up 04/15/2022   Pneumococcal-Unspecified 08/12/2012   Tetanus 06/14/2004   Zoster Recombinat (Shingrix) 02/03/2017, 06/02/2017   Pertinent  Health Maintenance Due  Topic Date Due   INFLUENZA VACCINE  01/13/2023      04/02/2022  10:16 AM 06/21/2022   12:27 PM 06/22/2022   11:47 AM 07/20/2022   10:38 AM 09/07/2022   10:32 AM  Fall Risk  Falls in the past year?  0 0 0 1  Was there an injury with Fall? 0 0 0 0 0  Fall Risk Category Calculator  0 0 0 2  Fall Risk Category (Retired)  Low Low    (RETIRED) Patient Fall Risk Level Low fall risk Low fall risk Low fall risk    Patient at Risk for Falls Due to Impaired balance/gait;Impaired mobility;History of fall(s) History of fall(s) History of fall(s) History of fall(s) History of fall(s);Impaired balance/gait;Impaired mobility  Fall risk Follow up Falls evaluation completed  Falls evaluation completed Falls evaluation completed Falls evaluation completed   Functional Status Survey:    Vitals:   09/29/22 0829  BP: 100/62  Pulse: 61  Resp: 18  Temp: 98.7 F (37.1 C)  SpO2: 93%  Weight: 164 lb 11.2 oz (74.7 kg)  Height: 5\' 4"  (1.626 m)   Body mass index is 28.27 kg/m. Physical Exam Vitals and nursing note reviewed.  Constitutional:      Appearance: Normal appearance.  HENT:     Head: Normocephalic and atraumatic.     Mouth/Throat:     Mouth: Mucous membranes are moist.  Eyes:     Extraocular Movements: Extraocular movements intact.     Conjunctiva/sclera: Conjunctivae normal.     Pupils: Pupils are equal, round, and reactive to light.     Comments: Left lower eyelid ectropion.   Cardiovascular:     Rate and Rhythm: Normal rate. Rhythm irregular.     Heart sounds: Murmur heard.  Pulmonary:     Effort: Pulmonary effort is normal.     Breath sounds: No rales.  Abdominal:     General: Bowel sounds are normal.     Palpations: Abdomen is soft.     Tenderness:  There is no abdominal tenderness.  Musculoskeletal:     Cervical back: Normal range of motion and neck supple.     Right lower leg: Edema present.     Left lower leg: Edema present.     Comments: 1-2 + edema BLE L>R, uses TED sometimes.  Skin:    General: Skin is warm and dry.     Findings: No erythema or rash.  Neurological:     General: No focal deficit present.     Mental Status: He is alert and oriented to person, place, and time. Mental status is at baseline.     Motor: Weakness present.     Coordination: Coordination normal.     Gait: Gait abnormal.     Comments: Left sided weakness form previous CVA, muscle strength 5/5  Psychiatric:        Mood and Affect: Mood normal.        Behavior: Behavior normal.        Thought Content: Thought content normal.        Judgment: Judgment normal.     Labs reviewed: Recent Labs    11/05/21 0000 09/07/22 0000  NA 142 140  K 3.5 4.2  CL 103 102  CO2 29* 32*  BUN 27* 28*  CREATININE 1.4* 1.5*  CALCIUM 8.9 9.1   Recent Labs    09/07/22 0000  AST 17  ALT 13  ALBUMIN 3.4*   Recent Labs    09/07/22 0000  WBC 6.0  NEUTROABS 3,360.00  HGB 13.8  HCT 41  PLT 189  Lab Results  Component Value Date   TSH 2.91 09/07/2022   No results found for: "HGBA1C" Lab Results  Component Value Date   CHOL 183 05/05/2020   HDL 54 05/05/2020   LDLCALC 107 (H) 05/05/2020   TRIG 128 05/05/2020   CHOLHDL 3.4 05/05/2020    Significant Diagnostic Results in last 30 days:  No results found.  Assessment/Plan Hypertension Blood pressure is  controlled, on Torsemide.   Pulmonary hypertension, primary (HCC) Pulmonary hypertension/edema BLE, takes Torsemide, [place] 6/18/21negative DVT, last echo 2018 EF 55-60%  Insomnia Sleeps well, takes Zolpidem  Hypothyroidism  takes Levothyroxine, TSH 2.08 09/30/21  CKD (chronic kidney disease) stage 3, GFR 30-59 ml/min (HCC) Stable,  Bun/creat 28/1.5 09/07/22  Hyperlipidemia  LDL 107  05/05/20, on Omega 3 supplement  Slow transit constipation Occasional loose stools while on MiraLax, now taking Colace   CVA (cerebral vascular accident) (HCC)  slightly left sided weakness with muscle strength 5/5  CAD (coronary artery disease)  on ASA, Fish oil, Coronary artery bypass grafting 1992  Urinary frequency Urinary frequency, no difference since off Mirabegran due to cost, had urinary retention, s/p ED eval 11/22/20, f/u urology  Paroxysmal atrial fibrillation tachy brady, refused PPM, heart rate is in control. EKG 12/05/19 Afib at cardiology, only takes ASA     Family/ staff Communication: plan of care reviewed with the patient, the patient's daughter, and charge nurse.   Labs/tests ordered:  none  Time spend 35 minutes.

## 2022-09-29 NOTE — Assessment & Plan Note (Signed)
on ASA, Fish oil, Coronary artery bypass grafting 1992 

## 2022-09-29 NOTE — Assessment & Plan Note (Signed)
takes Levothyroxine, TSH 2.08 09/30/21 

## 2022-09-29 NOTE — Assessment & Plan Note (Signed)
Urinary frequency, no difference since off Mirabegran due to cost, had urinary retention, s/p ED eval 11/22/20, f/u urology ?

## 2022-09-29 NOTE — Assessment & Plan Note (Signed)
Blood pressure is  controlled, on Torsemide. 

## 2022-09-29 NOTE — Assessment & Plan Note (Signed)
Pulmonary hypertension/edema BLE, takes Torsemide, US 6/18/21negative DVT, last echo 2018 EF 55-60% 

## 2022-09-29 NOTE — Assessment & Plan Note (Signed)
Sleeps well, takes Zolpidem

## 2022-09-29 NOTE — Assessment & Plan Note (Signed)
Occasional loose stools while on MiraLax, now taking Colace

## 2022-09-29 NOTE — Assessment & Plan Note (Signed)
Stable,  Bun/creat 28/1.5 09/07/22

## 2022-10-18 ENCOUNTER — Non-Acute Institutional Stay (SKILLED_NURSING_FACILITY): Payer: Medicare PPO | Admitting: Nurse Practitioner

## 2022-10-18 ENCOUNTER — Encounter: Payer: Self-pay | Admitting: Nurse Practitioner

## 2022-10-18 DIAGNOSIS — E039 Hypothyroidism, unspecified: Secondary | ICD-10-CM

## 2022-10-18 DIAGNOSIS — I480 Paroxysmal atrial fibrillation: Secondary | ICD-10-CM | POA: Diagnosis not present

## 2022-10-18 DIAGNOSIS — K5901 Slow transit constipation: Secondary | ICD-10-CM

## 2022-10-18 DIAGNOSIS — H01002 Unspecified blepharitis right lower eyelid: Secondary | ICD-10-CM

## 2022-10-18 DIAGNOSIS — I2510 Atherosclerotic heart disease of native coronary artery without angina pectoris: Secondary | ICD-10-CM

## 2022-10-18 DIAGNOSIS — H01005 Unspecified blepharitis left lower eyelid: Secondary | ICD-10-CM

## 2022-10-18 DIAGNOSIS — N183 Chronic kidney disease, stage 3 unspecified: Secondary | ICD-10-CM

## 2022-10-18 DIAGNOSIS — Z8719 Personal history of other diseases of the digestive system: Secondary | ICD-10-CM

## 2022-10-18 DIAGNOSIS — I10 Essential (primary) hypertension: Secondary | ICD-10-CM

## 2022-10-18 DIAGNOSIS — D6859 Other primary thrombophilia: Secondary | ICD-10-CM

## 2022-10-18 DIAGNOSIS — R350 Frequency of micturition: Secondary | ICD-10-CM | POA: Diagnosis not present

## 2022-10-18 DIAGNOSIS — I6300 Cerebral infarction due to thrombosis of unspecified precerebral artery: Secondary | ICD-10-CM

## 2022-10-18 DIAGNOSIS — H01003 Unspecified blepharitis right eye, unspecified eyelid: Secondary | ICD-10-CM | POA: Insufficient documentation

## 2022-10-18 DIAGNOSIS — E785 Hyperlipidemia, unspecified: Secondary | ICD-10-CM

## 2022-10-18 DIAGNOSIS — G4700 Insomnia, unspecified: Secondary | ICD-10-CM

## 2022-10-18 NOTE — Assessment & Plan Note (Signed)
on ASA, Fish oil, Coronary artery bypass grafting 1992 

## 2022-10-18 NOTE — Assessment & Plan Note (Signed)
no difference since off Mirabegran due to cost, had urinary retention, s/p ED eval 11/22/20, f/u urology 

## 2022-10-18 NOTE — Assessment & Plan Note (Signed)
tachy brady, refused PPM, heart rate is in control. EKG 12/05/19 Afib at cardiology, only takes ASA 

## 2022-10-18 NOTE — Assessment & Plan Note (Signed)
Stable, takes Colace.  

## 2022-10-18 NOTE — Assessment & Plan Note (Signed)
Blood pressure is controlled, controlled, on Torsemide.  

## 2022-10-18 NOTE — Assessment & Plan Note (Signed)
Hgb 13.8 09/07/22

## 2022-10-18 NOTE — Assessment & Plan Note (Signed)
takes Zolpidem.  ?

## 2022-10-18 NOTE — Progress Notes (Signed)
Location:  [facility] Room Number: [id number] Place of Service:  SNF (31) Provider:  Runell Kovich X, NP  Patient Care Team: Dannisha Eckmann X, NP as PCP - General (Internal Medicine) Lyn Records, MD (Inactive) as PCP - Cardiology (Cardiology)  Extended Emergency Contact Information Primary Emergency Contact: Trombly,Jean Address: [address]          [place], [place] Home Phone: [phone] Relation: Spouse Secondary Emergency Contact: Lorretta Harp Address: [address]          [place], [place] [address] [place] Home Phone: [phone] Relation: Daughter  Code Status:  DNR Goals of care: Advanced Directive information    10/18/2022   10:21 AM  Advanced Directives  Does Patient Have a Medical Advance Directive? Yes  Type of Advance Directive Living will;Out of facility DNR (pink MOST or yellow form)  Does patient want to make changes to medical advance directive? No - Patient declined  Pre-existing out of facility DNR order (yellow form or pink MOST form) Pink MOST form placed in chart (order not valid for inpatient use);Yellow form placed in chart (order not valid for inpatient use)     Chief Complaint  Patient presents with   Medical Management of Chronic Issues    Patient is here for a follow up for chronic conditions    Quality Metric Gaps    Patient is due for updated covid booster    HPI:  Pt is a 89 y.o. male seen today for medical management of chronic diseases.     Chronic blepharitis/lower eyelids ectropion, mild crusted eyelashes, patient declined ophthalmic ointment.    Afib, tachy brady, refused PPM, heart rate is in control. EKG 12/05/19 Afib at cardiology, only takes ASA             Thrombophilia, had work up in the past, venous US negative DVT in legs.               Urinary frequency, no difference since off Mirabegran due to cost, had urinary retention, s/p ED eval 11/22/20, f/u urology              CAD, on ASA, Fish oil, Coronary artery bypass grafting 1992             Hx of GI bleed, Hgb 13.8 09/07/22             CVA, slightly left sided weakness with muscle strength 5/5             HTN, controlled, on Torsemide.  Pulmonary hypertension/edema BLE, takes Torsemide, [place] 6/18/21negative DVT, last echo 2018 EF 55-60% Insomnia, takes Zolpidem             Hypothyroidism, takes Levothyroxine, TSH 2.08 09/30/21             CKD Bun/creat 28/1.5 09/07/22             Hyperlipidemia, LDL 107 05/05/20, on Omega 3 supplement             Constipation, takes Colace.       Past Medical History:  Diagnosis Date   Atrial fibrillation (HCC)    CAD (coronary artery disease)    Diverticulosis    Hypercholesteremia    Hypertension    Hypothyroidism    Insomnia    Murmur    Paroxysmal atrial fibrillation (HCC)    Urinary dysfunction    Past Surgical History:  Procedure Laterality Date  BYPASS GRAFT     HEMORRHOID SURGERY     HERNIA REPAIR     HIP SURGERY      Allergies  Allergen Reactions   Cranberry Extract Cough   Shellfish Allergy Swelling and Rash    Outpatient Encounter Medications as of 10/18/2022  Medication Sig   Ascorbic Acid (VITAMIN C) 1000 MG tablet Take 1,000 mg by mouth in the morning and at bedtime.   aspirin 81 MG EC tablet in the morning and at bedtime.    cholecalciferol (VITAMIN D) 1000 UNITS tablet Take 1,000 Units by mouth daily.   docusate sodium (COLACE) 100 MG capsule Take 200 mg by mouth at bedtime.   levothyroxine (SYNTHROID) 50 MCG tablet Take 50 mcg by mouth daily before breakfast.   Multiple Vitamins-Minerals (PRESERVISION AREDS 2 PO) Take by mouth daily.   Multiple Vitamins-Minerals (THEREMS-M) TABS Take 1 tablet by mouth daily.   nystatin (MYCOSTATIN/NYSTOP) powder Apply 1 Application topically 2 (two) times daily. Apply to (L) groin area topically two times a day related to RASH AND OTHER NONSPECIFIC SKIN ERUPTION   omega-3 acid ethyl esters (LOVAZA) 1 g  capsule Take 1 g by mouth 2 (two) times daily.   Polyethyl Glycol-Propyl Glycol (SYSTANE) 0.4-0.3 % SOLN Place 2 drops into the left eye at bedtime.   Polyethylene Glycol 3350 (PEG 3350) 17 GM/SCOOP POWD Give 17gm one time daily.   potassium chloride (MICRO-K) 10 MEQ CR capsule Take 10 mEq by mouth daily.   saccharomyces boulardii (FLORASTOR) 250 MG capsule Take 250 mg by mouth daily.   torsemide (DEMADEX) 20 MG tablet Take 20 mg by mouth daily.   zolpidem (AMBIEN) 5 MG tablet Take 1 tablet (5 mg total) by mouth at bedtime. TAKE ONE TABLET AT BEDTIME   No facility-administered encounter medications on file as of 10/18/2022.    Review of Systems  Constitutional:  Negative for activity change, appetite change and fever.  HENT:  Positive for hearing loss and trouble swallowing. Negative for congestion and voice change.   Respiratory:  Positive for shortness of breath. Negative for cough and wheezing.        Occasional nocturnal orthopnea at his baseline.   Cardiovascular:  Positive for leg swelling. Negative for chest pain and palpitations.  Gastrointestinal:  Negative for abdominal pain and constipation.  Genitourinary:  Positive for frequency. Negative for difficulty urinating and urgency.  Musculoskeletal:  Positive for arthralgias and gait problem.  Skin:  Negative for color change.  Neurological:  Negative for speech difficulty, weakness and headaches.  Psychiatric/Behavioral:  Negative for confusion and sleep disturbance. The patient is not nervous/anxious.     Immunization History  Administered Date(s) Administered   Influenza, High Dose Seasonal PF 04/11/2017, 03/26/2020   Influenza-Unspecified 03/26/2020, 04/01/2021, 04/12/2022   Moderna SARS-COV2 Booster Vaccination 04/06/2020   Moderna Sars-Covid-2 Vaccination 06/18/2019, 07/16/2019, 10/30/2021   PFIZER(Purple Top)SARS-COV-2 Vaccination 09/14/2020, 03/03/2021   Pfizer Covid-19 Vaccine Bivalent Booster 24yrs & up 04/15/2022    Pneumococcal-Unspecified 08/12/2012   Tetanus 06/14/2004   Zoster Recombinat (Shingrix) 02/03/2017, 06/02/2017   Pertinent  Health Maintenance Due  Topic Date Due   INFLUENZA VACCINE  01/13/2023      06/21/2022   12:27 PM 06/22/2022   11:47 AM 07/20/2022   10:38 AM 09/07/2022   10:32 AM 10/18/2022   10:21 AM  Fall Risk  Falls in the past year? 0 0 0 1 1  Was there an injury with Fall? 0 0 0 0 0  Fall Risk Category  Calculator 0 0 0 2 2  Fall Risk Category (Retired) Low Low     (RETIRED) Patient Fall Risk Level Low fall risk Low fall risk     Patient at Risk for Falls Due to History of fall(s) History of fall(s) History of fall(s) History of fall(s);Impaired balance/gait;Impaired mobility History of fall(s);Impaired balance/gait  Fall risk Follow up  Falls evaluation completed Falls evaluation completed Falls evaluation completed Falls evaluation completed   Functional Status Survey:    Vitals:   10/18/22 1019  BP: 112/80  Pulse: 63  Resp: 16  Temp: (!) 97.1 F (36.2 C)  SpO2: 94%  Weight: 166 lb 9.6 oz (75.6 kg)  Height: 5\' 4"  (1.626 m)   Body mass index is 28.6 kg/m. Physical Exam Vitals and nursing note reviewed.  Constitutional:      Appearance: Normal appearance.  HENT:     Head: Normocephalic and atraumatic.     Mouth/Throat:     Mouth: Mucous membranes are moist.  Eyes:     Extraocular Movements: Extraocular movements intact.     Conjunctiva/sclera: Conjunctivae normal.     Pupils: Pupils are equal, round, and reactive to light.     Comments: R+L lower eyelid ectropion, mild crusted eyelashes.   Cardiovascular:     Rate and Rhythm: Normal rate. Rhythm irregular.     Heart sounds: Murmur heard.  Pulmonary:     Effort: Pulmonary effort is normal.     Breath sounds: No rales.  Abdominal:     General: Bowel sounds are normal.     Palpations: Abdomen is soft.     Tenderness: There is no abdominal tenderness.  Musculoskeletal:     Cervical back: Normal range of  motion and neck supple.     Right lower leg: Edema present.     Left lower leg: Edema present.     Comments: 1-2 + edema BLE L>R, uses TED sometimes.  Skin:    General: Skin is warm and dry.     Findings: No erythema or rash.  Neurological:     General: No focal deficit present.     Mental Status: He is alert and oriented to person, place, and time. Mental status is at baseline.     Motor: Weakness present.     Coordination: Coordination normal.     Gait: Gait abnormal.     Comments: Left sided weakness form previous CVA, muscle strength 5/5  Psychiatric:        Mood and Affect: Mood normal.        Behavior: Behavior normal.        Thought Content: Thought content normal.        Judgment: Judgment normal.     Labs reviewed: Recent Labs    11/05/21 0000 09/07/22 0000  NA 142 140  K 3.5 4.2  CL 103 102  CO2 29* 32*  BUN 27* 28*  CREATININE 1.4* 1.5*  CALCIUM 8.9 9.1   Recent Labs    09/07/22 0000  AST 17  ALT 13  ALBUMIN 3.4*   Recent Labs    09/07/22 0000  WBC 6.0  NEUTROABS 3,360.00  HGB 13.8  HCT 41  PLT 189   Lab Results  Component Value Date   TSH 2.91 09/07/2022   No results found for: "HGBA1C" Lab Results  Component Value Date   CHOL 183 05/05/2020   HDL 54 05/05/2020   LDLCALC 107 (H) 05/05/2020   TRIG 128 05/05/2020   CHOLHDL 3.4 05/05/2020  Significant Diagnostic Results in last 30 days:  No results found.  Assessment/Plan Blepharitis of both eyes Chronic blepharitis/lower eyelids ectropion, mild crusted eyelashes, patient declined ophthalmic ointment.    Paroxysmal atrial fibrillation tachy brady, refused PPM, heart rate is in control. EKG 12/05/19 Afib at cardiology, only takes ASA  Thrombophilia (HCC) had work up in the past, venous US negative DVT in legs.   Urinary frequency no difference since off Mirabegran due to cost, had urinary retention, s/p ED eval 11/22/20, f/u urology  CAD (coronary artery disease) on ASA, Fish  oil, Coronary artery bypass grafting 1992  History of GI bleed Hgb 13.8 09/07/22  CVA (cerebral vascular accident) (HCC)  slightly left sided weakness with muscle strength 5/5  Hypertension Blood pressure is controlled,  controlled, on Torsemide.   Insomnia  takes Zolpidem  Hypothyroidism takes Levothyroxine, TSH 2.08 09/30/21  CKD (chronic kidney disease) stage 3, GFR 30-59 ml/min (HCC) Bun/creat 28/1.5 09/07/22  Hyperlipidemia LDL 107 05/05/20, on Omega 3 supplement     Family/ staff Communication: plan of care reviewed with the patient and charge nurse.   Labs/tests ordered:  none  Time spend 25 minutes.

## 2022-10-18 NOTE — Assessment & Plan Note (Signed)
had work up in the past, venous US negative DVT in legs.  

## 2022-10-18 NOTE — Assessment & Plan Note (Signed)
slightly left sided weakness with muscle strength 5/5 

## 2022-10-18 NOTE — Assessment & Plan Note (Signed)
LDL 107 05/05/20, on Omega 3 supplement °

## 2022-10-18 NOTE — Assessment & Plan Note (Signed)
Bun/creat 28/1.5 09/07/22

## 2022-10-18 NOTE — Assessment & Plan Note (Signed)
Chronic blepharitis/lower eyelids ectropion, mild crusted eyelashes, patient declined ophthalmic ointment.

## 2022-10-18 NOTE — Assessment & Plan Note (Signed)
takes Levothyroxine, TSH 2.08 09/30/21 

## 2022-10-22 ENCOUNTER — Encounter: Payer: Self-pay | Admitting: Nurse Practitioner

## 2022-10-22 NOTE — Progress Notes (Unsigned)
Location:  [facility] Room Number: NO/[id number] Place of Service:  SNF (31) Provider:  Earlie Arciga X, NP  Patient Care Team: Ellicia Alix X, NP as PCP - General (Internal Medicine) Lyn Records, MD (Inactive) as PCP - Cardiology (Cardiology)  Extended Emergency Contact Information Primary Emergency Contact: Steil,Jean Address: [address]          [place], [place] Home Phone: [phone] Relation: Spouse Secondary Emergency Contact: Lorretta Harp Address: [address]          [place], [place] [address] [place] Home Phone: [phone] Relation: Daughter  Code Status:  DNR Goals of care: Advanced Directive information    10/18/2022   10:21 AM  Advanced Directives  Does Patient Have a Medical Advance Directive? Yes  Type of Advance Directive Living will;Out of facility DNR (pink MOST or yellow form)  Does patient want to make changes to medical advance directive? No - Patient declined  Pre-existing out of facility DNR order (yellow form or pink MOST form) Pink MOST form placed in chart (order not valid for inpatient use);Yellow form placed in chart (order not valid for inpatient use)     Chief Complaint  Patient presents with   Acute Visit   Cough    Patient is being seen for cough    HPI:  Pt is a 89 y.o. male seen today for an acute visit for    Past Medical History:  Diagnosis Date   Atrial fibrillation (HCC)    CAD (coronary artery disease)    Diverticulosis    Hypercholesteremia    Hypertension    Hypothyroidism    Insomnia    Murmur    Paroxysmal atrial fibrillation (HCC)    Urinary dysfunction    Past Surgical History:  Procedure Laterality Date   BYPASS GRAFT     HEMORRHOID SURGERY     HERNIA REPAIR     HIP SURGERY      Allergies  Allergen Reactions   Cranberry Extract Cough   Shellfish Allergy Swelling and Rash    Outpatient Encounter Medications as of 10/22/2022  Medication Sig    Ascorbic Acid (VITAMIN C) 1000 MG tablet Take 1,000 mg by mouth in the morning and at bedtime.   aspirin 81 MG EC tablet in the morning and at bedtime.    cholecalciferol (VITAMIN D) 1000 UNITS tablet Take 1,000 Units by mouth daily.   docusate sodium (COLACE) 100 MG capsule Take 200 mg by mouth at bedtime.   levothyroxine (SYNTHROID) 50 MCG tablet Take 50 mcg by mouth daily before breakfast.   Multiple Vitamins-Minerals (PRESERVISION AREDS 2 PO) Take by mouth daily.   Multiple Vitamins-Minerals (THEREMS-M) TABS Take 1 tablet by mouth daily.   nystatin (MYCOSTATIN/NYSTOP) powder Apply 1 Application topically 2 (two) times daily. Apply to (L) groin area topically two times a day related to RASH AND OTHER NONSPECIFIC SKIN ERUPTION   omega-3 acid ethyl esters (LOVAZA) 1 g capsule Take 1 g by mouth 2 (two) times daily.   Polyethyl Glycol-Propyl Glycol (SYSTANE) 0.4-0.3 % SOLN Place 2 drops into the left eye at bedtime.   Polyethylene Glycol 3350 (PEG 3350) 17 GM/SCOOP POWD Give 17gm one time daily.   potassium chloride (MICRO-K) 10 MEQ CR capsule Take 10 mEq by mouth daily.   saccharomyces boulardii (FLORASTOR) 250 MG capsule Take 250 mg by mouth daily.   torsemide (DEMADEX) 20 MG tablet Take 20 mg by mouth daily.  zolpidem (AMBIEN) 5 MG tablet Take 1 tablet (5 mg total) by mouth at bedtime. TAKE ONE TABLET AT BEDTIME   No facility-administered encounter medications on file as of 10/22/2022.    Review of Systems  Immunization History  Administered Date(s) Administered   Influenza, High Dose Seasonal PF 04/11/2017, 03/26/2020   Influenza-Unspecified 03/26/2020, 04/01/2021, 04/12/2022   Moderna SARS-COV2 Booster Vaccination 04/06/2020   Moderna Sars-Covid-2 Vaccination 06/18/2019, 07/16/2019, 10/30/2021   PFIZER(Purple Top)SARS-COV-2 Vaccination 09/14/2020, 03/03/2021   Pfizer Covid-19 Vaccine Bivalent Booster 63yrs & up 04/15/2022   Pneumococcal-Unspecified 08/12/2012   Tetanus 06/14/2004    Zoster Recombinat (Shingrix) 02/03/2017, 06/02/2017   Pertinent  Health Maintenance Due  Topic Date Due   INFLUENZA VACCINE  01/13/2023      06/21/2022   12:27 PM 06/22/2022   11:47 AM 07/20/2022   10:38 AM 09/07/2022   10:32 AM 10/18/2022   10:21 AM  Fall Risk  Falls in the past year? 0 0 0 1 1  Was there an injury with Fall? 0 0 0 0 0  Fall Risk Category Calculator 0 0 0 2 2  Fall Risk Category (Retired) Low Low     (RETIRED) Patient Fall Risk Level Low fall risk Low fall risk     Patient at Risk for Falls Due to History of fall(s) History of fall(s) History of fall(s) History of fall(s);Impaired balance/gait;Impaired mobility History of fall(s);Impaired balance/gait  Fall risk Follow up  Falls evaluation completed Falls evaluation completed Falls evaluation completed Falls evaluation completed   Functional Status Survey:    Vitals:   10/22/22 1015 10/22/22 1016  BP: (!) 114/58 (!) 144/58  Pulse: 66   Resp: 16   Temp: (!) 97.1 F (36.2 C)   SpO2: 94%   Weight: 166 lb 9.6 oz (75.6 kg)   Height: 5\' 4"  (1.626 m)    Body mass index is 28.6 kg/m. Physical Exam  Labs reviewed: Recent Labs    11/05/21 0000 09/07/22 0000  NA 142 140  K 3.5 4.2  CL 103 102  CO2 29* 32*  BUN 27* 28*  CREATININE 1.4* 1.5*  CALCIUM 8.9 9.1   Recent Labs    09/07/22 0000  AST 17  ALT 13  ALBUMIN 3.4*   Recent Labs    09/07/22 0000  WBC 6.0  NEUTROABS 3,360.00  HGB 13.8  HCT 41  PLT 189   Lab Results  Component Value Date   TSH 2.91 09/07/2022   No results found for: "HGBA1C" Lab Results  Component Value Date   CHOL 183 05/05/2020   HDL 54 05/05/2020   LDLCALC 107 (H) 05/05/2020   TRIG 128 05/05/2020   CHOLHDL 3.4 05/05/2020    Significant Diagnostic Results in last 30 days:  No results found.  Assessment/Plan There are no diagnoses linked to this encounter.   Family/ staff Communication: ***  Labs/tests ordered:  ***

## 2022-10-25 NOTE — Progress Notes (Deleted)
This encounter was created in error - please disregard.

## 2022-11-15 ENCOUNTER — Encounter: Payer: Self-pay | Admitting: Nurse Practitioner

## 2022-11-15 ENCOUNTER — Non-Acute Institutional Stay (SKILLED_NURSING_FACILITY): Payer: Medicare PPO | Admitting: Nurse Practitioner

## 2022-11-15 DIAGNOSIS — I480 Paroxysmal atrial fibrillation: Secondary | ICD-10-CM | POA: Diagnosis not present

## 2022-11-15 DIAGNOSIS — N183 Chronic kidney disease, stage 3 unspecified: Secondary | ICD-10-CM | POA: Diagnosis not present

## 2022-11-15 DIAGNOSIS — E039 Hypothyroidism, unspecified: Secondary | ICD-10-CM

## 2022-11-15 DIAGNOSIS — R635 Abnormal weight gain: Secondary | ICD-10-CM

## 2022-11-15 NOTE — Assessment & Plan Note (Signed)
takes Levothyroxine, TSH 2.91 09/07/22

## 2022-11-15 NOTE — Assessment & Plan Note (Addendum)
reported with gained about #5.5 Ibs from the past month, the same weight when comparing to weight a year ago 11/19/21. The received extra dose of Torsemide 20mg  11/13/22, placed on daily weight x 2 weeks. The patient denied cough, change from his baseline DOE, chest pain/pressure, or palpitation. Will monitor.

## 2022-11-15 NOTE — Progress Notes (Signed)
Location:   [facility] Room Number: 64 Place of Service:  SNF (31) Provider: Arna Snipe [place] Mung[place]ia NP  Davidjames Blansett X, NP  Patient Care Team: Tasheika Kitzmiller X, NP as PCP - General (Internal Medicine) Lyn Records, MD (Inactive) as PCP - Cardiology (Cardiology)  Extended Emergency Contact Information Primary Emergency Contact: Zeiders,Jean Address: [address]          [place], [place] Home Phone: [phone] Relation: Spouse Secondary Emergency Contact: Lorretta Harp Address: [address]          [place], [place] [address] [place] Home Phone: [phone] Relation: Daughter  Code Status: DNR Goals of care: Advanced Directive information    10/18/2022   10:21 AM  Advanced Directives  Does Patient Have a Medical Advance Directive? Yes  Type of Advance Directive Living will;Out of facility DNR (pink MOST or yellow form)  Does patient want to make changes to medical advance directive? No - Patient declined  Pre-existing out of facility DNR order (yellow form or pink MOST form) Pink MOST form placed in chart (order not valid for inpatient use);Yellow form placed in chart (order not valid for inpatient use)     Chief Complaint  Patient presents with   Acute Visit    Weight gain    HPI:  Pt is a 89 y.o. male seen today for an acute visit for reported with gained about #5.5 Ibs from the past month, the same weight when comparing to weight a year ago 11/19/21. The received extra dose of Torsemide 20mg  11/13/22, placed on daily weight x 2 weeks. The patient denied cough, no change of DOE, chest pain/pressure, or palpitation.              Chronic blepharitis/lower eyelids ectropion, mild crusted eyelashes, patient declined ophthalmic ointment.               Afib, tachy brady, refused PPM, heart rate is in control. EKG 12/05/19 Afib at cardiology, only takes ASA             Thrombophilia, had work up in the past, venous US negative DVT in legs.                Urinary frequency, no difference since off Mirabegran due to cost, had urinary retention, s/p ED eval 11/22/20, f/u urology             CAD, on ASA, Fish oil, Coronary artery bypass grafting 1992             Hx of GI bleed, Hgb 13.8 09/07/22             CVA, slightly left sided weakness with muscle strength 5/5             HTN, controlled, on Torsemide.  Pulmonary hypertension/edema BLE, takes Torsemide, [place] 6/18/21negative DVT, last echo 2018 EF 55-60% Insomnia, takes Zolpidem             Hypothyroidism, takes Levothyroxine, TSH 2.91 09/07/22             CKD Bun/creat 28/1.5 09/07/22             Hyperlipidemia, LDL 107 05/05/20, on Omega 3 supplement             Constipation, takes Colace.      Past Medical History:  Diagnosis Date   Atrial fibrillation (HCC)    CAD (coronary artery disease)    Diverticulosis  Hypercholesteremia    Hypertension    Hypothyroidism    Insomnia    Murmur    Paroxysmal atrial fibrillation (HCC)    Urinary dysfunction    Past Surgical History:  Procedure Laterality Date   BYPASS GRAFT     HEMORRHOID SURGERY     HERNIA REPAIR     HIP SURGERY      Allergies  Allergen Reactions   Cranberry Extract Cough   Shellfish Allergy Swelling and Rash    Allergies as of 11/15/2022       Reactions   Cranberry Extract Cough   Shellfish Allergy Swelling, Rash        Medication List        Accurate as of November 15, 2022 12:46 PM. If you have any questions, ask your nurse or doctor.          aspirin EC 81 MG tablet in the morning and at bedtime.   cholecalciferol 1000 units tablet Commonly known as: VITAMIN D Take 1,000 Units by mouth daily.   docusate sodium 100 MG capsule Commonly known as: COLACE Take 200 mg by mouth at bedtime.   levothyroxine 50 MCG tablet Commonly known as: SYNTHROID Take 50 mcg by mouth daily before breakfast.   nystatin powder Commonly known as: MYCOSTATIN/NYSTOP Apply 1 Application topically 2 (two) times  daily. Apply to (L) groin area topically two times a day related to RASH AND OTHER NONSPECIFIC SKIN ERUPTION   omega-3 acid ethyl esters 1 g capsule Commonly known as: LOVAZA Take 1 g by mouth 2 (two) times daily.   PEG 3350 17 GM/SCOOP Powd Give 17gm one time daily.   potassium chloride 10 MEQ CR capsule Commonly known as: MICRO-K Take 10 mEq by mouth daily.   PRESERVISION AREDS 2 PO Take by mouth daily.   Therems-M Tabs Take 1 tablet by mouth daily.   saccharomyces boulardii 250 MG capsule Commonly known as: FLORASTOR Take 250 mg by mouth daily.   Systane 0.4-0.3 % Soln Generic drug: Polyethyl Glycol-Propyl Glycol Place 2 drops into the left eye at bedtime.   torsemide 20 MG tablet Commonly known as: DEMADEX Take 20 mg by mouth daily.   vitamin C 1000 MG tablet Take 1,000 mg by mouth in the morning and at bedtime.   zolpidem 5 MG tablet Commonly known as: AMBIEN Take 1 tablet (5 mg total) by mouth at bedtime. TAKE ONE TABLET AT BEDTIME        Review of Systems  Constitutional:  Negative for activity change, appetite change and fever.  HENT:  Positive for hearing loss and trouble swallowing. Negative for congestion and voice change.   Respiratory:  Positive for shortness of breath. Negative for cough and wheezing.        Occasional nocturnal orthopnea at his baseline.   Cardiovascular:  Positive for leg swelling. Negative for chest pain and palpitations.  Gastrointestinal:  Negative for abdominal pain and constipation.  Genitourinary:  Positive for frequency. Negative for difficulty urinating and urgency.  Musculoskeletal:  Positive for arthralgias and gait problem.  Skin:  Negative for color change.  Neurological:  Negative for speech difficulty, weakness and headaches.  Psychiatric/Behavioral:  Negative for confusion and sleep disturbance. The patient is not nervous/anxious.     Immunization History  Administered Date(s) Administered   Influenza, High  Dose Seasonal PF 04/11/2017, 03/26/2020   Influenza-Unspecified 03/26/2020, 04/01/2021, 04/12/2022   Moderna SARS-COV2 Booster Vaccination 04/06/2020   Moderna Sars-Covid-2 Vaccination 06/18/2019, 07/16/2019, 10/30/2021   PFIZER(Purple  Top)SARS-COV-2 Vaccination 09/14/2020, 03/03/2021   Pfizer Covid-19 Vaccine Bivalent Booster 60yrs & up 04/15/2022   Pneumococcal-Unspecified 08/12/2012   Tetanus 06/14/2004   Zoster Recombinat (Shingrix) 02/03/2017, 06/02/2017   Pertinent  Health Maintenance Due  Topic Date Due   INFLUENZA VACCINE  01/13/2023      06/21/2022   12:27 PM 06/22/2022   11:47 AM 07/20/2022   10:38 AM 09/07/2022   10:32 AM 10/18/2022   10:21 AM  Fall Risk  Falls in the past year? 0 0 0 1 1  Was there an injury with Fall? 0 0 0 0 0  Fall Risk Category Calculator 0 0 0 2 2  Fall Risk Category (Retired) Low Low     (RETIRED) Patient Fall Risk Level Low fall risk Low fall risk     Patient at Risk for Falls Due to History of fall(s) History of fall(s) History of fall(s) History of fall(s);Impaired balance/gait;Impaired mobility History of fall(s);Impaired balance/gait  Fall risk Follow up  Falls evaluation completed Falls evaluation completed Falls evaluation completed Falls evaluation completed   Functional Status Survey:    Vitals:   11/15/22 1234  BP: 118/69  Pulse: (!) 56  Resp: 16  Temp: 97.7 F (36.5 C)  SpO2: 96%  Weight: 167 lb 9.6 oz (76 kg)   Body mass index is 28.77 kg/m. Physical Exam Vitals and nursing note reviewed.  Constitutional:      Appearance: Normal appearance.  HENT:     Head: Normocephalic and atraumatic.     Mouth/Throat:     Mouth: Mucous membranes are moist.  Eyes:     Extraocular Movements: Extraocular movements intact.     Conjunctiva/sclera: Conjunctivae normal.     Pupils: Pupils are equal, round, and reactive to light.     Comments: R+L lower eyelid ectropion, mild crusted eyelashes.   Cardiovascular:     Rate and Rhythm: Normal  rate. Rhythm irregular.     Heart sounds: Murmur heard.  Pulmonary:     Effort: Pulmonary effort is normal.     Breath sounds: No rales.  Abdominal:     General: Bowel sounds are normal.     Palpations: Abdomen is soft.     Tenderness: There is no abdominal tenderness.  Musculoskeletal:     Cervical back: Normal range of motion and neck supple.     Right lower leg: Edema present.     Left lower leg: Edema present.     Comments: 1-2 + edema BLE L>R, uses TED sometimes.  Skin:    General: Skin is warm and dry.     Findings: No erythema or rash.  Neurological:     General: No focal deficit present.     Mental Status: He is alert and oriented to person, place, and time. Mental status is at baseline.     Motor: Weakness present.     Coordination: Coordination normal.     Gait: Gait abnormal.     Comments: Left sided weakness form previous CVA, muscle strength 5/5  Psychiatric:        Mood and Affect: Mood normal.        Behavior: Behavior normal.        Thought Content: Thought content normal.        Judgment: Judgment normal.     Labs reviewed: Recent Labs    09/07/22 0000  NA 140  K 4.2  CL 102  CO2 32*  BUN 28*  CREATININE 1.5*  CALCIUM 9.1  Recent Labs    09/07/22 0000  AST 17  ALT 13  ALBUMIN 3.4*   Recent Labs    09/07/22 0000  WBC 6.0  NEUTROABS 3,360.00  HGB 13.8  HCT 41  PLT 189   Lab Results  Component Value Date   TSH 2.91 09/07/2022   No results found for: "HGBA1C" Lab Results  Component Value Date   CHOL 183 05/05/2020   HDL 54 05/05/2020   LDLCALC 107 (H) 05/05/2020   TRIG 128 05/05/2020   CHOLHDL 3.4 05/05/2020    Significant Diagnostic Results in last 30 days:  No results found.  Assessment/Plan: Weight gain  reported with gained about #5.5 Ibs from the past month, the same weight when comparing to weight a year ago 11/19/21. The received extra dose of Torsemide 20mg  11/13/22, placed on daily weight x 2 weeks. The patient denied  cough, change from his baseline DOE, chest pain/pressure, or palpitation. Will monitor.   Paroxysmal atrial fibrillation  tachy brady, refused PPM, heart rate is in control. EKG 12/05/19 Afib at cardiology, only takes ASA  Hypothyroidism  takes Levothyroxine, TSH 2.91 09/07/22  CKD (chronic kidney disease) stage 3, GFR 30-59 ml/min (HCC) Bun/creat 28/1.5 09/07/22    Family/ staff Communication: plan of care reviewed with the patient and charge nurse.   Labs/tests ordered:  none  Time spend 35 minutes.

## 2022-11-15 NOTE — Assessment & Plan Note (Signed)
tachy brady, refused PPM, heart rate is in control. EKG 12/05/19 Afib at cardiology, only takes ASA 

## 2022-11-15 NOTE — Assessment & Plan Note (Signed)
Bun/creat 28/1.5 09/07/22 

## 2022-11-23 ENCOUNTER — Non-Acute Institutional Stay (SKILLED_NURSING_FACILITY): Payer: Medicare PPO | Admitting: Family Medicine

## 2022-11-23 DIAGNOSIS — H01002 Unspecified blepharitis right lower eyelid: Secondary | ICD-10-CM

## 2022-11-23 DIAGNOSIS — I10 Essential (primary) hypertension: Secondary | ICD-10-CM | POA: Diagnosis not present

## 2022-11-23 DIAGNOSIS — I2510 Atherosclerotic heart disease of native coronary artery without angina pectoris: Secondary | ICD-10-CM | POA: Diagnosis not present

## 2022-11-23 DIAGNOSIS — H01005 Unspecified blepharitis left lower eyelid: Secondary | ICD-10-CM

## 2022-11-23 DIAGNOSIS — I480 Paroxysmal atrial fibrillation: Secondary | ICD-10-CM

## 2022-11-23 DIAGNOSIS — I350 Nonrheumatic aortic (valve) stenosis: Secondary | ICD-10-CM | POA: Diagnosis not present

## 2022-11-23 NOTE — Progress Notes (Signed)
Provider:  Jacalyn Lefevre, MD Location:      Place of Service:     PCP: Mast, Man X, NP Patient Care Team: Mast, Man X, NP as PCP - General (Internal Medicine) Lyn Records, MD (Inactive) as PCP - Cardiology (Cardiology)  Extended Emergency Contact Information Primary Emergency Contact: Laurie Panda Address: [address]          [place], [place] Home Phone: [phone] Relation: Spouse Secondary Emergency Contact: Lorretta Harp Address: [address]          [place], [place] [address] [place] Home Phone: [phone] Relation: Daughter  Code Status:  Goals of Care: Advanced Directive information    10/18/2022   10:21 AM  Advanced Directives  Does Patient Have a Medical Advance Directive? Yes  Type of Advance Directive Living will;Out of facility DNR (pink MOST or yellow form)  Does patient want to make changes to medical advance directive? No - Patient declined  Pre-existing out of facility DNR order (yellow form or pink MOST form) Pink MOST form placed in chart (order not valid for inpatient use);Yellow form placed in chart (order not valid for inpatient use)      No chief complaint on file.   HPI: Patient is a 89 y.o. male seen today for medical management of chronic problems including aortic stenosis, coronary artery disease, edema, A-fib, and hypothyroidism. Patient celebrated his 89th birthday several weeks ago. He denies any change in symptoms.  He continues to spend most of the day in his room coming out only for meals.  He has a daughter who visits regularly.  Continues to sleep well.  Has good appetite.  Was seen recently for weight gain probably related to fluid retention.  He is on torsemide but spends most of his day sitting, albeit with his feet elevated.  He denies chest pain.  He has had coronary artery bypass grafting in 1992.  Also has A-fib, but not on rate controlling drug and only aspirin for  anticoagulation.  Past Medical History:  Diagnosis Date   Atrial fibrillation (HCC)    CAD (coronary artery disease)    Diverticulosis    Hypercholesteremia    Hypertension    Hypothyroidism    Insomnia    Murmur    Paroxysmal atrial fibrillation (HCC)    Urinary dysfunction    Past Surgical History:  Procedure Laterality Date   BYPASS GRAFT     HEMORRHOID SURGERY     HERNIA REPAIR     HIP SURGERY      reports that he has quit smoking. He has never used smokeless tobacco. He reports current alcohol use. He reports that he does not use drugs. Social History   Socioeconomic History   Marital status: Married    Spouse name: Not on file   Number of children: Not on file   Years of education: Not on file   Highest education level: Not on file  Occupational History   Not on file  Tobacco Use   Smoking status: Former   Smokeless tobacco: Never  Substance and Sexual Activity   Alcohol use: Yes    Comment: occasional   Drug use: No   Sexual activity: Not Currently  Other Topics Concern   Not on file  Social History Narrative   Social History     Tobacco Use       Smoking status: None       Smokeless tobacco: Never Used  Alcohol use: No       Alcohol/week: 0.0 standard drinks     Drug use: No   Diet is good   No caffeine   Married since 1952   Lives in apartment, one story   Lives with full time home health   Pet- Cat (Tai)   Highest level of education: PHD   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   No difficulty affording medications   Social Determinants of Health   Financial Resource Strain: Not on file  Food Insecurity: Not on file  Transportation Needs: Not on file  Physical Activity: Not on file  Stress: Not on file  Social Connections: Not on file  Intimate Partner  Violence: Not on file    Functional Status Survey:    Family History  Problem Relation Age of Onset   Heart disease Mother    Cancer Father    ALS Brother    Heart disease Daughter     Health Maintenance  Topic Date Due   COVID-19 Vaccine (8 - 2023-24 season) 06/10/2022   INFLUENZA VACCINE  01/13/2023   Medicare Annual Wellness (AWV)  06/23/2023   Pneumonia Vaccine 23+ Years old  Completed   Zoster Vaccines- Shingrix  Completed   HPV VACCINES  Aged Out   DTaP/Tdap/Td  Discontinued    Allergies  Allergen Reactions   Cranberry Extract Cough   Shellfish Allergy Swelling and Rash    Outpatient Encounter Medications as of 11/23/2022  Medication Sig   Ascorbic Acid (VITAMIN C) 1000 MG tablet Take 1,000 mg by mouth in the morning and at bedtime.   aspirin 81 MG EC tablet in the morning and at bedtime.    cholecalciferol (VITAMIN D) 1000 UNITS tablet Take 1,000 Units by mouth daily.   docusate sodium (COLACE) 100 MG capsule Take 200 mg by mouth at bedtime.   levothyroxine (SYNTHROID) 50 MCG tablet Take 50 mcg by mouth daily before breakfast.   Multiple Vitamins-Minerals (PRESERVISION AREDS 2 PO) Take by mouth daily.   Multiple Vitamins-Minerals (THEREMS-M) TABS Take 1 tablet by mouth daily.   nystatin (MYCOSTATIN/NYSTOP) powder Apply 1 Application topically 2 (two) times daily. Apply to (L) groin area topically two times a day related to RASH AND OTHER NONSPECIFIC SKIN ERUPTION   omega-3 acid ethyl esters (LOVAZA) 1 g capsule Take 1 g by mouth 2 (two) times daily.   Polyethyl Glycol-Propyl Glycol (SYSTANE) 0.4-0.3 % SOLN Place 2 drops into the left eye at bedtime.   Polyethylene Glycol 3350 (PEG 3350) 17 GM/SCOOP POWD Give 17gm one time daily.   potassium chloride (MICRO-K) 10 MEQ CR capsule Take 10 mEq by mouth daily.   saccharomyces boulardii (FLORASTOR) 250 MG capsule Take 250 mg by mouth daily.   torsemide (DEMADEX) 20 MG tablet Take 20 mg by mouth daily.   zolpidem  (AMBIEN) 5 MG tablet Take 1 tablet (5 mg total) by mouth at bedtime. TAKE ONE TABLET AT BEDTIME   No facility-administered encounter medications on file as of 11/23/2022.    Review of Systems  Constitutional: Negative.   HENT:  Positive for dental problem and hearing loss.   Eyes:  Positive for redness.  Respiratory: Negative.    Cardiovascular:  Positive for leg swelling.  Gastrointestinal: Negative.   Genitourinary: Negative.   Musculoskeletal:  Positive  for gait problem.  Psychiatric/Behavioral: Negative.    All other systems reviewed and are negative.   There were no vitals filed for this visit. There is no height or weight on file to calculate BMI. Physical Exam Vitals and nursing note reviewed.  Constitutional:      Appearance: Normal appearance.  HENT:     Mouth/Throat:     Mouth: Mucous membranes are moist.     Pharynx: Oropharynx is clear.  Eyes:     Pupils: Pupils are equal, round, and reactive to light.  Cardiovascular:     Rate and Rhythm: Normal rate. Rhythm irregular.     Heart sounds: Murmur heard.  Pulmonary:     Effort: Pulmonary effort is normal.     Breath sounds: Normal breath sounds.  Abdominal:     General: Bowel sounds are normal.     Palpations: Abdomen is soft.  Neurological:     General: No focal deficit present.     Mental Status: He is alert and oriented to person, place, and time.     Labs reviewed: Basic Metabolic Panel: Recent Labs    09/07/22 0000  NA 140  K 4.2  CL 102  CO2 32*  BUN 28*  CREATININE 1.5*  CALCIUM 9.1   Liver Function Tests: Recent Labs    09/07/22 0000  AST 17  ALT 13  ALBUMIN 3.4*   No results for input(s): "LIPASE", "AMYLASE" in the last 8760 hours. No results for input(s): "AMMONIA" in the last 8760 hours. CBC: Recent Labs    09/07/22 0000  WBC 6.0  NEUTROABS 3,360.00  HGB 13.8  HCT 41  PLT 189   Cardiac Enzymes: No results for input(s): "CKTOTAL", "CKMB", "CKMBINDEX", "TROPONINI" in the  last 8760 hours. BNP: Invalid input(s): "POCBNP" No results found for: "HGBA1C" Lab Results  Component Value Date   TSH 2.91 09/07/2022   No results found for: "VITAMINB12" No results found for: "FOLATE" No results found for: "IRON", "TIBC", "FERRITIN"  Imaging and Procedures obtained prior to SNF admission: No results found.  Assessment/Plan 1. Nonrheumatic aortic valve stenosis Has been followed previously by cardiology no current intervention no history of chest pain or syncope  2. Blepharitis of lower eyelids of both eyes, unspecified type Clines use of the ointment for condition  3. Coronary artery disease involving native coronary artery of native heart without angina pectoris No recent angina.  Heart rate control.  On aspirin  4. Primary hypertension Only med is torsemide that he takes for edema as well  5. Paroxysmal atrial fibrillation (HCC) On no medications except aspirin    Family/ staff Communication:   Labs/tests ordered:  Bertram Millard. Hyacinth Meeker, MD [facility] [address] [place], [place] [address] Office [phone]

## 2022-12-02 ENCOUNTER — Encounter: Payer: Self-pay | Admitting: Nurse Practitioner

## 2022-12-02 ENCOUNTER — Non-Acute Institutional Stay (SKILLED_NURSING_FACILITY): Payer: Medicare PPO | Admitting: Nurse Practitioner

## 2022-12-02 DIAGNOSIS — I2510 Atherosclerotic heart disease of native coronary artery without angina pectoris: Secondary | ICD-10-CM | POA: Diagnosis not present

## 2022-12-02 DIAGNOSIS — I10 Essential (primary) hypertension: Secondary | ICD-10-CM

## 2022-12-02 DIAGNOSIS — R350 Frequency of micturition: Secondary | ICD-10-CM | POA: Diagnosis not present

## 2022-12-02 DIAGNOSIS — Z8719 Personal history of other diseases of the digestive system: Secondary | ICD-10-CM

## 2022-12-02 DIAGNOSIS — I480 Paroxysmal atrial fibrillation: Secondary | ICD-10-CM

## 2022-12-02 DIAGNOSIS — I6300 Cerebral infarction due to thrombosis of unspecified precerebral artery: Secondary | ICD-10-CM

## 2022-12-02 DIAGNOSIS — I270 Primary pulmonary hypertension: Secondary | ICD-10-CM

## 2022-12-02 LAB — CBC AND DIFFERENTIAL
HCT: 42 (ref 41–53)
Hemoglobin: 14.2 (ref 13.5–17.5)
Neutrophils Absolute: 6280
Platelets: 212 10*3/uL (ref 150–400)
WBC: 8

## 2022-12-02 LAB — CBC: RBC: 4.39 (ref 3.87–5.11)

## 2022-12-02 NOTE — Assessment & Plan Note (Addendum)
Pulmonary hypertension/edema BLE, takes Torsemide, [place] 6/18/21negative DVT, last echo 2018 EF 55-60%  cough, congestion, increased Torsemide x 3 days w/o significant improvement, no O2 desaturation, afebrile, admitted central chest pain with cough, denied palpitation or feeling of impending doom.  DuoNeb q8hrs x7 days, Doxycycline 100mg  bid x 7days, obtain CXR ap/lateral, CBC/diff, CMP/eGFR 12/02/22 CXR no infiltrate, effusion, or acute findings. Wbc 8.0, Hgb 14.2, plt 212, neutrophils 78.5

## 2022-12-02 NOTE — Assessment & Plan Note (Signed)
on ASA, Fish oil, Coronary artery bypass grafting 1992 

## 2022-12-02 NOTE — Assessment & Plan Note (Signed)
tachy brady, refused PPM, heart rate is in control. EKG 12/05/19 Afib at cardiology, only takes ASA 

## 2022-12-02 NOTE — Assessment & Plan Note (Signed)
no difference since off Mirabegran due to cost, had urinary retention, s/p ED eval 11/22/20, f/u urology 

## 2022-12-02 NOTE — Assessment & Plan Note (Signed)
takes Levothyroxine, TSH 2.91 09/07/22 

## 2022-12-02 NOTE — Assessment & Plan Note (Signed)
slightly left sided weakness with muscle strength 5/5 

## 2022-12-02 NOTE — Progress Notes (Signed)
Location:  [facility] Room Number: NO/64/A Place of Service:  SNF (31) Provider:  Dammon Makarewicz X, NP  Patient Care Team: Brandonlee Navis X, NP as PCP - General (Internal Medicine) Lyn Records, MD (Inactive) as PCP - Cardiology (Cardiology)  Extended Emergency Contact Information Primary Emergency Contact: Kite,Jean Address: [address]          [place], [place] Home Phone: [phone] Relation: Spouse Secondary Emergency Contact: Lorretta Harp Address: [address]          [place], [place] [address] [place] Home Phone: [phone] Relation: Daughter  Code Status:  DNR Goals of care: Advanced Directive information    10/18/2022   10:21 AM  Advanced Directives  Does Patient Have a Medical Advance Directive? Yes  Type of Advance Directive Living will;Out of facility DNR (pink MOST or yellow form)  Does patient want to make changes to medical advance directive? No - Patient declined  Pre-existing out of facility DNR order (yellow form or pink MOST form) Pink MOST form placed in chart (order not valid for inpatient use);Yellow form placed in chart (order not valid for inpatient use)     Chief Complaint  Patient presents with   Acute Visit    Patient is being seen for a cough     HPI:  Pt is a 89 y.o. male seen today for an acute visit for cough, congestion, increased Torsemide x 3 days w/o significant improvement, no O2 desaturation, afebrile, admitted central chest pain with cough, denied palpitation or feeling of impending doom.     Chronic blepharitis/lower eyelids ectropion, mild crusted eyelashes, patient declined ophthalmic ointment.               Afib, tachy brady, refused PPM, heart rate is in control. EKG 12/05/19 Afib at cardiology, only takes ASA             Thrombophilia, had work up in the past, venous US negative DVT in legs.               Urinary frequency, no difference since off Mirabegran due to  cost, had urinary retention, s/p ED eval 11/22/20, f/u urology             CAD, on ASA, Fish oil, Coronary artery bypass grafting 1992             Hx of GI bleed, Hgb 13.8 09/07/22             CVA, slightly left sided weakness with muscle strength 5/5             HTN, controlled, on Torsemide.  Pulmonary hypertension/edema BLE, takes Torsemide, [place] 6/18/21negative DVT, last echo 2018 EF 55-60% Insomnia, takes Zolpidem             Hypothyroidism, takes Levothyroxine, TSH 2.91 09/07/22             CKD Bun/creat 28/1.5 09/07/22             Hyperlipidemia, LDL 107 05/05/20, on Omega 3 supplement             Constipation, takes Colace.     Past Medical History:  Diagnosis Date   Atrial fibrillation (HCC)    CAD (coronary artery disease)    Diverticulosis    Hypercholesteremia    Hypertension    Hypothyroidism    Insomnia    Murmur    Paroxysmal atrial fibrillation (HCC)  Urinary dysfunction    Past Surgical History:  Procedure Laterality Date   BYPASS GRAFT     HEMORRHOID SURGERY     HERNIA REPAIR     HIP SURGERY      Allergies  Allergen Reactions   Cranberry Extract Cough   Shellfish Allergy Swelling and Rash    Outpatient Encounter Medications as of 12/02/2022  Medication Sig   Ascorbic Acid (VITAMIN C) 1000 MG tablet Take 1,000 mg by mouth in the morning and at bedtime.   aspirin 81 MG EC tablet in the morning and at bedtime.    cholecalciferol (VITAMIN D) 1000 UNITS tablet Take 1,000 Units by mouth daily.   docusate sodium (COLACE) 100 MG capsule Take 200 mg by mouth at bedtime.   levothyroxine (SYNTHROID) 50 MCG tablet Take 50 mcg by mouth daily before breakfast.   Multiple Vitamins-Minerals (PRESERVISION AREDS 2 PO) Take by mouth daily.   Multiple Vitamins-Minerals (THEREMS-M) TABS Take 1 tablet by mouth daily.   nystatin (MYCOSTATIN/NYSTOP) powder Apply 1 Application topically 2 (two) times daily. Apply to (L) groin area topically two times a day related to RASH  AND OTHER NONSPECIFIC SKIN ERUPTION   omega-3 acid ethyl esters (LOVAZA) 1 g capsule Take 1 g by mouth 2 (two) times daily.   Polyethyl Glycol-Propyl Glycol (SYSTANE) 0.4-0.3 % SOLN Place 2 drops into the left eye at bedtime.   Polyethylene Glycol 3350 (PEG 3350) 17 GM/SCOOP POWD Give 17gm one time daily.   potassium chloride (MICRO-K) 10 MEQ CR capsule Take 10 mEq by mouth daily.   saccharomyces boulardii (FLORASTOR) 250 MG capsule Take 250 mg by mouth daily.   torsemide (DEMADEX) 20 MG tablet Take 20 mg by mouth daily.   zolpidem (AMBIEN) 5 MG tablet Take 1 tablet (5 mg total) by mouth at bedtime. TAKE ONE TABLET AT BEDTIME   No facility-administered encounter medications on file as of 12/02/2022.    Review of Systems  Constitutional:  Positive for fatigue. Negative for appetite change and fever.  HENT:  Positive for congestion, hearing loss and trouble swallowing. Negative for sinus pressure, sore throat and voice change.   Respiratory:  Positive for cough and shortness of breath. Negative for wheezing.        Occasional nocturnal orthopnea at his baseline.   Cardiovascular:  Positive for leg swelling. Negative for chest pain and palpitations.  Gastrointestinal:  Negative for abdominal pain and constipation.  Genitourinary:  Positive for frequency. Negative for difficulty urinating and urgency.  Musculoskeletal:  Positive for arthralgias and gait problem.  Skin:  Negative for color change.  Neurological:  Negative for speech difficulty, weakness and headaches.  Psychiatric/Behavioral:  Negative for confusion and sleep disturbance. The patient is not nervous/anxious.     Immunization History  Administered Date(s) Administered   Influenza, High Dose Seasonal PF 04/11/2017, 03/26/2020   Influenza-Unspecified 03/26/2020, 04/01/2021, 04/12/2022   Moderna SARS-COV2 Booster Vaccination 04/06/2020   Moderna Sars-Covid-2 Vaccination 06/18/2019, 07/16/2019, 10/30/2021   PFIZER(Purple  Top)SARS-COV-2 Vaccination 09/14/2020, 03/03/2021   Pfizer Covid-19 Vaccine Bivalent Booster 34yrs & up 04/15/2022   Pneumococcal-Unspecified 08/12/2012   Tetanus 06/14/2004   Zoster Recombinat (Shingrix) 02/03/2017, 06/02/2017   Pertinent  Health Maintenance Due  Topic Date Due   INFLUENZA VACCINE  01/13/2023      06/21/2022   12:27 PM 06/22/2022   11:47 AM 07/20/2022   10:38 AM 09/07/2022   10:32 AM 10/18/2022   10:21 AM  Fall Risk  Falls in the past year? 0 0  0 1 1  Was there an injury with Fall? 0 0 0 0 0  Fall Risk Category Calculator 0 0 0 2 2  Fall Risk Category (Retired) Low Low     (RETIRED) Patient Fall Risk Level Low fall risk Low fall risk     Patient at Risk for Falls Due to History of fall(s) History of fall(s) History of fall(s) History of fall(s);Impaired balance/gait;Impaired mobility History of fall(s);Impaired balance/gait  Fall risk Follow up  Falls evaluation completed Falls evaluation completed Falls evaluation completed Falls evaluation completed   Functional Status Survey:    Vitals:   12/02/22 1051  BP: (!) 107/56  Pulse: (!) 53  Resp: 16  Temp: 97.7 F (36.5 C)  SpO2: 96%  Weight: 172 lb 1.6 oz (78.1 kg)  Height: 5\' 4"  (1.626 m)   Body mass index is 29.54 kg/m. Physical Exam Vitals and nursing note reviewed.  Constitutional:      Appearance: He is ill-appearing.  HENT:     Head: Normocephalic and atraumatic.     Mouth/Throat:     Mouth: Mucous membranes are moist.  Eyes:     Extraocular Movements: Extraocular movements intact.     Conjunctiva/sclera: Conjunctivae normal.     Pupils: Pupils are equal, round, and reactive to light.     Comments: R+L lower eyelid ectropion, mild crusted eyelashes.   Cardiovascular:     Rate and Rhythm: Normal rate. Rhythm irregular.     Heart sounds: Murmur heard.  Pulmonary:     Effort: Pulmonary effort is normal.     Breath sounds: Rales present.     Comments: Central congestion, right mid to lower lung  rales, bibasilar rales as well appreciated.  Abdominal:     General: Bowel sounds are normal.     Palpations: Abdomen is soft.     Tenderness: There is no abdominal tenderness.  Musculoskeletal:     Cervical back: Normal range of motion and neck supple.     Right lower leg: Edema present.     Left lower leg: Edema present.     Comments: 1-2 + edema BLE L>R, uses TED sometimes.  Skin:    General: Skin is warm and dry.     Findings: No erythema or rash.  Neurological:     General: No focal deficit present.     Mental Status: He is alert and oriented to person, place, and time. Mental status is at baseline.     Motor: Weakness present.     Coordination: Coordination normal.     Gait: Gait abnormal.     Comments: Left sided weakness form previous CVA, muscle strength 5/5  Psychiatric:        Mood and Affect: Mood normal.        Behavior: Behavior normal.        Thought Content: Thought content normal.        Judgment: Judgment normal.     Labs reviewed: Recent Labs    09/07/22 0000  NA 140  K 4.2  CL 102  CO2 32*  BUN 28*  CREATININE 1.5*  CALCIUM 9.1   Recent Labs    09/07/22 0000  AST 17  ALT 13  ALBUMIN 3.4*   Recent Labs    09/07/22 0000  WBC 6.0  NEUTROABS 3,360.00  HGB 13.8  HCT 41  PLT 189   Lab Results  Component Value Date   TSH 2.91 09/07/2022   No results found for: "HGBA1C" Lab Results  Component Value Date   CHOL 183 05/05/2020   HDL 54 05/05/2020   LDLCALC 107 (H) 05/05/2020   TRIG 128 05/05/2020   CHOLHDL 3.4 05/05/2020    Significant Diagnostic Results in last 30 days:  No results found.  Assessment/Plan Pulmonary hypertension, primary (HCC) Pulmonary hypertension/edema BLE, takes Torsemide, [place] 6/18/21negative DVT, last echo 2018 EF 55-60%  cough, congestion, increased Torsemide x 3 days w/o significant improvement, no O2 desaturation, afebrile, admitted central chest pain with cough, denied palpitation or feeling of impending  doom.  DuoNeb q8hrs x7 days, Doxycycline 100mg  bid x 7days, obtain CXR ap/lateral, CBC/diff, CMP/eGFR 12/02/22 CXR no infiltrate, effusion, or acute findings. Wbc 8.0, Hgb 14.2, plt 212, neutrophils 78.5  Paroxysmal atrial fibrillation tachy brady, refused PPM, heart rate is in control. EKG 12/05/19 Afib at cardiology, only takes ASA  Urinary frequency  no difference since off Mirabegran due to cost, had urinary retention, s/p ED eval 11/22/20, f/u urology  CAD (coronary artery disease) on ASA, Fish oil, Coronary artery bypass grafting 1992  History of GI bleed Hx of GI bleed, Hgb 13.8 09/07/22            CVA (cerebral vascular accident) (HCC) slightly left sided weakness with muscle strength 5/5  Hypertension controlled, on Torsemide.   Hypothyroidism takes Levothyroxine, TSH 2.91 09/07/22     Family/ staff Communication: plan of care reviewed with the patient, the patient's daughter, and charge nurse.   Labs/tests ordered:  CXR ap/lateral, CBC/diff, CMP/eGFR  Time spend 35 minutes.

## 2022-12-02 NOTE — Assessment & Plan Note (Signed)
Hx of GI bleed, Hgb 13.8 09/07/22

## 2022-12-02 NOTE — Assessment & Plan Note (Signed)
controlled, on Torsemide.  

## 2022-12-03 LAB — COMPREHENSIVE METABOLIC PANEL
Albumin: 3.3 — AB (ref 3.5–5.0)
Calcium: 8.8 (ref 8.7–10.7)
Globulin: 3.2
eGFR: 37

## 2022-12-03 LAB — BASIC METABOLIC PANEL
BUN: 33 — AB (ref 4–21)
CO2: 28 — AB (ref 13–22)
Chloride: 102 (ref 99–108)
Creatinine: 1.7 — AB (ref 0.6–1.3)
Glucose: 106
Potassium: 3.7 mEq/L (ref 3.5–5.1)
Sodium: 139 (ref 137–147)

## 2022-12-03 LAB — HEPATIC FUNCTION PANEL
ALT: 11 U/L (ref 10–40)
AST: 18 (ref 14–40)
Alkaline Phosphatase: 65 (ref 25–125)
Bilirubin, Total: 0.9

## 2022-12-17 ENCOUNTER — Encounter: Payer: Self-pay | Admitting: Nurse Practitioner

## 2022-12-17 ENCOUNTER — Non-Acute Institutional Stay (SKILLED_NURSING_FACILITY): Payer: Medicare PPO | Admitting: Nurse Practitioner

## 2022-12-17 DIAGNOSIS — I6300 Cerebral infarction due to thrombosis of unspecified precerebral artery: Secondary | ICD-10-CM

## 2022-12-17 DIAGNOSIS — Z8719 Personal history of other diseases of the digestive system: Secondary | ICD-10-CM

## 2022-12-17 DIAGNOSIS — N183 Chronic kidney disease, stage 3 unspecified: Secondary | ICD-10-CM

## 2022-12-17 DIAGNOSIS — I2510 Atherosclerotic heart disease of native coronary artery without angina pectoris: Secondary | ICD-10-CM

## 2022-12-17 DIAGNOSIS — I270 Primary pulmonary hypertension: Secondary | ICD-10-CM

## 2022-12-17 DIAGNOSIS — G4700 Insomnia, unspecified: Secondary | ICD-10-CM

## 2022-12-17 DIAGNOSIS — E039 Hypothyroidism, unspecified: Secondary | ICD-10-CM

## 2022-12-17 DIAGNOSIS — K5901 Slow transit constipation: Secondary | ICD-10-CM

## 2022-12-17 DIAGNOSIS — D6859 Other primary thrombophilia: Secondary | ICD-10-CM | POA: Diagnosis not present

## 2022-12-17 DIAGNOSIS — I480 Paroxysmal atrial fibrillation: Secondary | ICD-10-CM | POA: Diagnosis not present

## 2022-12-17 DIAGNOSIS — R350 Frequency of micturition: Secondary | ICD-10-CM

## 2022-12-17 DIAGNOSIS — I10 Essential (primary) hypertension: Secondary | ICD-10-CM

## 2022-12-17 DIAGNOSIS — E785 Hyperlipidemia, unspecified: Secondary | ICD-10-CM

## 2022-12-17 NOTE — Assessment & Plan Note (Signed)
no difference since off Mirabegran due to cost, had urinary retention, s/p ED eval 11/22/20, f/u urology 

## 2022-12-17 NOTE — Assessment & Plan Note (Signed)
tachy brady, refused PPM, heart rate is in control. EKG 12/05/19 Afib at cardiology, only takes ASA 

## 2022-12-17 NOTE — Assessment & Plan Note (Signed)
takes Zolpidem.  ?

## 2022-12-17 NOTE — Progress Notes (Signed)
Location:  [facility] Room Number: NO/[id number]/A Place of Service:  SNF (31) Provider:  Haley Roza X, NP  Patient Care Team: Lancelot Alyea X, NP as PCP - General (Internal Medicine) Lyn Records, MD (Inactive) as PCP - Cardiology (Cardiology)  Extended Emergency Contact Information Primary Emergency Contact: Nanez,Jean Address: [address]          [place], [place] Home Phone: [phone] Relation: Spouse Secondary Emergency Contact: Lorretta Harp Address: [address]          [place], [place] [address] [place] Home Phone: [phone] Relation: Daughter  Code Status:  DNR Goals of care: Advanced Directive information    12/17/2022    2:32 PM  Advanced Directives  Does Patient Have a Medical Advance Directive? Yes  Type of Advance Directive Living will;Out of facility DNR (pink MOST or yellow form)  Does patient want to make changes to medical advance directive? No - Patient declined  Pre-existing out of facility DNR order (yellow form or pink MOST form) Pink MOST form placed in chart (order not valid for inpatient use);Yellow form placed in chart (order not valid for inpatient use)     Chief Complaint  Patient presents with  . Medical Management of Chronic Issues    Patient is here for a follow up for chronic conditions     HPI:  Pt is a 89 y.o. male seen today for medical management of chronic diseases.     Cough, congestion, resolved, treated with 7 day courseof Doxy and Duoneb, CXR 12/02/22 no acute findings.              Chronic blepharitis/lower eyelids ectropion, mild crusted eyelashes, patient declined ophthalmic ointment.               Afib, tachy brady, refused PPM, heart rate is in control. EKG 12/05/19 Afib at cardiology, only takes ASA             Thrombophilia, had work up in the past, venous US negative DVT in legs.               Urinary frequency, no difference since off Mirabegran due to cost, had  urinary retention, s/p ED eval 11/22/20, f/u urology             CAD, on ASA, Fish oil, Coronary artery bypass grafting 1992             Hx of GI bleed, Hgb 14.2 12/02/22             CVA, slightly left sided weakness with muscle strength 5/5             HTN, controlled, on Torsemide.  Pulmonary hypertension/edema BLE, takes Torsemide, [place] 6/18/21negative DVT, last echo 2018 EF 55-60%, CXR 12/02/22 no acute findings.  Insomnia, takes Zolpidem             Hypothyroidism, takes Levothyroxine, TSH 2.91 09/07/22             CKD Bun/creat 33/1.65 12/02/22             Hyperlipidemia, LDL 107 05/05/20, on Omega 3 supplement             Constipation, takes Colace.     Past Medical History:  Diagnosis Date  . Atrial fibrillation (HCC)   . CAD (coronary artery disease)   . Diverticulosis   . Hypercholesteremia   . Hypertension   . Hypothyroidism   .  Insomnia   . Murmur   . Paroxysmal atrial fibrillation (HCC)   . Urinary dysfunction    Past Surgical History:  Procedure Laterality Date  . BYPASS GRAFT    . HEMORRHOID SURGERY    . HERNIA REPAIR    . HIP SURGERY      Allergies  Allergen Reactions  . Cranberry Extract Cough  . Shellfish Allergy Swelling and Rash    Outpatient Encounter Medications as of 12/17/2022  Medication Sig  . Ascorbic Acid (VITAMIN C) 1000 MG tablet Take 1,000 mg by mouth in the morning and at bedtime.  Marland Kitchen aspirin 81 MG EC tablet in the morning and at bedtime.   . cholecalciferol (VITAMIN D) 1000 UNITS tablet Take 1,000 Units by mouth daily.  Marland Kitchen docusate sodium (COLACE) 100 MG capsule Take 200 mg by mouth at bedtime.  Marland Kitchen levothyroxine (SYNTHROID) 50 MCG tablet Take 50 mcg by mouth daily before breakfast.  . Multiple Vitamins-Minerals (PRESERVISION AREDS 2 PO) Take by mouth daily.  . Multiple Vitamins-Minerals (THEREMS-M) TABS Take 1 tablet by mouth daily.  Marland Kitchen nystatin (MYCOSTATIN/NYSTOP) powder Apply 1 Application topically 2 (two) times daily. Apply to (L) groin area  topically two times a day related to RASH AND OTHER NONSPECIFIC SKIN ERUPTION  . omega-3 acid ethyl esters (LOVAZA) 1 g capsule Take 1 g by mouth 2 (two) times daily.  Bertram Gala Glycol-Propyl Glycol (SYSTANE) 0.4-0.3 % SOLN Place 2 drops into the left eye at bedtime.  . Polyethylene Glycol 3350 (PEG 3350) 17 GM/SCOOP POWD Give 17gm one time daily.  . potassium chloride (MICRO-K) 10 MEQ CR capsule Take 10 mEq by mouth daily.  Marland Kitchen saccharomyces boulardii (FLORASTOR) 250 MG capsule Take 250 mg by mouth daily.  Marland Kitchen torsemide (DEMADEX) 20 MG tablet Take 20 mg by mouth daily.  Marland Kitchen zolpidem (AMBIEN) 5 MG tablet Take 1 tablet (5 mg total) by mouth at bedtime. TAKE ONE TABLET AT BEDTIME   No facility-administered encounter medications on file as of 12/17/2022.    Review of Systems  Constitutional:  Negative for appetite change, fatigue and fever.  HENT:  Positive for hearing loss and trouble swallowing. Negative for congestion, sinus pressure, sore throat and voice change.   Respiratory:  Positive for shortness of breath. Negative for cough and wheezing.        Occasional nocturnal orthopnea at his baseline.   Cardiovascular:  Positive for leg swelling. Negative for chest pain and palpitations.  Gastrointestinal:  Negative for abdominal pain and constipation.  Genitourinary:  Positive for frequency. Negative for difficulty urinating and urgency.  Musculoskeletal:  Positive for arthralgias and gait problem.  Skin:  Negative for color change.  Neurological:  Negative for speech difficulty, weakness and headaches.  Psychiatric/Behavioral:  Negative for confusion and sleep disturbance. The patient is not nervous/anxious.     Immunization History  Administered Date(s) Administered  . Influenza, High Dose Seasonal PF 04/11/2017, 03/26/2020  . Influenza-Unspecified 03/26/2020, 04/01/2021, 04/12/2022  . Moderna SARS-COV2 Booster Vaccination 04/06/2020  . Moderna Sars-Covid-2 Vaccination 06/18/2019,  07/16/2019, 10/30/2021  . PFIZER(Purple Top)SARS-COV-2 Vaccination 09/14/2020, 03/03/2021  . [employer] 64yrs & up 04/15/2022  . Pneumococcal-Unspecified 08/12/2012  . Tetanus 06/14/2004  . Zoster Recombinant(Shingrix) 02/03/2017, 06/02/2017   Pertinent  Health Maintenance Due  Topic Date Due  . INFLUENZA VACCINE  01/13/2023      06/22/2022   11:47 AM 07/20/2022   10:38 AM 09/07/2022   10:32 AM 10/18/2022   10:21 AM 12/17/2022  2:32 PM  Fall Risk  Falls in the past year? 0 0 1 1 1   Was there an injury with Fall? 0 0 0 0 1  Fall Risk Category Calculator 0 0 2 2 3   Fall Risk Category (Retired) Low      (RETIRED) Patient Fall Risk Level Low fall risk      Patient at Risk for Falls Due to History of fall(s) History of fall(s) History of fall(s);Impaired balance/gait;Impaired mobility History of fall(s);Impaired balance/gait History of fall(s);Impaired balance/gait;Impaired mobility  Fall risk Follow up Falls evaluation completed Falls evaluation completed Falls evaluation completed Falls evaluation completed Falls evaluation completed   Functional Status Survey:    Vitals:   12/17/22 1431  BP: 115/60  Pulse: 72  Resp: 16  Temp: (!) 97.1 F (36.2 C)  SpO2: 95%  Weight: 166 lb 11.2 oz (75.6 kg)  Height: 5\' 4"  (1.626 m)   Body mass index is 28.61 kg/m. Physical Exam Vitals and nursing note reviewed.  Constitutional:      Appearance: Normal appearance.  HENT:     Head: Normocephalic and atraumatic.     Mouth/Throat:     Mouth: Mucous membranes are moist.  Eyes:     Extraocular Movements: Extraocular movements intact.     Conjunctiva/sclera: Conjunctivae normal.     Pupils: Pupils are equal, round, and reactive to light.     Comments: R+L lower eyelid ectropion, mild crusted eyelashes.   Cardiovascular:     Rate and Rhythm: Normal rate. Rhythm irregular.     Heart sounds: Murmur heard.  Pulmonary:     Effort: Pulmonary effort is normal.      Breath sounds: Rales present.     Comments: Bibasilar rales.  Abdominal:     General: Bowel sounds are normal.     Palpations: Abdomen is soft.     Tenderness: There is no abdominal tenderness.  Musculoskeletal:     Cervical back: Normal range of motion and neck supple.     Right lower leg: Edema present.     Left lower leg: Edema present.     Comments: 1-2 + edema BLE L>R, uses TED sometimes.  Skin:    General: Skin is warm and dry.  Neurological:     General: No focal deficit present.     Mental Status: He is alert and oriented to person, place, and time. Mental status is at baseline.     Motor: Weakness present.     Coordination: Coordination normal.     Gait: Gait abnormal.     Comments: Left sided weakness form previous CVA, muscle strength 5/5  Psychiatric:        Mood and Affect: Mood normal.        Behavior: Behavior normal.        Thought Content: Thought content normal.        Judgment: Judgment normal.    Labs reviewed: Recent Labs    09/07/22 0000  NA 140  K 4.2  CL 102  CO2 32*  BUN 28*  CREATININE 1.5*  CALCIUM 9.1   Recent Labs    09/07/22 0000  AST 17  ALT 13  ALBUMIN 3.4*   Recent Labs    09/07/22 0000  WBC 6.0  NEUTROABS 3,360.00  HGB 13.8  HCT 41  PLT 189   Lab Results  Component Value Date   TSH 2.91 09/07/2022   No results found for: "HGBA1C" Lab Results  Component Value Date   CHOL 183  05/05/2020   HDL 54 05/05/2020   LDLCALC 107 (H) 05/05/2020   TRIG 128 05/05/2020   CHOLHDL 3.4 05/05/2020    Significant Diagnostic Results in last 30 days:  No results found.  Assessment/Plan Paroxysmal atrial fibrillation  tachy brady, refused PPM, heart rate is in control. EKG 12/05/19 Afib at cardiology, only takes ASA  Thrombophilia (HCC)  had work up in the past, venous US negative DVT in legs.   Urinary frequency no difference since off Mirabegran due to cost, had urinary retention, s/p ED eval 11/22/20, f/u urology  CAD  (coronary artery disease) on ASA, Fish oil, Coronary artery bypass grafting 1992  History of GI bleed Hgb 14.2 12/02/22  CVA (cerebral vascular accident) (HCC) slightly left sided weakness with muscle strength 5/5  Hypertension Blood pressure is  controlled, on Torsemide.   Pulmonary hypertension, primary (HCC) edema BLE, takes Torsemide, [place] 6/18/21negative DVT, last echo 2018 EF 55-60%, CXR 12/02/22 no acute findings  Insomnia  takes Zolpidem  Hypothyroidism takes Levothyroxine, TSH 2.91 09/07/22  CKD (chronic kidney disease) stage 3, GFR 30-59 ml/min (HCC) Bun/creat 33/1.65 12/02/22  Hyperlipidemia LDL 107 05/05/20, on Omega 3 supplement     Family/ staff Communication: plan of care reviewed with the patient and charge nurse.   Labs/tests ordered: none  Time spend 35 minutes.

## 2022-12-17 NOTE — Assessment & Plan Note (Signed)
Bun/creat 33/1.65 12/02/22

## 2022-12-17 NOTE — Assessment & Plan Note (Signed)
Blood pressure is  controlled, on Torsemide. 

## 2022-12-17 NOTE — Assessment & Plan Note (Signed)
edema BLE, takes Torsemide, [place] 6/18/21negative DVT, last echo 2018 EF 55-60%, CXR 12/02/22 no acute findings

## 2022-12-17 NOTE — Assessment & Plan Note (Signed)
LDL 107 05/05/20, on Omega 3 supplement °

## 2022-12-17 NOTE — Assessment & Plan Note (Signed)
on ASA, Fish oil, Coronary artery bypass grafting 1992 

## 2022-12-17 NOTE — Assessment & Plan Note (Signed)
Stable, takes Colace.  

## 2022-12-17 NOTE — Assessment & Plan Note (Signed)
had work up in the past, venous US negative DVT in legs.  

## 2022-12-17 NOTE — Assessment & Plan Note (Signed)
Hgb 14.2 12/02/22

## 2022-12-17 NOTE — Assessment & Plan Note (Signed)
takes Levothyroxine, TSH 2.91 09/07/22 

## 2022-12-17 NOTE — Assessment & Plan Note (Signed)
slightly left sided weakness with muscle strength 5/5 

## 2022-12-27 ENCOUNTER — Other Ambulatory Visit: Payer: Self-pay | Admitting: Orthopedic Surgery

## 2022-12-27 DIAGNOSIS — G4700 Insomnia, unspecified: Secondary | ICD-10-CM

## 2022-12-27 MED ORDER — ZOLPIDEM TARTRATE 5 MG PO TABS: 5.0000 mg | ORAL_TABLET | Freq: Every day | ORAL | 0 refills | Status: DC

## 2022-12-31 ENCOUNTER — Encounter: Payer: Self-pay | Admitting: Nurse Practitioner

## 2022-12-31 ENCOUNTER — Non-Acute Institutional Stay (SKILLED_NURSING_FACILITY): Payer: Medicare PPO | Admitting: Nurse Practitioner

## 2022-12-31 DIAGNOSIS — T148XXA Other injury of unspecified body region, initial encounter: Secondary | ICD-10-CM | POA: Insufficient documentation

## 2022-12-31 DIAGNOSIS — E039 Hypothyroidism, unspecified: Secondary | ICD-10-CM

## 2022-12-31 DIAGNOSIS — G4700 Insomnia, unspecified: Secondary | ICD-10-CM

## 2022-12-31 DIAGNOSIS — N183 Chronic kidney disease, stage 3 unspecified: Secondary | ICD-10-CM

## 2022-12-31 DIAGNOSIS — I480 Paroxysmal atrial fibrillation: Secondary | ICD-10-CM | POA: Diagnosis not present

## 2022-12-31 DIAGNOSIS — I270 Primary pulmonary hypertension: Secondary | ICD-10-CM

## 2022-12-31 DIAGNOSIS — I2510 Atherosclerotic heart disease of native coronary artery without angina pectoris: Secondary | ICD-10-CM | POA: Diagnosis not present

## 2022-12-31 DIAGNOSIS — R350 Frequency of micturition: Secondary | ICD-10-CM

## 2022-12-31 NOTE — Assessment & Plan Note (Signed)
no difference since off Mirabegran due to cost, had urinary retention, s/p ED eval 11/22/20, f/u urology 

## 2022-12-31 NOTE — Assessment & Plan Note (Signed)
Bun/creat 33/1.65 12/02/22

## 2022-12-31 NOTE — Assessment & Plan Note (Signed)
Stable,  takes Zolpidem

## 2022-12-31 NOTE — Assessment & Plan Note (Signed)
right lower shin open area 2x3cm, serous drainage, no redness, warmth, or tenderness noted upon my examination today. Continue protective dressing daily.

## 2022-12-31 NOTE — Assessment & Plan Note (Signed)
takes Levothyroxine, TSH 2.91 09/07/22 

## 2022-12-31 NOTE — Progress Notes (Signed)
Location:   [facility] Room Number: [id number] Place of Service:  SNF (31) Provider: Arna Snipe Cristina Mattern NP  Edan Serratore X, NP  Patient Care Team: Kween Bacorn X, NP as PCP - General (Internal Medicine) Lyn Records, MD (Inactive) as PCP - Cardiology (Cardiology)  Extended Emergency Contact Information Primary Emergency Contact: Mumaw,Jean Address: [address]          [place], [place] Home Phone: [phone] Relation: Spouse Secondary Emergency Contact: Lorretta Harp Address: [address]          [place], [place] [address] [place] Home Phone: [phone] Relation: Daughter  Code Status: DNR Goals of care: Advanced Directive information    12/17/2022    2:32 PM  Advanced Directives  Does Patient Have a Medical Advance Directive? Yes  Type of Advance Directive Living will;Out of facility DNR (pink MOST or yellow form)  Does patient want to make changes to medical advance directive? No - Patient declined  Pre-existing out of facility DNR order (yellow form or pink MOST form) Pink MOST form placed in chart (order not valid for inpatient use);Yellow form placed in chart (order not valid for inpatient use)     Chief Complaint  Patient presents with   Acute Visit    Skin abrasion    HPI:  Pt is a 89 y.o. male seen today for an acute visit for right lower shin open area, serous drainage, no redness, warmth, or tenderness noted upon my examination today.               Chronic blepharitis/lower eyelids ectropion, mild crusted eyelashes, patient declined ophthalmic ointment.               Afib, tachy brady, refused PPM, heart rate is in control. EKG 12/05/19 Afib at cardiology, only takes ASA             Thrombophilia, had work up in the past, venous US negative DVT in legs.               Urinary frequency, no difference since off Mirabegran due to cost, had urinary retention, s/p ED eval 11/22/20, f/u urology             CAD, on ASA, Fish oil,  Coronary artery bypass grafting 1992             Hx of GI bleed, Hgb 14.2 12/02/22             CVA, slightly left sided weakness with muscle strength 5/5             HTN, controlled, on Torsemide.  Pulmonary hypertension/edema BLE, takes Torsemide, [place] 6/18/21negative DVT, last echo 2018 EF 55-60%, CXR 12/02/22 no acute findings.  Insomnia, takes Zolpidem             Hypothyroidism, takes Levothyroxine, TSH 2.91 09/07/22             CKD Bun/creat 33/1.65 12/02/22             Hyperlipidemia, LDL 107 05/05/20, on Omega 3 supplement             Constipation, takes Colace.               Past Medical History:  Diagnosis Date   Atrial fibrillation (HCC)    CAD (coronary artery disease)    Diverticulosis    Hypercholesteremia    Hypertension    Hypothyroidism    Insomnia  Murmur    Paroxysmal atrial fibrillation (HCC)    Urinary dysfunction    Past Surgical History:  Procedure Laterality Date   BYPASS GRAFT     HEMORRHOID SURGERY     HERNIA REPAIR     HIP SURGERY      Allergies  Allergen Reactions   Cranberry Extract Cough   Shellfish Allergy Swelling and Rash    Allergies as of 12/31/2022       Reactions   Cranberry Extract Cough   Shellfish Allergy Swelling, Rash        Medication List        Accurate as of December 31, 2022  1:43 PM. If you have any questions, ask your nurse or doctor.          aspirin EC 81 MG tablet in the morning and at bedtime.   cholecalciferol 1000 units tablet Commonly known as: VITAMIN D Take 1,000 Units by mouth daily.   docusate sodium 100 MG capsule Commonly known as: COLACE Take 200 mg by mouth at bedtime.   levothyroxine 50 MCG tablet Commonly known as: SYNTHROID Take 50 mcg by mouth daily before breakfast.   nystatin powder Commonly known as: MYCOSTATIN/NYSTOP Apply 1 Application topically 2 (two) times daily. Apply to (L) groin area topically two times a day related to RASH AND OTHER NONSPECIFIC SKIN ERUPTION   omega-3  acid ethyl esters 1 g capsule Commonly known as: LOVAZA Take 1 g by mouth 2 (two) times daily.   PEG 3350 17 GM/SCOOP Powd Give 17gm one time daily.   potassium chloride 10 MEQ CR capsule Commonly known as: MICRO-K Take 10 mEq by mouth daily.   PRESERVISION AREDS 2 PO Take by mouth daily.   Therems-M Tabs Take 1 tablet by mouth daily.   saccharomyces boulardii 250 MG capsule Commonly known as: FLORASTOR Take 250 mg by mouth daily.   Systane 0.4-0.3 % Soln Generic drug: Polyethyl Glycol-Propyl Glycol Place 2 drops into the left eye at bedtime.   torsemide 20 MG tablet Commonly known as: DEMADEX Take 20 mg by mouth daily.   vitamin C 1000 MG tablet Take 1,000 mg by mouth in the morning and at bedtime.   zolpidem 5 MG tablet Commonly known as: AMBIEN Take 1 tablet (5 mg total) by mouth at bedtime. TAKE ONE TABLET AT BEDTIME        Review of Systems  Constitutional:  Negative for appetite change, fatigue and fever.  HENT:  Positive for hearing loss and trouble swallowing. Negative for congestion, sinus pressure, sore throat and voice change.   Respiratory:  Positive for shortness of breath. Negative for cough and wheezing.        Occasional nocturnal orthopnea at his baseline.   Cardiovascular:  Positive for leg swelling. Negative for chest pain and palpitations.  Gastrointestinal:  Negative for abdominal pain and constipation.  Genitourinary:  Positive for frequency. Negative for difficulty urinating and urgency.  Musculoskeletal:  Positive for arthralgias and gait problem.  Skin:  Positive for wound. Negative for color change.  Neurological:  Negative for speech difficulty, weakness and headaches.  Psychiatric/Behavioral:  Negative for confusion and sleep disturbance. The patient is not nervous/anxious.     Immunization History  Administered Date(s) Administered   Influenza, High Dose Seasonal PF 04/11/2017, 03/26/2020   Influenza-Unspecified 03/26/2020,  04/01/2021, 04/12/2022   Moderna SARS-COV2 Booster Vaccination 04/06/2020   Moderna Sars-Covid-2 Vaccination 06/18/2019, 07/16/2019, 10/30/2021   PFIZER(Purple Top)SARS-COV-2 Vaccination 09/14/2020, 03/03/2021   Pfizer Covid-19 Vaccine  Bivalent Booster 67yrs & up 04/15/2022   Pneumococcal-Unspecified 08/12/2012   Tetanus 06/14/2004   Zoster Recombinant(Shingrix) 02/03/2017, 06/02/2017   Pertinent  Health Maintenance Due  Topic Date Due   INFLUENZA VACCINE  01/13/2023      06/22/2022   11:47 AM 07/20/2022   10:38 AM 09/07/2022   10:32 AM 10/18/2022   10:21 AM 12/17/2022    2:32 PM  Fall Risk  Falls in the past year? 0 0 1 1 1   Was there an injury with Fall? 0 0 0 0 1  Fall Risk Category Calculator 0 0 2 2 3   Fall Risk Category (Retired) Low      (RETIRED) Patient Fall Risk Level Low fall risk      Patient at Risk for Falls Due to History of fall(s) History of fall(s) History of fall(s);Impaired balance/gait;Impaired mobility History of fall(s);Impaired balance/gait History of fall(s);Impaired balance/gait;Impaired mobility  Fall risk Follow up Falls evaluation completed Falls evaluation completed Falls evaluation completed Falls evaluation completed Falls evaluation completed   Functional Status Survey:    Vitals:   12/31/22 1328  BP: 139/88  Pulse: 87  Resp: 18  Temp: (!) 97.3 F (36.3 C)  SpO2: 96%  Weight: 166 lb (75.3 kg)   Body mass index is 28.49 kg/m. Physical Exam Vitals and nursing note reviewed.  Constitutional:      Appearance: Normal appearance.  HENT:     Head: Normocephalic and atraumatic.     Mouth/Throat:     Mouth: Mucous membranes are moist.  Eyes:     Extraocular Movements: Extraocular movements intact.     Conjunctiva/sclera: Conjunctivae normal.     Pupils: Pupils are equal, round, and reactive to light.     Comments: R+L lower eyelid ectropion, mild crusted eyelashes.   Cardiovascular:     Rate and Rhythm: Normal rate. Rhythm irregular.     Heart  sounds: Murmur heard.  Pulmonary:     Effort: Pulmonary effort is normal.     Breath sounds: Rales present.     Comments: Bibasilar rales.  Abdominal:     General: Bowel sounds are normal.     Palpations: Abdomen is soft.     Tenderness: There is no abdominal tenderness.  Musculoskeletal:     Cervical back: Normal range of motion and neck supple.     Right lower leg: Edema present.     Left lower leg: Edema present.     Comments: 1-2 + edema BLE L>R, uses TED sometimes.  Skin:    General: Skin is warm and dry.     Comments: The right lower shin open area 2x3cm, serous drainage, no redness, warmth, or tenderness noted upon my examination today  Neurological:     General: No focal deficit present.     Mental Status: He is alert and oriented to person, place, and time. Mental status is at baseline.     Motor: Weakness present.     Coordination: Coordination normal.     Gait: Gait abnormal.     Comments: Left sided weakness form previous CVA, muscle strength 5/5  Psychiatric:        Mood and Affect: Mood normal.        Behavior: Behavior normal.        Thought Content: Thought content normal.        Judgment: Judgment normal.     Labs reviewed: Recent Labs    09/07/22 0000  NA 140  K 4.2  CL 102  CO2 32*  BUN 28*  CREATININE 1.5*  CALCIUM 9.1   Recent Labs    09/07/22 0000  AST 17  ALT 13  ALBUMIN 3.4*   Recent Labs    09/07/22 0000  WBC 6.0  NEUTROABS 3,360.00  HGB 13.8  HCT 41  PLT 189   Lab Results  Component Value Date   TSH 2.91 09/07/2022   No results found for: "HGBA1C" Lab Results  Component Value Date   CHOL 183 05/05/2020   HDL 54 05/05/2020   LDLCALC 107 (H) 05/05/2020   TRIG 128 05/05/2020   CHOLHDL 3.4 05/05/2020    Significant Diagnostic Results in last 30 days:  No results found.  Assessment/Plan: Paroxysmal atrial fibrillation tachy brady, refused PPM, heart rate is in control. EKG 12/05/19 Afib at cardiology, only takes  ASA  Urinary frequency  no difference since off Mirabegran due to cost, had urinary retention, s/p ED eval 11/22/20, f/u urology  CAD (coronary artery disease) on ASA, Fish oil, Coronary artery bypass grafting 1992  Pulmonary hypertension, primary (HCC) Pulmonary hypertension/edema BLE, takes Torsemide, [place] 6/18/21negative DVT, last echo 2018 EF 55-60%, CXR 12/02/22 no acute findings.   Insomnia Stable, takes Zolpidem  Hypothyroidism  takes Levothyroxine, TSH 2.91 09/07/22  CKD (chronic kidney disease) stage 3, GFR 30-59 ml/min (HCC) Bun/creat 33/1.65 12/02/22  Skin abrasion  right lower shin open area 2x3cm, serous drainage, no redness, warmth, or tenderness noted upon my examination today. Continue protective dressing daily.     Family/ staff Communication: plan of care reviewed with the patient, the patient' daughter, and charge nurse.   Labs/tests ordered:  none  Time spend 35 minutes.

## 2022-12-31 NOTE — Assessment & Plan Note (Signed)
on ASA, Fish oil, Coronary artery bypass grafting 1992 

## 2022-12-31 NOTE — Assessment & Plan Note (Signed)
Pulmonary hypertension/edema BLE, takes Torsemide, [place] 6/18/21negative DVT, last echo 2018 EF 55-60%, CXR 12/02/22 no acute findings.

## 2022-12-31 NOTE — Assessment & Plan Note (Signed)
tachy brady, refused PPM, heart rate is in control. EKG 12/05/19 Afib at cardiology, only takes ASA 

## 2023-01-11 ENCOUNTER — Non-Acute Institutional Stay (SKILLED_NURSING_FACILITY): Payer: Medicare PPO | Admitting: Nurse Practitioner

## 2023-01-11 ENCOUNTER — Encounter: Payer: Self-pay | Admitting: Nurse Practitioner

## 2023-01-11 DIAGNOSIS — I480 Paroxysmal atrial fibrillation: Secondary | ICD-10-CM

## 2023-01-11 DIAGNOSIS — I10 Essential (primary) hypertension: Secondary | ICD-10-CM

## 2023-01-11 DIAGNOSIS — Z8719 Personal history of other diseases of the digestive system: Secondary | ICD-10-CM

## 2023-01-11 DIAGNOSIS — E039 Hypothyroidism, unspecified: Secondary | ICD-10-CM

## 2023-01-11 DIAGNOSIS — R350 Frequency of micturition: Secondary | ICD-10-CM

## 2023-01-11 DIAGNOSIS — E785 Hyperlipidemia, unspecified: Secondary | ICD-10-CM

## 2023-01-11 DIAGNOSIS — D6859 Other primary thrombophilia: Secondary | ICD-10-CM

## 2023-01-11 DIAGNOSIS — I2510 Atherosclerotic heart disease of native coronary artery without angina pectoris: Secondary | ICD-10-CM

## 2023-01-11 DIAGNOSIS — G4700 Insomnia, unspecified: Secondary | ICD-10-CM

## 2023-01-11 DIAGNOSIS — R609 Edema, unspecified: Secondary | ICD-10-CM

## 2023-01-11 DIAGNOSIS — I6300 Cerebral infarction due to thrombosis of unspecified precerebral artery: Secondary | ICD-10-CM

## 2023-01-11 DIAGNOSIS — N183 Chronic kidney disease, stage 3 unspecified: Secondary | ICD-10-CM

## 2023-01-11 NOTE — Assessment & Plan Note (Signed)
had work up in the past, venous US negative DVT in legs.  

## 2023-01-11 NOTE — Assessment & Plan Note (Signed)
LDL 107 05/05/20, on Omega 3 supplement °

## 2023-01-11 NOTE — Assessment & Plan Note (Signed)
tachy brady, refused PPM. EKG 12/05/19 Afib at cardiology, only takes ASA

## 2023-01-11 NOTE — Assessment & Plan Note (Signed)
no difference since off Mirabegran due to cost, had urinary retention, s/p ED eval 11/22/20, f/u urology 

## 2023-01-11 NOTE — Assessment & Plan Note (Signed)
slightly left sided weakness with muscle strength 5/5 

## 2023-01-11 NOTE — Assessment & Plan Note (Signed)
Hgb 14.2 12/02/22

## 2023-01-11 NOTE — Assessment & Plan Note (Signed)
takes Zolpidem.  ?

## 2023-01-11 NOTE — Assessment & Plan Note (Signed)
on ASA, Fish oil, Coronary artery bypass grafting 1992 

## 2023-01-11 NOTE — Assessment & Plan Note (Signed)
Blood pressure is controlled, controlled, on Torsemide.  

## 2023-01-11 NOTE — Assessment & Plan Note (Signed)
Bun/creat 33/1.65 12/02/22

## 2023-01-11 NOTE — Assessment & Plan Note (Signed)
takes Levothyroxine, TSH 2.91 09/07/22 

## 2023-01-11 NOTE — Assessment & Plan Note (Signed)
takes Torsemide, [place] 6/18/21negative DVT, last echo 2018 EF 55-60%, CXR 12/02/22 no acute findings.

## 2023-01-11 NOTE — Progress Notes (Signed)
Location:  [facility] Room Number: 75 Place of Service:  SNF (31) Provider:  ,  X, NP  Patient Care Team: ,  X, NP as PCP - General (Internal Medicine) Lyn Records, MD (Inactive) as PCP - Cardiology (Cardiology)  Extended Emergency Contact Information Primary Emergency Contact: Jeziorski,Jean Address: [address]          [name], [place] Home Phone: [phone] Relation: Spouse Secondary Emergency Contact: Lorretta Harp Address: [address]          [place], [place] [address] [place] Home Phone: [phone] Relation: Daughter  Code Status:  DNR Goals of care: Advanced Directive information    01/11/2023   11:51 AM  Advanced Directives  Does Patient Have a Medical Advance Directive? Yes  Type of Advance Directive Living will;Out of facility DNR (pink MOST or yellow form)  Does patient want to make changes to medical advance directive? No - Patient declined  Pre-existing out of facility DNR order (yellow form or pink MOST form) Pink MOST form placed in chart (order not valid for inpatient use);Yellow form placed in chart (order not valid for inpatient use)     Chief Complaint  Patient presents with  . Medical agement of Chronic Issues    Routine Visit  . Quality Metric Gaps    Needs to discuss Covid vaccine    HPI:  Pt is a 89 y.o. male seen today for medical management of chronic diseases.     Chronic blepharitis/lower eyelids ectropion, mild crusted eyelashes, patient declined ophthalmic ointment.               Afib, tachy brady, refused PPM. EKG 12/05/19 Afib at cardiology, only takes ASA             Thrombophilia, had work up in the past, venous US negative DVT in legs.               Urinary frequency, no difference since off Mirabegran due to cost, had urinary retention, s/p ED eval 11/22/20, f/u urology             CAD, on ASA, Fish oil, Coronary artery bypass grafting 1992              Hx of GI bleed, Hgb 14.2 12/02/22             CVA, slightly left sided weakness with muscle strength 5/5             HTN, controlled, on Torsemide.  Pulmonary hypertension/edema BLE, takes Torsemide, [place] 6/18/21negative DVT, last echo 2018 EF 55-60%, CXR 12/02/22 no acute findings.  Insomnia, takes Zolpidem             Hypothyroidism, takes Levothyroxine, TSH 2.91 09/07/22             CKD Bun/creat 33/1.65 12/02/22             Hyperlipidemia, LDL 107 05/05/20, on Omega 3 supplement             Constipation, takes Colace.                  Past Medical History:  Diagnosis Date  . Atrial fibrillation (HCC)   . CAD (coronary artery disease)   . Diverticulosis   . Hypercholesteremia   . Hypertension   . Hypothyroidism   . Insomnia   . Murmur   . Paroxysmal atrial fibrillation (HCC)   . Urinary  dysfunction    Past Surgical History:  Procedure Laterality Date  . BYPASS GRAFT    . HEMORRHOID SURGERY    . HERNIA REPAIR    . HIP SURGERY      Allergies  Allergen Reactions  . Cranberry Extract Cough  . Shellfish Allergy Swelling and Rash    Outpatient Encounter Medications as of 01/11/2023  Medication Sig  . Ascorbic Acid (VITAMIN C) 1000 MG tablet Take 1,000 mg by mouth in the morning and at bedtime.  Marland Kitchen aspirin 81 MG EC tablet in the morning and at bedtime.   . cholecalciferol (VITAMIN D) 1000 UNITS tablet Take 1,000 Units by mouth daily.  Marland Kitchen docusate sodium (COLACE) 100 MG capsule Take 200 mg by mouth at bedtime.  Marland Kitchen levothyroxine (SYNTHROID) 50 MCG tablet Take 50 mcg by mouth daily before breakfast.  . Multiple Vitamins-Minerals (PRESERVISION AREDS 2 PO) Take by mouth daily.  . Multiple Vitamins-Minerals (THEREMS-M) TABS Take 1 tablet by mouth daily.  Marland Kitchen nystatin (MYCOSTATIN/NYSTOP) powder Apply 1 Application topically 2 (two) times daily. Apply to (L) groin area topically two times a day related to RASH AND OTHER NONSPECIFIC SKIN ERUPTION  . omega-3 acid ethyl esters (LOVAZA) 1 g  capsule Take 1 g by mouth 2 (two) times daily.  Bertram Gala Glycol-Propyl Glycol (SYSTANE) 0.4-0.3 % SOLN Place 2 drops into the left eye at bedtime.  . Polyethylene Glycol 3350 (PEG 3350) 17 GM/SCOOP POWD Give 17gm one time daily.  . potassium chloride (MICRO-K) 10 MEQ CR capsule Take 10 mEq by mouth daily.  Marland Kitchen saccharomyces boulardii (FLORASTOR) 250 MG capsule Take 250 mg by mouth daily.  Marland Kitchen torsemide (DEMADEX) 20 MG tablet Take 20 mg by mouth daily.  Marland Kitchen zolpidem (AMBIEN) 5 MG tablet Take 1 tablet (5 mg total) by mouth at bedtime. TAKE ONE TABLET AT BEDTIME   No facility-administered encounter medications on file as of 01/11/2023.    Review of Systems  Constitutional:  Negative for appetite change, fatigue and fever.  HENT:  Positive for hearing loss and trouble swallowing. Negative for congestion, sinus pressure, sore throat and voice change.   Respiratory:  Positive for shortness of breath. Negative for cough and wheezing.        Occasional nocturnal orthopnea at his baseline.   Cardiovascular:  Positive for leg swelling. Negative for chest pain and palpitations.  Gastrointestinal:  Negative for abdominal pain and constipation.  Genitourinary:  Positive for frequency. Negative for difficulty urinating and urgency.  Musculoskeletal:  Positive for arthralgias and gait problem.  Skin:  Negative for color change.  Neurological:  Negative for speech difficulty, weakness and headaches.  Psychiatric/Behavioral:  Negative for confusion and sleep disturbance. The patient is not nervous/anxious.     Immunization History  Administered Date(s) Administered  . Influenza, High Dose Seasonal PF 04/11/2017, 03/26/2020  . Influenza-Unspecified 03/26/2020, 04/01/2021, 04/12/2022  . Moderna SARS-COV2 Booster Vaccination 04/06/2020  . Moderna Sars-Covid-2 Vaccination 06/18/2019, 07/16/2019, 10/30/2021  . PFIZER(Purple Top)SARS-COV-2 Vaccination 09/14/2020, 03/03/2021  . Furniture conservator/restorer 73yrs & up 04/15/2022  . Pneumococcal-Unspecified 08/12/2012  . Tetanus 06/14/2004  . Zoster Recombinant(Shingrix) 02/03/2017, 06/02/2017   Pertinent  Health Maintenance Due  Topic Date Due  . INFLUENZA VACCINE  01/13/2023      06/22/2022   11:47 AM 07/20/2022   10:38 AM 09/07/2022   10:32 AM 10/18/2022   10:21 AM 12/17/2022    2:32 PM  Fall Risk  Falls in the past year? 0 0 1 1 1  Was there an injury with Fall? 0 0 0 0 1  Fall Risk Category Calculator 0 0 2 2 3   Fall Risk Category (Retired) Low      (RETIRED) Patient Fall Risk Level Low fall risk      Patient at Risk for Falls Due to History of fall(s) History of fall(s) History of fall(s);Impaired balance/gait;Impaired mobility History of fall(s);Impaired balance/gait History of fall(s);Impaired balance/gait;Impaired mobility  Fall risk Follow up Falls evaluation completed Falls evaluation completed Falls evaluation completed Falls evaluation completed Falls evaluation completed   Functional Status Survey:    Vitals:   01/11/23 1145  BP: 114/74  Pulse: (!) 45  Resp: 18  Temp: 98 F (36.7 C)  SpO2: 94%  Weight: 164 lb (74.4 kg)  Height: 5\' 4"  (1.626 m)   Body mass index is 28.15 kg/m. Physical Exam Vitals and nursing note reviewed.  Constitutional:      Appearance: Normal appearance.  HENT:     Head: Normocephalic and atraumatic.     Mouth/Throat:     Mouth: Mucous membranes are moist.  Eyes:     Extraocular Movements: Extraocular movements intact.     Conjunctiva/sclera: Conjunctivae normal.     Pupils: Pupils are equal, round, and reactive to light.     Comments: R+L lower eyelid ectropion, mild crusted eyelashes.   Cardiovascular:     Rate and Rhythm: Bradycardia present. Rhythm irregular.     Heart sounds: Murmur heard.  Pulmonary:     Effort: Pulmonary effort is normal.     Breath sounds: Rales present.     Comments: Bibasilar rales.  Abdominal:     General: Bowel sounds are normal.     Palpations:  Abdomen is soft.     Tenderness: There is no abdominal tenderness.  Musculoskeletal:     Cervical back: Normal range of motion and neck supple.     Right lower leg: Edema present.     Left lower leg: Edema present.     Comments: 1-2 + edema BLE L>R, uses TED sometimes.  Skin:    General: Skin is warm and dry.     Comments: The right lower shin open area 2x3cm healing, no s/s of infection  Neurological:     General: No focal deficit present.     Mental Status: He is alert and oriented to person, place, and time. Mental status is at baseline.     Motor: Weakness present.     Coordination: Coordination normal.     Gait: Gait abnormal.     Comments: Left sided weakness form previous CVA, muscle strength 5/5  Psychiatric:        Mood and Affect: Mood normal.        Behavior: Behavior normal.        Thought Content: Thought content normal.        Judgment: Judgment normal.    Labs reviewed: Recent Labs    09/07/22 0000 12/03/22 0000  NA 140 139  K 4.2 3.7  CL 102 102  CO2 32* 28*  BUN 28* 33*  CREATININE 1.5* 1.7*  CALCIUM 9.1 8.8   Recent Labs    09/07/22 0000 12/03/22 0000  AST 17 18  ALT 13 11  ALKPHOS  --  65  ALBUMIN 3.4* 3.3*   Recent Labs    09/07/22 0000 12/02/22 0000  WBC 6.0 8.0  NEUTROABS 3,360.00 6,280.00  HGB 13.8 14.2  HCT 41 42  PLT 189 212   Lab Results  Component Value Date   TSH 2.91 09/07/2022   No results found for: "HGBA1C" Lab Results  Component Value Date   CHOL 183 05/05/2020   HDL 54 05/05/2020   LDLCALC 107 (H) 05/05/2020   TRIG 128 05/05/2020   CHOLHDL 3.4 05/05/2020    Significant Diagnostic Results in last 30 days:  No results found.  Assessment/Plan Paroxysmal atrial fibrillation tachy brady, refused PPM. EKG 12/05/19 Afib at cardiology, only takes ASA  Thrombophilia (HCC)  had work up in the past, venous US negative DVT in legs.   Urinary frequency no difference since off Mirabegran due to cost, had urinary  retention, s/p ED eval 11/22/20, f/u urology  CAD (coronary artery disease) on ASA, Fish oil, Coronary artery bypass grafting 1992  History of GI bleed Hgb 14.2 12/02/22  CVA (cerebral vascular accident) (HCC) slightly left sided weakness with muscle strength 5/5  Hypertension Blood pressure is controlled, controlled, on Torsemide.   Edema  takes Torsemide, [place] 6/18/21negative DVT, last echo 2018 EF 55-60%, CXR 12/02/22 no acute findings.   Insomnia takes Zolpidem  Hypothyroidism  takes Levothyroxine, TSH 2.91 09/07/22  CKD (chronic kidney disease) stage 3, GFR 30-59 ml/min (HCC)  Bun/creat 33/1.65 12/02/22  Hyperlipidemia LDL 107 05/05/20, on Omega 3 supplement     Family/ staff Communication: plan of care reviewed with the patient and charge nurse.   Labs/tests ordered:  none  Time spend 35 minutes.

## 2023-01-21 ENCOUNTER — Encounter: Payer: Self-pay | Admitting: Family Medicine

## 2023-01-24 ENCOUNTER — Encounter: Payer: Self-pay | Admitting: Family Medicine

## 2023-01-27 ENCOUNTER — Non-Acute Institutional Stay (SKILLED_NURSING_FACILITY): Payer: Medicare PPO | Admitting: Nurse Practitioner

## 2023-01-27 ENCOUNTER — Encounter: Payer: Self-pay | Admitting: Nurse Practitioner

## 2023-01-27 DIAGNOSIS — I2510 Atherosclerotic heart disease of native coronary artery without angina pectoris: Secondary | ICD-10-CM | POA: Diagnosis not present

## 2023-01-27 DIAGNOSIS — I6300 Cerebral infarction due to thrombosis of unspecified precerebral artery: Secondary | ICD-10-CM

## 2023-01-27 DIAGNOSIS — I480 Paroxysmal atrial fibrillation: Secondary | ICD-10-CM | POA: Diagnosis not present

## 2023-01-27 DIAGNOSIS — W19XXXD Unspecified fall, subsequent encounter: Secondary | ICD-10-CM

## 2023-01-27 DIAGNOSIS — E039 Hypothyroidism, unspecified: Secondary | ICD-10-CM

## 2023-01-27 DIAGNOSIS — G4700 Insomnia, unspecified: Secondary | ICD-10-CM

## 2023-01-27 DIAGNOSIS — R350 Frequency of micturition: Secondary | ICD-10-CM | POA: Diagnosis not present

## 2023-01-27 DIAGNOSIS — I10 Essential (primary) hypertension: Secondary | ICD-10-CM

## 2023-01-27 NOTE — Assessment & Plan Note (Signed)
slightly left sided weakness with muscle strength 5/5 

## 2023-01-27 NOTE — Assessment & Plan Note (Signed)
fall when the patient was found sitting between wall and toilet, the patient stated he was trying to get into chair and it moved, then fell on the floor, no apparent injury. Frailty and poor safety awareness are contributory, close supervision and assistance for safety.

## 2023-01-27 NOTE — Assessment & Plan Note (Signed)
Blood pressure is  controlled, on Torsemide. 

## 2023-01-27 NOTE — Assessment & Plan Note (Signed)
Bun/creat 33/1.65 12/02/22

## 2023-01-27 NOTE — Assessment & Plan Note (Signed)
tachy brady, refused PPM. EKG 12/05/19 Afib at cardiology, only takes ASA, HR 59bpm today.

## 2023-01-27 NOTE — Assessment & Plan Note (Signed)
no difference since off Mirabegran due to cost, had urinary retention, s/p ED eval 11/22/20, f/u urology 

## 2023-01-27 NOTE — Progress Notes (Signed)
Location:   [facility] Room Number: 64A Place of Service:  SNF (31) Provider: Arna Snipe  NP  ,  X, NP  Patient Care Team: ,  X, NP as PCP - General (Internal Medicine) Lyn Records, MD (Inactive) as PCP - Cardiology (Cardiology)  Extended Emergency Contact Information Primary Emergency Contact: Kelson,Jean Address: [address]          [place], [place] Home Phone: [phone] Relation: Spouse Secondary Emergency Contact: Lorretta Harp Address: [address]          [place], [place] [address] [place] Home Phone: [phone] Relation: Daughter  Code Status: DNR Goals of care: Advanced Directive information    01/27/2023    3:11 PM  Advanced Directives  Does Patient Have a Medical Advance Directive? Yes  Type of Advance Directive Living will;Out of facility DNR (pink MOST or yellow form)  Does patient want to make changes to medical advance directive? No - Patient declined  Pre-existing out of facility DNR order (yellow form or pink MOST form) Pink MOST form placed in chart (order not valid for inpatient use);Yellow form placed in chart (order not valid for inpatient use)     Chief Complaint  Patient presents with   Acute Visit    Patient is being seen for a fall    Immunizations    Discuss the need for covid and flu vaccine     HPI:  Pt is a 89 y.o. male seen today for an acute visit for reported fall when the patient was found sitting between wall and toilet, the patient stated he was trying to get into chair and it moved, then fell on the floor, no apparent injury.   Chronic blepharitis/lower eyelids ectropion, mild crusted eyelashes, patient declined ophthalmic ointment.               Afib, tachy brady, refused PPM. EKG 12/05/19 Afib at cardiology, only takes ASA             Thrombophilia, had work up in the past, venous US negative DVT in legs.               Urinary frequency, no difference since  off Mirabegran due to cost, had urinary retention, s/p ED eval 11/22/20, f/u urology             CAD, on ASA, Fish oil, Coronary artery bypass grafting 1992             Hx of GI bleed, Hgb 14.2 12/02/22             CVA, slightly left sided weakness with muscle strength 5/5             HTN, controlled, on Torsemide.  Pulmonary hypertension/edema BLE, takes Torsemide, [place] 6/18/21negative DVT, last echo 2018 EF 55-60%, CXR 12/02/22 no acute findings.  Insomnia, takes Zolpidem             Hypothyroidism, takes Levothyroxine, TSH 2.91 09/07/22             CKD Bun/creat 33/1.65 12/02/22             Hyperlipidemia, LDL 107 05/05/20, on Omega 3 supplement             Constipation, takes Colace.                 Past Medical History:  Diagnosis Date   Atrial fibrillation [facility])    CAD (coronary  artery disease)    Diverticulosis    Hypercholesteremia    Hypertension    Hypothyroidism    Insomnia    Murmur    Paroxysmal atrial fibrillation (HCC)    Urinary dysfunction    Past Surgical History:  Procedure Laterality Date   BYPASS GRAFT     HEMORRHOID SURGERY     HERNIA REPAIR     HIP SURGERY      Allergies  Allergen Reactions   Cranberry Extract Cough   Shellfish Allergy Swelling and Rash    Allergies as of 01/27/2023       Reactions   Cranberry Extract Cough   Shellfish Allergy Swelling, Rash        Medication List        Accurate as of January 27, 2023  4:13 PM. If you have any questions, ask your nurse or doctor.          aspirin EC 81 MG tablet in the morning and at bedtime.   cholecalciferol 1000 units tablet Commonly known as: VITAMIN D Take 1,000 Units by mouth daily.   docusate sodium 100 MG capsule Commonly known as: COLACE Take 200 mg by mouth at bedtime.   levothyroxine 50 MCG tablet Commonly known as: SYNTHROID Take 50 mcg by mouth daily before breakfast.   nystatin powder Commonly known as: MYCOSTATIN/NYSTOP Apply 1 Application topically 2 (two)  times daily. Apply to (L) groin area topically two times a day related to RASH AND OTHER NONSPECIFIC SKIN ERUPTION   omega-3 acid ethyl esters 1 g capsule Commonly known as: LOVAZA Take 1 g by mouth 2 (two) times daily.   PEG 3350 17 GM/SCOOP Powd Give 17gm one time daily.   potassium chloride 10 MEQ CR capsule Commonly known as: MICRO-K Take 10 mEq by mouth daily.   PRESERVISION AREDS 2 PO Take by mouth daily.   Therems-M Tabs Take 1 tablet by mouth daily.   saccharomyces boulardii 250 MG capsule Commonly known as: FLORASTOR Take 250 mg by mouth daily.   Systane 0.4-0.3 % Soln Generic drug: Polyethyl Glycol-Propyl Glycol Place 2 drops into the left eye at bedtime.   torsemide 20 MG tablet Commonly known as: DEMADEX Take 20 mg by mouth daily.   vitamin C 1000 MG tablet Take 1,000 mg by mouth in the morning and at bedtime.   zolpidem 5 MG tablet Commonly known as: AMBIEN Take 1 tablet (5 mg total) by mouth at bedtime. TAKE ONE TABLET AT BEDTIME        Review of Systems  Constitutional:  Negative for appetite change, fatigue and fever.       Weight is a few Ibs less over the past year  HENT:  Positive for hearing loss and trouble swallowing. Negative for congestion, sinus pressure, sore throat and voice change.   Respiratory:  Positive for shortness of breath. Negative for cough and wheezing.        Occasional nocturnal orthopnea at his baseline.   Cardiovascular:  Positive for leg swelling. Negative for chest pain and palpitations.  Gastrointestinal:  Negative for abdominal pain and constipation.  Genitourinary:  Positive for frequency. Negative for difficulty urinating and urgency.  Musculoskeletal:  Positive for arthralgias and gait problem.  Skin:  Negative for color change.  Neurological:  Negative for speech difficulty, weakness and headaches.  Psychiatric/Behavioral:  Negative for confusion and sleep disturbance. The patient is not nervous/anxious.      Immunization History  Administered Date(s) Administered   Influenza, High  Dose Seasonal PF 04/11/2017, 03/26/2020   Influenza-Unspecified 03/26/2020, 04/01/2021, 04/12/2022   Moderna SARS-COV2 Booster Vaccination 04/06/2020   Moderna Sars-Covid-2 Vaccination 06/18/2019, 07/16/2019, 10/30/2021   PFIZER(Purple Top)SARS-COV-2 Vaccination 09/14/2020, 03/03/2021   Pfizer Covid-19 Vaccine Bivalent Booster 88yrs & up 04/15/2022   Pneumococcal-Unspecified 08/12/2012   Tetanus 06/14/2004   Zoster Recombinant(Shingrix) 02/03/2017, 06/02/2017   Pertinent  Health Maintenance Due  Topic Date Due   INFLUENZA VACCINE  01/13/2023      06/22/2022   11:47 AM 07/20/2022   10:38 AM 09/07/2022   10:32 AM 10/18/2022   10:21 AM 12/17/2022    2:32 PM  Fall Risk  Falls in the past year? 0 0 1 1 1   Was there an injury with Fall? 0 0 0 0 1  Fall Risk Category Calculator 0 0 2 2 3   Fall Risk Category (Retired) Low      (RETIRED) Patient Fall Risk Level Low fall risk      Patient at Risk for Falls Due to History of fall(s) History of fall(s) History of fall(s);Impaired balance/gait;Impaired mobility History of fall(s);Impaired balance/gait History of fall(s);Impaired balance/gait;Impaired mobility  Fall risk Follow up Falls evaluation completed Falls evaluation completed Falls evaluation completed Falls evaluation completed Falls evaluation completed   Functional Status Survey:    Vitals:   01/27/23 1508  BP: 134/71  Pulse: (!) 52  Resp: 16  Temp: (!) 97.5 F (36.4 C)  TempSrc: Temporal  SpO2: 96%  Weight: 161 lb 8 oz (73.3 kg)  Height: 5\' 4"  (1.626 m)   Body mass index is 27.72 kg/m. Physical Exam Vitals and nursing note reviewed.  Constitutional:      Appearance: Normal appearance.  HENT:     Head: Normocephalic and atraumatic.     Mouth/Throat:     Mouth: Mucous membranes are moist.  Eyes:     Extraocular Movements: Extraocular movements intact.     Conjunctiva/sclera: Conjunctivae  normal.     Pupils: Pupils are equal, round, and reactive to light.     Comments: R+L lower eyelid ectropion, mild crusted eyelashes.   Cardiovascular:     Rate and Rhythm: Bradycardia present. Rhythm irregular.     Heart sounds: Murmur heard.  Pulmonary:     Effort: Pulmonary effort is normal.     Breath sounds: Rales present.     Comments: Bibasilar rales.  Abdominal:     General: Bowel sounds are normal.     Palpations: Abdomen is soft.     Tenderness: There is abdominal tenderness.  Musculoskeletal:     Cervical back: Normal range of motion and neck supple.     Right lower leg: Edema present.     Left lower leg: Edema present.     Comments: 1-2 + edema BLE L>R, uses TED sometimes.  Skin:    General: Skin is warm and dry.     Comments: The right lower shin open area 2x3cm healing, no s/s of infection  Neurological:     General: No focal deficit present.     Mental Status: He is alert and oriented to person, place, and time. Mental status is at baseline.     Motor: Weakness present.     Coordination: Coordination normal.     Gait: Gait abnormal.     Comments: Left sided weakness form previous CVA, muscle strength 5/5  Psychiatric:        Mood and Affect: Mood normal.        Behavior: Behavior normal.  Thought Content: Thought content normal.        Judgment: Judgment normal.     Labs reviewed: Recent Labs    09/07/22 0000 12/03/22 0000  NA 140 139  K 4.2 3.7  CL 102 102  CO2 32* 28*  BUN 28* 33*  CREATININE 1.5* 1.7*  CALCIUM 9.1 8.8   Recent Labs    09/07/22 0000 12/03/22 0000  AST 17 18  ALT 13 11  ALKPHOS  --  65  ALBUMIN 3.4* 3.3*   Recent Labs    09/07/22 0000 12/02/22 0000  WBC 6.0 8.0  NEUTROABS 3,360.00 6,280.00  HGB 13.8 14.2  HCT 41 42  PLT 189 212   Lab Results  Component Value Date   TSH 2.91 09/07/2022   No results found for: "HGBA1C" Lab Results  Component Value Date   CHOL 183 05/05/2020   HDL 54 05/05/2020   LDLCALC  107 (H) 05/05/2020   TRIG 128 05/05/2020   CHOLHDL 3.4 05/05/2020    Significant Diagnostic Results in last 30 days:  No results found.  Assessment/Plan: Fall fall when the patient was found sitting between wall and toilet, the patient stated he was trying to get into chair and it moved, then fell on the floor, no apparent injury. Frailty and poor safety awareness are contributory, close supervision and assistance for safety.   Paroxysmal atrial fibrillation tachy brady, refused PPM. EKG 12/05/19 Afib at cardiology, only takes ASA, HR 59bpm today.   Urinary frequency no difference since off Mirabegran due to cost, had urinary retention, s/p ED eval 11/22/20, f/u urology  CAD (coronary artery disease) on ASA, Fish oil, Coronary artery bypass grafting 1992  CVA (cerebral vascular accident) (HCC) slightly left sided weakness with muscle strength 5/5  Hypertension Blood pressure is  controlled, on Torsemide.   Insomnia Chronic, takes Zolpidem  Hypothyroidism  takes Levothyroxine, TSH 2.91 09/07/22  CKD (chronic kidney disease) stage 3, GFR 30-59 ml/min (HCC) Bun/creat 33/1.65 12/02/22    Family/ staff Communication: plan of care reviewed with the patient and charge nurse.   Labs/tests ordered:  none  Time spend 35 minutes

## 2023-01-27 NOTE — Assessment & Plan Note (Signed)
Chronic, takes Zolpidem

## 2023-01-27 NOTE — Assessment & Plan Note (Signed)
on ASA, Fish oil, Coronary artery bypass grafting 1992 

## 2023-01-27 NOTE — Assessment & Plan Note (Signed)
takes Levothyroxine, TSH 2.91 09/07/22 

## 2023-03-03 ENCOUNTER — Encounter: Payer: Self-pay | Admitting: Sports Medicine

## 2023-03-03 ENCOUNTER — Non-Acute Institutional Stay (SKILLED_NURSING_FACILITY): Payer: Medicare PPO | Admitting: Sports Medicine

## 2023-03-03 DIAGNOSIS — R609 Edema, unspecified: Secondary | ICD-10-CM

## 2023-03-03 DIAGNOSIS — I2510 Atherosclerotic heart disease of native coronary artery without angina pectoris: Secondary | ICD-10-CM

## 2023-03-03 DIAGNOSIS — I480 Paroxysmal atrial fibrillation: Secondary | ICD-10-CM | POA: Diagnosis not present

## 2023-03-03 DIAGNOSIS — N183 Chronic kidney disease, stage 3 unspecified: Secondary | ICD-10-CM

## 2023-03-03 DIAGNOSIS — T148XXA Other injury of unspecified body region, initial encounter: Secondary | ICD-10-CM

## 2023-03-03 DIAGNOSIS — I6300 Cerebral infarction due to thrombosis of unspecified precerebral artery: Secondary | ICD-10-CM | POA: Diagnosis not present

## 2023-03-03 DIAGNOSIS — E039 Hypothyroidism, unspecified: Secondary | ICD-10-CM | POA: Diagnosis not present

## 2023-03-03 DIAGNOSIS — G4700 Insomnia, unspecified: Secondary | ICD-10-CM

## 2023-03-03 NOTE — Progress Notes (Signed)
Location:  [facility] Room Number: 64A Place of Service:  SNF (31) Provider:  [place]  Mast, Man X, NP  Patient Care Team: Mast, Man X, NP as PCP - General (Internal Medicine) Lyn Records, MD (Inactive) as PCP - Cardiology (Cardiology)  Extended Emergency Contact Information Primary Emergency Contact: Gottwald,Jean Address: [address]          [place], [place] Home Phone: [phone] Relation: Spouse Secondary Emergency Contact: Lorretta Harp Address: [address]          [place], [place] [address] [place] Home Phone: [phone] Relation: Daughter  Code Status:  DNR Goals of care: Advanced Directive information    03/03/2023   12:23 PM  Advanced Directives  Does Patient Have a Medical Advance Directive? Yes  Type of Advance Directive Living will;Out of facility DNR (pink MOST or yellow form)  Does patient want to make changes to medical advance directive? No - Patient declined  Pre-existing out of facility DNR order (yellow form or pink MOST form) Pink MOST form placed in chart (order not valid for inpatient use);Yellow form placed in chart (order not valid for inpatient use)     Chief Complaint  Patient presents with   Medical Management of Chronic Issues    Patient is being seen for routine visit   Immunizations    Patient is due for flu and covid vaccine     HPI:  Pt is a 89 y.o. male seen today for medical management of chronic diseases.   Pt seen and examined in his room , daughter is requesting him to be seen by dermatology for skin lesion on his scalp  No agitation reported by nursing staff He needs some help with transferring  and does not like to participate in group activities Sleeping fine Reports good appetite  He has a small superficial open wound on his rt lower leg  No exudates  Has chronic lower extremity swelling but no recent change    Past Medical History:   Diagnosis Date   Atrial fibrillation (HCC)    CAD (coronary artery disease)    Diverticulosis    Hypercholesteremia    Hypertension    Hypothyroidism    Insomnia    Murmur    Paroxysmal atrial fibrillation (HCC)    Urinary dysfunction    Past Surgical History:  Procedure Laterality Date   BYPASS GRAFT     HEMORRHOID SURGERY     HERNIA REPAIR     HIP SURGERY      Allergies  Allergen Reactions   Cranberry Extract Cough   Shellfish Allergy Swelling and Rash    Outpatient Encounter Medications as of 03/03/2023  Medication Sig   Ascorbic Acid (VITAMIN C) 1000 MG tablet Take 1,000 mg by mouth in the morning and at bedtime.   aspirin 81 MG EC tablet in the morning and at bedtime.    cholecalciferol (VITAMIN D) 1000 UNITS tablet Take 1,000 Units by mouth daily.   docusate sodium (COLACE) 100 MG capsule Take 200 mg by mouth at bedtime.   levothyroxine (SYNTHROID) 50 MCG tablet Take 50 mcg by mouth daily before breakfast.   Multiple Vitamins-Minerals (PRESERVISION AREDS 2 PO) Take by mouth daily.   Multiple Vitamins-Minerals (THEREMS-M) TABS Take 1 tablet by mouth daily.   nystatin (MYCOSTATIN/NYSTOP) powder Apply 1 Application topically 2 (two) times daily. Apply to (L) groin area topically two times a day related to [facility] AND  OTHER NONSPECIFIC SKIN ERUPTION   omega-3 acid ethyl esters (LOVAZA) 1 g capsule Take 1 g by mouth 2 (two) times daily.   Polyethyl Glycol-Propyl Glycol (SYSTANE) 0.4-0.3 % SOLN Place 2 drops into the left eye at bedtime.   Polyethylene Glycol 3350 (PEG 3350) 17 GM/SCOOP POWD Give 17gm one time daily.   potassium chloride (MICRO-K) 10 MEQ CR capsule Take 10 mEq by mouth daily.   saccharomyces boulardii (FLORASTOR) 250 MG capsule Take 250 mg by mouth daily.   torsemide (DEMADEX) 20 MG tablet Take 20 mg by mouth daily.   zolpidem (AMBIEN) 5 MG tablet Take 1 tablet (5 mg total) by mouth at bedtime. TAKE ONE TABLET AT BEDTIME   No facility-administered encounter  medications on file as of 03/03/2023.    Review of Systems  Constitutional:  Negative for chills and fever.  Respiratory:  Negative for cough, shortness of breath and wheezing.   Cardiovascular:  Positive for leg swelling. Negative for chest pain and palpitations.  Gastrointestinal:  Negative for abdominal distention, abdominal pain, blood in stool, constipation, nausea and vomiting.  Genitourinary:  Negative for dysuria and urgency.  Neurological:  Negative for dizziness.    Immunization History  Administered Date(s) Administered   Influenza, High Dose Seasonal PF 04/11/2017, 03/26/2020   Influenza-Unspecified 03/26/2020, 04/01/2021, 04/12/2022   Moderna SARS-COV2 Booster Vaccination 04/06/2020   Moderna Sars-Covid-2 Vaccination 06/18/2019, 07/16/2019, 10/30/2021   PFIZER(Purple Top)SARS-COV-2 Vaccination 09/14/2020, 03/03/2021   Pfizer Covid-19 Vaccine Bivalent Booster 2yrs & up 04/15/2022   Pneumococcal-Unspecified 08/12/2012   Tetanus 06/14/2004   Zoster Recombinant(Shingrix) 02/03/2017, 06/02/2017   Pertinent  Health Maintenance Due  Topic Date Due   INFLUENZA VACCINE  01/13/2023      06/22/2022   11:47 AM 07/20/2022   10:38 AM 09/07/2022   10:32 AM 10/18/2022   10:21 AM 12/17/2022    2:32 PM  Fall Risk  Falls in the past year? 0 0 1 1 1   Was there an injury with Fall? 0 0 0 0 1  Fall Risk Category Calculator 0 0 2 2 3   Fall Risk Category (Retired) Low      (RETIRED) Patient Fall Risk Level Low fall risk      Patient at Risk for Falls Due to History of fall(s) History of fall(s) History of fall(s);Impaired balance/gait;Impaired mobility History of fall(s);Impaired balance/gait History of fall(s);Impaired balance/gait;Impaired mobility  Fall risk Follow up Falls evaluation completed Falls evaluation completed Falls evaluation completed Falls evaluation completed Falls evaluation completed   Functional Status Survey:    Vitals:   03/03/23 1222  BP: 139/63  Pulse: (!) 40   Resp: 16  Temp: 97.6 F (36.4 C)  TempSrc: Temporal  SpO2: 94%  Weight: 159 lb 14.4 oz (72.5 kg)  Height: 5\' 4"  (1.626 m)   Body mass index is 27.45 kg/m. Physical Exam Constitutional:      Appearance: Normal appearance.  HENT:     Head: Normocephalic and atraumatic.  Cardiovascular:     Rate and Rhythm: Normal rate and regular rhythm.     Pulses: Normal pulses.     Heart sounds: Normal heart sounds.  Pulmonary:     Effort: No respiratory distress.     Breath sounds: No stridor. No wheezing or rales.  Abdominal:     General: Bowel sounds are normal. There is no distension.     Palpations: Abdomen is soft.     Tenderness: There is no abdominal tenderness. There is no right CVA tenderness or guarding.  Musculoskeletal:        General: Swelling (Chronic swelling) present.  Neurological:     Mental Status: He is alert. Mental status is at baseline.     Labs reviewed: Recent Labs    09/07/22 0000 12/03/22 0000  NA 140 139  K 4.2 3.7  CL 102 102  CO2 32* 28*  BUN 28* 33*  CREATININE 1.5* 1.7*  CALCIUM 9.1 8.8   Recent Labs    09/07/22 0000 12/03/22 0000  AST 17 18  ALT 13 11  ALKPHOS  --  65  ALBUMIN 3.4* 3.3*   Recent Labs    09/07/22 0000 12/02/22 0000  WBC 6.0 8.0  NEUTROABS 3,360.00 6,280.00  HGB 13.8 14.2  HCT 41 42  PLT 189 212   Lab Results  Component Value Date   TSH 2.91 09/07/2022   No results found for: "HGBA1C" Lab Results  Component Value Date   CHOL 183 05/05/2020   HDL 54 05/05/2020   LDLCALC 107 (H) 05/05/2020   TRIG 128 05/05/2020   CHOLHDL 3.4 05/05/2020    Significant Diagnostic Results in last 30 days:  No results found.  Assessment/Plan  Superficial wound Rt lower leg  No exudates No signs of infection  Cont with xeroform and non adherent dressing  Skin lesion on scalp  Daughter is requesting him to be seen by dermatology  Referral placed  Paroxysmal Afib h/o tachy-brady  Refused PPM   Chronic lower  extremity swelling  As per nursing staff he refuses to wear compression stockings Cont with torsemide   H/O CVA,, CAD Cont with aspirin   Hypothyroidism  Cont with levothyroxine  CKD stage 3 b  Cr 1.68  Will check bmp   Hypothyroidism  Cont with synthyroid    Insomnia  cont with Remus Loffler    Family/ staff Communication: care plan discussed with the nursing staff   Labs/tests ordered:  labs reviewed bmp, TSH

## 2023-03-07 ENCOUNTER — Encounter: Payer: Self-pay | Admitting: Sports Medicine

## 2023-03-08 LAB — COMPREHENSIVE METABOLIC PANEL
Calcium: 8.6 — AB (ref 8.7–10.7)
eGFR: 43

## 2023-03-08 LAB — TSH: TSH: 2.58 (ref 0.41–5.90)

## 2023-03-08 LAB — BASIC METABOLIC PANEL
BUN: 26 — AB (ref 4–21)
CO2: 27 — AB (ref 13–22)
Chloride: 103 (ref 99–108)
Creatinine: 1.5 — AB (ref 0.6–1.3)
Glucose: 85
Potassium: 4 meq/L (ref 3.5–5.1)
Sodium: 139 (ref 137–147)

## 2023-03-18 ENCOUNTER — Non-Acute Institutional Stay (SKILLED_NURSING_FACILITY): Payer: Self-pay | Admitting: Sports Medicine

## 2023-03-18 DIAGNOSIS — M7989 Other specified soft tissue disorders: Secondary | ICD-10-CM

## 2023-03-18 NOTE — Progress Notes (Unsigned)
Provider:  Jacalyn Lefevre, MD Location:      Place of Service:     PCP: Mast, Man X, NP Patient Care Team: Mast, Man X, NP as PCP - General (Internal Medicine) Lyn Records, MD (Inactive) as PCP - Cardiology (Cardiology)  Extended Emergency Contact Information Primary Emergency Contact: Laurie Panda Address: [address]          [place], [place] Home Phone: [phone] Relation: Spouse Secondary Emergency Contact: Lorretta Harp Address: [address]          [place], [place] [address] [place] Home Phone: [phone] Relation: Daughter  Code Status:  Goals of Care: Advanced Directive information    03/03/2023   12:23 PM  Advanced Directives  Does Patient Have a Medical Advance Directive? Yes  Type of Advance Directive Living will;Out of facility DNR (pink MOST or yellow form)  Does patient want to make changes to medical advance directive? No - Patient declined  Pre-existing out of facility DNR order (yellow form or pink MOST form) Pink MOST form placed in chart (order not valid for inpatient use);Yellow form placed in chart (order not valid for inpatient use)      No chief complaint on file.   HPI: Patient is a 89 y.o. male seen today for   Past Medical History:  Diagnosis Date   Atrial fibrillation (HCC)    CAD (coronary artery disease)    Diverticulosis    Hypercholesteremia    Hypertension    Hypothyroidism    Insomnia    Murmur    Paroxysmal atrial fibrillation (HCC)    Urinary dysfunction    Past Surgical History:  Procedure Laterality Date   BYPASS GRAFT     HEMORRHOID SURGERY     HERNIA REPAIR     HIP SURGERY      reports that he has quit smoking. He has never used smokeless tobacco. He reports current alcohol use. He reports that he does not use drugs. Social History   Socioeconomic History   Marital status: Married    Spouse name: Not on file   Number of children: Not on file   Years of education:  Not on file   Highest education level: Not on file  Occupational History   Not on file  Tobacco Use   Smoking status: Former   Smokeless tobacco: Never  Substance and Sexual Activity   Alcohol use: Yes    Comment: occasional   Drug use: No   Sexual activity: Not Currently  Other Topics Concern   Not on file  Social History Narrative   Social History     Tobacco Use       Smoking status: None       Smokeless tobacco: Never Used     Alcohol use: No       Alcohol/week: 0.0 standard drinks     Drug use: No   Diet is good   No caffeine   Married since 1952   Lives in apartment, one story   Lives with full time home health   Pet- Cat (Tai)   Highest level of education: PHD   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   No difficulty affording medications   Social Determinants of Health   Financial Resource Strain: Not  on file  Food Insecurity: Not on file  Transportation Needs: Not on file  Physical Activity: Not on file  Stress: Not on file  Social Connections: Not on file  Intimate Partner Violence: Not on file    Functional Status Survey:    Family History  Problem Relation Age of Onset   Heart disease Mother    Cancer Father    ALS Brother    Heart disease Daughter     Health Maintenance  Topic Date Due   INFLUENZA VACCINE  01/13/2023   COVID-19 Vaccine (8 - 2023-24 season) 02/13/2023   Medicare Annual Wellness (AWV)  06/23/2023   Pneumonia Vaccine 81+ Years old  Completed   Zoster Vaccines- Shingrix  Completed   HPV VACCINES  Aged Out   DTaP/Tdap/Td  Discontinued    Allergies  Allergen Reactions   Cranberry Extract Cough   Shellfish Allergy Swelling and Rash    Outpatient Encounter Medications as of 03/18/2023  Medication Sig   Ascorbic Acid (VITAMIN C) 1000 MG tablet Take  1,000 mg by mouth in the morning and at bedtime.   aspirin 81 MG EC tablet in the morning and at bedtime.    cholecalciferol (VITAMIN D) 1000 UNITS tablet Take 1,000 Units by mouth daily.   docusate sodium (COLACE) 100 MG capsule Take 200 mg by mouth at bedtime.   levothyroxine (SYNTHROID) 50 MCG tablet Take 50 mcg by mouth daily before breakfast.   Multiple Vitamins-Minerals (PRESERVISION AREDS 2 PO) Take by mouth daily.   Multiple Vitamins-Minerals (THEREMS-M) TABS Take 1 tablet by mouth daily.   nystatin (MYCOSTATIN/NYSTOP) powder Apply 1 Application topically 2 (two) times daily. Apply to (L) groin area topically two times a day related to RASH AND OTHER NONSPECIFIC SKIN ERUPTION   omega-3 acid ethyl esters (LOVAZA) 1 g capsule Take 1 g by mouth 2 (two) times daily.   Polyethyl Glycol-Propyl Glycol (SYSTANE) 0.4-0.3 % SOLN Place 2 drops into the left eye at bedtime.   Polyethylene Glycol 3350 (PEG 3350) 17 GM/SCOOP POWD Give 17gm one time daily.   potassium chloride (MICRO-K) 10 MEQ CR capsule Take 10 mEq by mouth daily.   saccharomyces boulardii (FLORASTOR) 250 MG capsule Take 250 mg by mouth daily.   torsemide (DEMADEX) 20 MG tablet Take 20 mg by mouth daily.   zolpidem (AMBIEN) 5 MG tablet Take 1 tablet (5 mg total) by mouth at bedtime. TAKE ONE TABLET AT BEDTIME   No facility-administered encounter medications on file as of 03/18/2023.    Review of Systems  There were no vitals filed for this visit. There is no height or weight on file to calculate BMI. Physical Exam Constitutional:      Appearance: Normal appearance.  HENT:     Head: Normocephalic and atraumatic.  Cardiovascular:     Rate and Rhythm: Normal rate and regular rhythm.     Pulses: Normal pulses.     Heart sounds: Normal heart sounds.  Pulmonary:     Effort: No respiratory distress.     Breath sounds: No stridor. Rales (lower half crackles) present. No wheezing.  Abdominal:     General: Bowel sounds are  normal. There is no distension.     Palpations: Abdomen is soft.     Tenderness: There is no abdominal tenderness. There is no right CVA tenderness or guarding.  Musculoskeletal:        General: Swelling present.     Comments: Rt leg swollen  2+ pitting odema Superficial wound rt  leg- minimal drainage  No erythema   Neurological:     Mental Status: He is alert. Mental status is at baseline.     Sensory: No sensory deficit.     Motor: No weakness.     Labs reviewed: Basic Metabolic Panel: Recent Labs    09/07/22 0000 12/03/22 0000  NA 140 139  K 4.2 3.7  CL 102 102  CO2 32* 28*  BUN 28* 33*  CREATININE 1.5* 1.7*  CALCIUM 9.1 8.8   Liver Function Tests: Recent Labs    09/07/22 0000 12/03/22 0000  AST 17 18  ALT 13 11  ALKPHOS  --  65  ALBUMIN 3.4* 3.3*   No results for input(s): "LIPASE", "AMYLASE" in the last 8760 hours. No results for input(s): "AMMONIA" in the last 8760 hours. CBC: Recent Labs    09/07/22 0000 12/02/22 0000  WBC 6.0 8.0  NEUTROABS 3,360.00 6,280.00  HGB 13.8 14.2  HCT 41 42  PLT 189 212   Cardiac Enzymes: No results for input(s): "CKTOTAL", "CKMB", "CKMBINDEX", "TROPONINI" in the last 8760 hours. BNP: Invalid input(s): "POCBNP" No results found for: "HGBA1C" Lab Results  Component Value Date   TSH 2.91 09/07/2022   No results found for: "VITAMINB12" No results found for: "FOLATE" No results found for: "IRON", "TIBC", "FERRITIN"  Imaging and Procedures obtained prior to SNF admission: No results found.  Assessment/Plan There are no diagnoses linked to this encounter.   Family/ staff Communication:   Labs/tests ordered:  .smmsig

## 2023-03-21 ENCOUNTER — Encounter: Payer: Self-pay | Admitting: Sports Medicine

## 2023-03-22 LAB — BASIC METABOLIC PANEL
BUN: 30 — AB (ref 4–21)
CO2: 31 — AB (ref 13–22)
Chloride: 99 (ref 99–108)
Creatinine: 1.6 — AB (ref 0.6–1.3)
Glucose: 132
Potassium: 3.8 meq/L (ref 3.5–5.1)
Sodium: 141 (ref 137–147)

## 2023-03-22 LAB — COMPREHENSIVE METABOLIC PANEL
Calcium: 9 (ref 8.7–10.7)
eGFR: 38

## 2023-03-29 ENCOUNTER — Other Ambulatory Visit: Payer: Self-pay | Admitting: Adult Health

## 2023-03-29 DIAGNOSIS — G4700 Insomnia, unspecified: Secondary | ICD-10-CM

## 2023-03-29 MED ORDER — ZOLPIDEM TARTRATE 5 MG PO TABS: 5.0000 mg | ORAL_TABLET | Freq: Every day | ORAL | 0 refills | Status: AC

## 2023-03-30 ENCOUNTER — Non-Acute Institutional Stay (SKILLED_NURSING_FACILITY): Payer: Medicare PPO | Admitting: Adult Health

## 2023-03-30 ENCOUNTER — Encounter: Payer: Self-pay | Admitting: Adult Health

## 2023-03-30 DIAGNOSIS — F5101 Primary insomnia: Secondary | ICD-10-CM

## 2023-03-30 DIAGNOSIS — R600 Localized edema: Secondary | ICD-10-CM

## 2023-03-30 DIAGNOSIS — N1832 Chronic kidney disease, stage 3b: Secondary | ICD-10-CM

## 2023-03-30 DIAGNOSIS — I5032 Chronic diastolic (congestive) heart failure: Secondary | ICD-10-CM | POA: Diagnosis not present

## 2023-03-30 DIAGNOSIS — E039 Hypothyroidism, unspecified: Secondary | ICD-10-CM | POA: Diagnosis not present

## 2023-03-30 NOTE — Progress Notes (Signed)
Location:  [facility] Room Number: [id number] Place of Service:  SNF (31) Provider:  Kenard Gower, DNP, FNP-BC  Patient Care Team: Mast, Man X, NP as PCP - General (Internal Medicine) Lyn Records, MD (Inactive) as PCP - Cardiology (Cardiology)  Extended Emergency Contact Information Primary Emergency Contact: Selmer,Jean Address: [address]          [place], [place] Home Phone: [phone] Relation: Spouse Secondary Emergency Contact: Lorretta Harp Address: [address]          [place], [place] [address] [place] Home Phone: [phone] Relation: Daughter  Code Status:  DNR  Goals of care: Advanced Directive information    03/30/2023    1:09 PM  Advanced Directives  Does Patient Have a Medical Advance Directive? Yes  Type of Advance Directive Living will;Out of facility DNR (pink MOST or yellow form)  Does patient want to make changes to medical advance directive? No - Patient declined  Pre-existing out of facility DNR order (yellow form or pink MOST form) Pink MOST form placed in chart (order not valid for inpatient use);Yellow form placed in chart (order not valid for inpatient use)     Chief Complaint  Patient presents with   Medical Management of Chronic Issues    Routine Visit    HPI:  Pt is a 89 y.o. male seen today for medical management of chronic diseases.  She is a resident of [facility].  Chronic diastolic heart failure (HCC) -  no SOB, BNP 84 (04/01/23) takes torsemide  Lower extremity edema  -  has 2+edema BLE, denies pain, negative for DVT per ultrasound done on 03/21/2023, refuses to wear compressions socks, currently on torsemide  Hypothyroidism, unspecified type -  tsh 2.58, takes levothyroxine  Chronic kidney disease, stage 3b (HCC)  -  GFR 38    Past Medical History:  Diagnosis Date   Atrial fibrillation (HCC)    CAD (coronary artery disease)     Diverticulosis    Hypercholesteremia    Hypertension    Hypothyroidism    Insomnia    Murmur    Paroxysmal atrial fibrillation (HCC)    Urinary dysfunction    Past Surgical History:  Procedure Laterality Date   BYPASS GRAFT     HEMORRHOID SURGERY     HERNIA REPAIR     HIP SURGERY      Allergies  Allergen Reactions   Cranberry Extract Cough   Shellfish Allergy Swelling and Rash    Outpatient Encounter Medications as of 03/30/2023  Medication Sig   Ascorbic Acid (VITAMIN C) 1000 MG tablet Take 1,000 mg by mouth in the morning and at bedtime.   aspirin 81 MG EC tablet in the morning and at bedtime.    cholecalciferol (VITAMIN D) 1000 UNITS tablet Take 1,000 Units by mouth daily.   docusate sodium (COLACE) 100 MG capsule Take 200 mg by mouth at bedtime.   levothyroxine (SYNTHROID) 50 MCG tablet Take 50 mcg by mouth daily before breakfast.   Multiple Vitamins-Minerals (PRESERVISION AREDS 2 PO) Take by mouth daily.   Multiple Vitamins-Minerals (THEREMS-M) TABS Take 1 tablet by mouth daily.   nystatin (MYCOSTATIN/NYSTOP) powder Apply 1 Application topically 2 (two) times daily. Apply to (L) groin area topically two times a day related to RASH AND OTHER NONSPECIFIC SKIN ERUPTION   omega-3 acid ethyl esters (LOVAZA) 1 g capsule Take 1 g by mouth 2 (two) times daily.  Polyethyl Glycol-Propyl Glycol (SYSTANE) 0.4-0.3 % SOLN Place 2 drops into the left eye at bedtime.   Polyethylene Glycol 3350 (PEG 3350) 17 GM/SCOOP POWD Give 17gm one time daily.   potassium chloride (MICRO-K) 10 MEQ CR capsule Take 10 mEq by mouth daily.   saccharomyces boulardii (FLORASTOR) 250 MG capsule Take 250 mg by mouth daily.   torsemide (DEMADEX) 20 MG tablet Take 20 mg by mouth daily.   zolpidem (AMBIEN) 5 MG tablet Take 1 tablet (5 mg total) by mouth at bedtime. TAKE ONE TABLET AT BEDTIME   No facility-administered encounter medications on file as of 03/30/2023.    Review of Systems  Constitutional:   Negative for activity change, appetite change and fever.  HENT:  Negative for sore throat.   Eyes: Negative.   Cardiovascular:  Positive for leg swelling. Negative for chest pain.  Gastrointestinal:  Negative for abdominal distention, diarrhea and vomiting.  Genitourinary:  Negative for dysuria, frequency and urgency.  Skin:  Negative for color change.  Neurological:  Negative for dizziness and headaches.  Psychiatric/Behavioral:  Negative for behavioral problems and sleep disturbance. The patient is not nervous/anxious.       Immunization History  Administered Date(s) Administered   Fluad Quad(high Dose 65+) 03/16/2023   Influenza, High Dose Seasonal PF 04/11/2017, 03/26/2020   Influenza-Unspecified 03/26/2020, 04/01/2021, 04/12/2022   Moderna SARS-COV2 Booster Vaccination 04/06/2020   Moderna Sars-Covid-2 Vaccination 06/18/2019, 07/16/2019, 10/30/2021   PFIZER(Purple Top)SARS-COV-2 Vaccination 09/14/2020, 03/03/2021   Pfizer Covid-19 Vaccine Bivalent Booster 54yrs & up 04/15/2022   Pneumococcal-Unspecified 08/12/2012   Tetanus 06/14/2004   Zoster Recombinant(Shingrix) 02/03/2017, 06/02/2017   Pertinent  Health Maintenance Due  Topic Date Due   INFLUENZA VACCINE  Completed      06/22/2022   11:47 AM 07/20/2022   10:38 AM 09/07/2022   10:32 AM 10/18/2022   10:21 AM 12/17/2022    2:32 PM  Fall Risk  Falls in the past year? 0 0 1 1 1   Was there an injury with Fall? 0 0 0 0 1  Fall Risk Category Calculator 0 0 2 2 3   Fall Risk Category (Retired) Low      (RETIRED) Patient Fall Risk Level Low fall risk      Patient at Risk for Falls Due to History of fall(s) History of fall(s) History of fall(s);Impaired balance/gait;Impaired mobility History of fall(s);Impaired balance/gait History of fall(s);Impaired balance/gait;Impaired mobility  Fall risk Follow up Falls evaluation completed Falls evaluation completed Falls evaluation completed Falls evaluation completed Falls evaluation completed      Vitals:   03/30/23 1304  BP: (!) 149/83  Pulse: 78  Resp: 20  Temp: (!) 97.5 F (36.4 C)  SpO2: 95%  Weight: 153 lb (69.4 kg)  Height: 5\' 4"  (1.626 m)   Body mass index is 26.26 kg/m.  Physical Exam Constitutional:      General: He is not in acute distress.    Appearance: Normal appearance.  HENT:     Head: Normocephalic and atraumatic.     Mouth/Throat:     Mouth: Mucous membranes are moist.  Eyes:     Conjunctiva/sclera: Conjunctivae normal.  Cardiovascular:     Rate and Rhythm: Normal rate and regular rhythm.     Pulses: Normal pulses.     Heart sounds: Normal heart sounds.  Pulmonary:     Effort: Pulmonary effort is normal.     Breath sounds: Normal breath sounds.  Abdominal:     General: Bowel sounds are normal.  Palpations: Abdomen is soft.  Musculoskeletal:        General: Swelling present. Normal range of motion.     Cervical back: Normal range of motion.     Right lower leg: Edema present.     Left lower leg: Edema present.     Comments: BLE 2+edema  Skin:    General: Skin is warm and dry.  Neurological:     General: No focal deficit present.     Mental Status: He is alert.     Comments: Alert to self and place, disoriented to time  Psychiatric:        Mood and Affect: Mood normal.        Behavior: Behavior normal.        Labs reviewed: Recent Labs    12/03/22 0000 03/08/23 0000 03/22/23 0000  NA 139 139 141  K 3.7 4.0 3.8  CL 102 103 99  CO2 28* 27* 31*  BUN 33* 26* 30*  CREATININE 1.7* 1.5* 1.6*  CALCIUM 8.8 8.6* 9.0   Recent Labs    09/07/22 0000 12/03/22 0000  AST 17 18  ALT 13 11  ALKPHOS  --  65  ALBUMIN 3.4* 3.3*   Recent Labs    09/07/22 0000 12/02/22 0000  WBC 6.0 8.0  NEUTROABS 3,360.00 6,280.00  HGB 13.8 14.2  HCT 41 42  PLT 189 212   Lab Results  Component Value Date   TSH 2.58 03/08/2023   No results found for: "HGBA1C" Lab Results  Component Value Date   CHOL 183 05/05/2020   HDL 54  05/05/2020   LDLCALC 107 (H) 05/05/2020   TRIG 128 05/05/2020   CHOLHDL 3.4 05/05/2020    Significant Diagnostic Results in last 30 days:  No results found.  Assessment/Plan  .1. Chronic diastolic heart failure (HCC) -  no SOB -  recently increased Torsemide due to lower extremity edema -  continue Torsemide  2. Lower extremity edema Wt Readings from Last 3 Encounters:  03/30/23 153 lb (69.4 kg)  03/18/23 156 lb 1.6 oz (70.8 kg)  03/03/23 159 lb 14.4 oz (72.5 kg)    - recently increased Torsemide to 20 mg daily -  had 3 lbs weight loss in 12 days -  refused compression socks -  encouraged to elevate BLE at night and when sitting down  3. Hypothyroidism, unspecified type Lab Results  Component Value Date   TSH 2.58 03/08/2023    -Continue levothyroxine  4. Chronic kidney disease, stage 3b [facility]) Lab Results  Component Value Date   NA 141 03/22/2023   K 3.8 03/22/2023   CO2 31 (A) 03/22/2023   GLUCOSE 89 01/14/2021   BUN 30 (A) 03/22/2023   CREATININE 1.6 (A) 03/22/2023   CALCIUM 9.0 03/22/2023   EGFR 38 03/22/2023   GFRNONAA 52 (L) 11/22/2020    -Stable  5. Primary insomnia -  stated that he sleeps well at night -Continue Ambien    Family/ staff Communication: Discussed plan of care with resident and charge nurse.  Labs/tests ordered:   None     Kenard Gower, DNP, MSN, FNP-BC [facility] [phone] (Monday-Friday 8:00 a.m. - 5:00 p.m.) [phone] (after hours)

## 2023-04-29 ENCOUNTER — Encounter: Payer: Self-pay | Admitting: Nurse Practitioner

## 2023-04-29 ENCOUNTER — Non-Acute Institutional Stay (SKILLED_NURSING_FACILITY): Payer: Self-pay | Admitting: Nurse Practitioner

## 2023-04-29 DIAGNOSIS — E785 Hyperlipidemia, unspecified: Secondary | ICD-10-CM

## 2023-04-29 DIAGNOSIS — I6300 Cerebral infarction due to thrombosis of unspecified precerebral artery: Secondary | ICD-10-CM

## 2023-04-29 DIAGNOSIS — G4700 Insomnia, unspecified: Secondary | ICD-10-CM

## 2023-04-29 DIAGNOSIS — R350 Frequency of micturition: Secondary | ICD-10-CM

## 2023-04-29 DIAGNOSIS — I270 Primary pulmonary hypertension: Secondary | ICD-10-CM | POA: Diagnosis not present

## 2023-04-29 DIAGNOSIS — Z66 Do not resuscitate: Secondary | ICD-10-CM | POA: Diagnosis not present

## 2023-04-29 DIAGNOSIS — E039 Hypothyroidism, unspecified: Secondary | ICD-10-CM

## 2023-04-29 DIAGNOSIS — N183 Chronic kidney disease, stage 3 unspecified: Secondary | ICD-10-CM

## 2023-04-29 DIAGNOSIS — K5901 Slow transit constipation: Secondary | ICD-10-CM

## 2023-04-29 DIAGNOSIS — D6859 Other primary thrombophilia: Secondary | ICD-10-CM

## 2023-04-29 DIAGNOSIS — I2510 Atherosclerotic heart disease of native coronary artery without angina pectoris: Secondary | ICD-10-CM

## 2023-04-29 DIAGNOSIS — I480 Paroxysmal atrial fibrillation: Secondary | ICD-10-CM

## 2023-04-29 NOTE — Assessment & Plan Note (Signed)
takes Zolpidem for years, R vs B was explained to the patient.

## 2023-04-29 NOTE — Assessment & Plan Note (Signed)
LDL 107 05/05/20, on Omega 3 supplement °

## 2023-04-29 NOTE — Assessment & Plan Note (Signed)
Stable, takes Colace.  

## 2023-04-29 NOTE — Assessment & Plan Note (Signed)
tachy brady, refused PPM. EKG 12/05/19 Afib at cardiology, only takes ASA

## 2023-04-29 NOTE — Assessment & Plan Note (Signed)
on ASA, Fish oil, Coronary artery bypass grafting 1992 

## 2023-04-29 NOTE — Assessment & Plan Note (Signed)
Bun/creat 30/1.6 03/22/23

## 2023-04-29 NOTE — Assessment & Plan Note (Signed)
takes Levothyroxine, TSH 2.58 03/08/23

## 2023-04-29 NOTE — Assessment & Plan Note (Signed)
had work up in the past, venous US negative DVT in legs.  

## 2023-04-29 NOTE — Assessment & Plan Note (Signed)
no difference since off Mirabegran due to cost, had urinary retention, s/p ED eval 11/22/20, f/u urology 

## 2023-04-29 NOTE — Assessment & Plan Note (Signed)
Pulmonary hypertension/edema BLE, takes Torsemide, [place] 6/18/21negative DVT, last echo 2018 EF 55-60%, CXR 12/02/22 no acute findings.

## 2023-04-29 NOTE — Progress Notes (Unsigned)
Location:  [facility] Room Number: [id number]-A Place of Service:  SNF (31) Provider:  Haydin Calandra,ManX,NP  Lorie Cleckley X, NP  Patient Care Team: Laycee Fitzsimmons X, NP as PCP - General (Internal Medicine) Lyn Records, MD (Inactive) as PCP - Cardiology (Cardiology)  Extended Emergency Contact Information Primary Emergency Contact: Villanueva,Jean Address: [address]          [place], [place] Home Phone: [phone] Relation: Spouse Secondary Emergency Contact: Lorretta Harp Address: [address]          [place], [place] [address] [place] Home Phone: [phone] Relation: Daughter  Code Status:  DNR Goals of care: Advanced Directive information    04/29/2023   10:57 AM  Advanced Directives  Does Patient Have a Medical Advance Directive? Yes  Type of Advance Directive Living will;Out of facility DNR (pink MOST or yellow form)  Does patient want to make changes to medical advance directive? No - Patient declined  Pre-existing out of facility DNR order (yellow form or pink MOST form) Pink MOST form placed in chart (order not valid for inpatient use);Yellow form placed in chart (order not valid for inpatient use)     Chief Complaint  Patient presents with  . Medical Management of Chronic Issues    Routine visit and discuss covid vaccine and medicare annual wellness visit.    HPI:  Pt is a 89 y.o. male seen today for medical management of chronic diseases.     Chronic blepharitis/lower eyelids ectropion, mild crusted eyelashes, patient declined ophthalmic ointment.               Afib, tachy brady, refused PPM. EKG 12/05/19 Afib at cardiology, only takes ASA             Thrombophilia, had work up in the past, venous US negative DVT in legs.               Urinary frequency, no difference since off Mirabegran due to cost, had urinary retention, s/p ED eval 11/22/20, f/u urology             CAD, on ASA, Fish oil, Coronary artery bypass  grafting 1992             Hx of GI bleed, Hgb 14.2 12/02/22             CVA, slightly left sided weakness with muscle strength 5/5             HTN, controlled, on Torsemide.  Pulmonary hypertension/edema BLE, takes Torsemide, [place] 6/18/21negative DVT, last echo 2018 EF 55-60%, CXR 12/02/22 no acute findings.  Insomnia, takes Zolpidem             Hypothyroidism, takes Levothyroxine, TSH 2.58 03/08/23             CKD Bun/creat 30/1.6 03/22/23             Hyperlipidemia, LDL 107 05/05/20, on Omega 3 supplement             Constipation, takes Colace.                 Past Medical History:  Diagnosis Date  . Atrial fibrillation (HCC)   . CAD (coronary artery disease)   . Diverticulosis   . Hypercholesteremia   . Hypertension   . Hypothyroidism   . Insomnia   . Murmur   . Paroxysmal atrial fibrillation (HCC)   . Urinary dysfunction  Past Surgical History:  Procedure Laterality Date  . BYPASS GRAFT    . HEMORRHOID SURGERY    . HERNIA REPAIR    . HIP SURGERY      Allergies  Allergen Reactions  . Cranberry Extract Cough  . Shellfish Allergy Swelling and Rash    Outpatient Encounter Medications as of 04/29/2023  Medication Sig  . Ascorbic Acid (VITAMIN C) 1000 MG tablet Take 1,000 mg by mouth in the morning and at bedtime.  Marland Kitchen aspirin 81 MG EC tablet in the morning and at bedtime.   . cholecalciferol (VITAMIN D) 1000 UNITS tablet Take 1,000 Units by mouth daily.  Marland Kitchen docusate sodium (COLACE) 100 MG capsule Take 200 mg by mouth at bedtime.  Marland Kitchen levothyroxine (SYNTHROID) 50 MCG tablet Take 50 mcg by mouth daily before breakfast.  . Multiple Vitamins-Minerals (PRESERVISION AREDS 2 PO) Take by mouth daily.  . Multiple Vitamins-Minerals (THEREMS-M) TABS Take 1 tablet by mouth daily.  Marland Kitchen nystatin (MYCOSTATIN/NYSTOP) powder Apply 1 Application topically 2 (two) times daily. Apply to (L) groin area topically two times a day related to RASH AND OTHER NONSPECIFIC SKIN ERUPTION  . omega-3 acid  ethyl esters (LOVAZA) 1 g capsule Take 1 g by mouth 2 (two) times daily.  Bertram Gala Glycol-Propyl Glycol (SYSTANE) 0.4-0.3 % SOLN Place 2 drops into the left eye at bedtime.  . Polyethylene Glycol 3350 (PEG 3350) 17 GM/SCOOP POWD Give 17gm one time daily.  . potassium chloride (MICRO-K) 10 MEQ CR capsule Take 10 mEq by mouth daily.  Marland Kitchen saccharomyces boulardii (FLORASTOR) 250 MG capsule Take 250 mg by mouth daily.  Marland Kitchen torsemide (DEMADEX) 20 MG tablet Take 20 mg by mouth daily.  Marland Kitchen zolpidem (AMBIEN) 5 MG tablet Take 1 tablet (5 mg total) by mouth at bedtime. TAKE ONE TABLET AT BEDTIME   No facility-administered encounter medications on file as of 04/29/2023.    Review of Systems  Constitutional:  Negative for appetite change, fatigue and fever.       Weight is a few Ibs less over the past year  HENT:  Positive for hearing loss. Negative for congestion and trouble swallowing.   Respiratory:  Positive for shortness of breath. Negative for cough and wheezing.        Occasional nocturnal orthopnea at his baseline.   Cardiovascular:  Positive for leg swelling. Negative for chest pain and palpitations.  Gastrointestinal:  Negative for abdominal pain and constipation.  Genitourinary:  Positive for frequency. Negative for difficulty urinating and urgency.  Musculoskeletal:  Positive for arthralgias and gait problem.  Skin:  Negative for color change.  Neurological:  Negative for speech difficulty, weakness and headaches.  Psychiatric/Behavioral:  Negative for confusion and sleep disturbance. The patient is not nervous/anxious.     Immunization History  Administered Date(s) Administered  . Fluad Quad(high Dose 65+) 03/16/2023  . Influenza, High Dose Seasonal PF 04/11/2017, 03/26/2020  . Influenza-Unspecified 03/26/2020, 04/01/2021, 04/12/2022  . Moderna SARS-COV2 Booster Vaccination 04/06/2020  . Moderna Sars-Covid-2 Vaccination 06/18/2019, 07/16/2019, 10/30/2021  . PFIZER(Purple Top)SARS-COV-2  Vaccination 09/14/2020, 03/03/2021  . [employer], [employer] 90yrs & up 04/15/2022  . Pneumococcal-Unspecified 08/12/2012  . Tetanus 06/14/2004  . Zoster Recombinant(Shingrix) 02/03/2017, 06/02/2017   Pertinent  Health Maintenance Due  Topic Date Due  . INFLUENZA VACCINE  Completed      06/22/2022   11:47 AM 07/20/2022   10:38 AM 09/07/2022   10:32 AM 10/18/2022   10:21 AM 12/17/2022    2:32 PM  Fall  Risk  Falls in the past year? 0 0 1 1 1   Was there an injury with Fall? 0 0 0 0 1  Fall Risk Category Calculator 0 0 2 2 3   Fall Risk Category (Retired) Low      (RETIRED) Patient Fall Risk Level Low fall risk      Patient at Risk for Falls Due to History of fall(s) History of fall(s) History of fall(s);Impaired balance/gait;Impaired mobility History of fall(s);Impaired balance/gait History of fall(s);Impaired balance/gait;Impaired mobility  Fall risk Follow up Falls evaluation completed Falls evaluation completed Falls evaluation completed Falls evaluation completed Falls evaluation completed   Functional Status Survey:    Vitals:   04/29/23 1054 05/02/23 0815  BP: 118/65   Pulse: (!) 52 (!) 52  Resp: 18   Temp: (!) 97.5 F (36.4 C)   SpO2: 96%   Weight: 156 lb 11.2 oz (71.1 kg)   Height: 5\' 4"  (1.626 m)    Body mass index is 26.9 kg/m. Physical Exam Vitals and nursing note reviewed.  Constitutional:      Appearance: Normal appearance.  HENT:     Head: Normocephalic and atraumatic.     Mouth/Throat:     Mouth: Mucous membranes are moist.  Eyes:     Extraocular Movements: Extraocular movements intact.     Conjunctiva/sclera: Conjunctivae normal.     Pupils: Pupils are equal, round, and reactive to light.     Comments: R+L lower eyelid ectropion, mild crusted eyelashes.   Cardiovascular:     Rate and Rhythm: Bradycardia present. Rhythm irregular.     Heart sounds: Murmur heard.  Pulmonary:     Effort: Pulmonary effort is normal.     Breath sounds: Rales  present.     Comments: Bibasilar rales.  Abdominal:     General: Bowel sounds are normal.     Palpations: Abdomen is soft.     Tenderness: There is abdominal tenderness.  Musculoskeletal:     Cervical back: Normal range of motion and neck supple.     Right lower leg: Edema present.     Left lower leg: Edema present.     Comments: 1-2 + edema BLE L>R, uses TED sometimes.  Skin:    General: Skin is warm and dry.     Comments: The right lower shin open area 2x3cm healing, no s/s of infection  Neurological:     General: No focal deficit present.     Mental Status: He is alert and oriented to person, place, and time. Mental status is at baseline.     Motor: Weakness present.     Coordination: Coordination normal.     Gait: Gait abnormal.     Comments: Left sided weakness form previous CVA, muscle strength 5/5  Psychiatric:        Mood and Affect: Mood normal.        Behavior: Behavior normal.        Thought Content: Thought content normal.        Judgment: Judgment normal.    Labs reviewed: Recent Labs    12/03/22 0000 03/08/23 0000 03/22/23 0000  NA 139 139 141  K 3.7 4.0 3.8  CL 102 103 99  CO2 28* 27* 31*  BUN 33* 26* 30*  CREATININE 1.7* 1.5* 1.6*  CALCIUM 8.8 8.6* 9.0   Recent Labs    09/07/22 0000 12/03/22 0000  AST 17 18  ALT 13 11  ALKPHOS  --  65  ALBUMIN 3.4* 3.3*  Recent Labs    09/07/22 0000 12/02/22 0000  WBC 6.0 8.0  NEUTROABS 3,360.00 6,280.00  HGB 13.8 14.2  HCT 41 42  PLT 189 212   Lab Results  Component Value Date   TSH 2.58 03/08/2023   No results found for: "HGBA1C" Lab Results  Component Value Date   CHOL 183 05/05/2020   HDL 54 05/05/2020   LDLCALC 107 (H) 05/05/2020   TRIG 128 05/05/2020   CHOLHDL 3.4 05/05/2020    Significant Diagnostic Results in last 30 days:  No results found.  Assessment/Plan Pulmonary hypertension, primary (HCC) Pulmonary hypertension/edema BLE, takes Torsemide, [place] 6/18/21negative DVT, last echo  2018 EF 55-60%, CXR 12/02/22 no acute findings.   Insomnia takes Zolpidem for years, R vs B was explained to the patient.   Hypothyroidism  takes Levothyroxine, TSH 2.58 03/08/23  CKD (chronic kidney disease) stage 3, GFR 30-59 ml/min (HCC) Bun/creat 30/1.6 03/22/23  Hyperlipidemia LDL 107 05/05/20, on Omega 3 supplement  Slow transit constipation Stable, takes Colace.   CVA (cerebral vascular accident) (HCC) slightly left sided weakness with muscle strength 5/5  CAD (coronary artery disease) on ASA, Fish oil, Coronary artery bypass grafting 1992  Thrombophilia (HCC) had work up in the past, venous US negative DVT in legs.   Urinary frequency  no difference since off Mirabegran due to cost, had urinary retention, s/p ED eval 11/22/20, f/u urology  Paroxysmal atrial fibrillation  tachy brady, refused PPM. EKG 12/05/19 Afib at cardiology, only takes ASA    Family/ staff Communication: plan of care reviewed with the patient and charge nurse.   Labs/tests ordered:  none  Time spend 30 minutes.

## 2023-04-29 NOTE — Assessment & Plan Note (Signed)
slightly left sided weakness with muscle strength 5/5 

## 2023-05-23 ENCOUNTER — Encounter: Payer: Self-pay | Admitting: Sports Medicine

## 2023-05-23 ENCOUNTER — Non-Acute Institutional Stay (SKILLED_NURSING_FACILITY): Payer: Medicare PPO | Admitting: Sports Medicine

## 2023-05-23 DIAGNOSIS — F0390 Unspecified dementia without behavioral disturbance: Secondary | ICD-10-CM | POA: Diagnosis not present

## 2023-05-23 DIAGNOSIS — N1832 Chronic kidney disease, stage 3b: Secondary | ICD-10-CM

## 2023-05-23 DIAGNOSIS — K5901 Slow transit constipation: Secondary | ICD-10-CM

## 2023-05-23 DIAGNOSIS — I480 Paroxysmal atrial fibrillation: Secondary | ICD-10-CM

## 2023-05-23 DIAGNOSIS — I6300 Cerebral infarction due to thrombosis of unspecified precerebral artery: Secondary | ICD-10-CM | POA: Diagnosis not present

## 2023-05-23 DIAGNOSIS — E039 Hypothyroidism, unspecified: Secondary | ICD-10-CM

## 2023-05-23 NOTE — Progress Notes (Signed)
Provider:  Venita Sheffield MD Location:   [facility]   Place of Service:   Skilled care   PCP: Mast, Man X, NP Patient Care Team: Mast, Man X, NP as PCP - General (Internal Medicine) Lyn Records, MD (Inactive) as PCP - Cardiology (Cardiology)  Extended Emergency Contact Information Primary Emergency Contact: Hilburn,Jean Address: [address]          [place], [place] Home Phone: [phone] Relation: Spouse Secondary Emergency Contact: Lorretta Harp Address: [address]          [place], [place] [address] [place] Home Phone: [phone] Relation: Daughter  Code Status:  Goals of Care: Advanced Directive information    04/29/2023   10:57 AM  Advanced Directives  Does Patient Have a Medical Advance Directive? Yes  Type of Advance Directive Living will;Out of facility DNR (pink MOST or yellow form)  Does patient want to make changes to medical advance directive? No - Patient declined  Pre-existing out of facility DNR order (yellow form or pink MOST form) Pink MOST form placed in chart (order not valid for inpatient use);Yellow form placed in chart (order not valid for inpatient use)      No chief complaint on file.   HPI: Patient is a 89 y.o. male seen today for routine visit for chronic disease management  Pt seen and examined in his room. Daughter available during the visit today.  Has no concerns.  Pt enjoys spending most of the time in his room. He does not participate in group activities.  Needs assistance with ADLS, transferrin  Ambulates with wheelchair BIMS score 14 PHQ9 - 0    Has exertional sob, but denies orthopnea Has chronic lower extremity swelling which is improved from last month He is currently on torsemide echo 2018 EF 55-60%,   H/O Afib  H/O tachy brady  Followed with cardiology in the past  Declined PPM Only as aspirin       Past Medical History:  Diagnosis Date   Atrial  fibrillation (HCC)    CAD (coronary artery disease)    Diverticulosis    Hypercholesteremia    Hypertension    Hypothyroidism    Insomnia    Murmur    Paroxysmal atrial fibrillation (HCC)    Urinary dysfunction    Past Surgical History:  Procedure Laterality Date   BYPASS GRAFT     HEMORRHOID SURGERY     HERNIA REPAIR     HIP SURGERY      reports that he has quit smoking. He has never used smokeless tobacco. He reports current alcohol use. He reports that he does not use drugs. Social History   Socioeconomic History   Marital status: Married    Spouse name: Not on file   Number of children: Not on file   Years of education: Not on file   Highest education level: Not on file  Occupational History   Not on file  Tobacco Use   Smoking status: Former   Smokeless tobacco: Never  Substance and Sexual Activity   Alcohol use: Yes    Comment: occasional   Drug use: No   Sexual activity: Not Currently  Other Topics Concern   Not on file  Social History Narrative   Social History     Tobacco Use       Smoking status: None       Smokeless tobacco: Never Used     Alcohol use: No  Alcohol/week: 0.0 standard drinks     Drug use: No   Diet is good   No caffeine   Married since 1952   Lives in apartment, one story   Lives with full time home health   Pet- Cat (Tai)   Highest level of education: PHD   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   No difficulty affording medications   Social Determinants of Health   Financial Resource Strain: Not on file  Food Insecurity: Not on file  Transportation Needs: Not on file  Physical Activity: Not on file  Stress: Not on file  Social Connections: Not on file  Intimate Partner Violence: Not on file    Functional Status Survey:    Family History   Problem Relation Age of Onset   Heart disease Mother    Cancer Father    ALS Brother    Heart disease Daughter     Health Maintenance  Topic Date Due   COVID-19 Vaccine (8 - 2023-24 season) 02/13/2023   Medicare Annual Wellness (AWV)  06/23/2023   Pneumonia Vaccine 80+ Years old  Completed   INFLUENZA VACCINE  Completed   Zoster Vaccines- Shingrix  Completed   HPV VACCINES  Aged Out   DTaP/Tdap/Td  Discontinued    Allergies  Allergen Reactions   Cranberry Extract Cough   Shellfish Allergy Swelling and Rash    Outpatient Encounter Medications as of 05/23/2023  Medication Sig   Ascorbic Acid (VITAMIN C) 1000 MG tablet Take 1,000 mg by mouth in the morning and at bedtime.   aspirin 81 MG EC tablet in the morning and at bedtime.    cholecalciferol (VITAMIN D) 1000 UNITS tablet Take 1,000 Units by mouth daily.   docusate sodium (COLACE) 100 MG capsule Take 200 mg by mouth at bedtime.   levothyroxine (SYNTHROID) 50 MCG tablet Take 50 mcg by mouth daily before breakfast.   Multiple Vitamins-Minerals (PRESERVISION AREDS 2 PO) Take by mouth daily.   Multiple Vitamins-Minerals (THEREMS-M) TABS Take 1 tablet by mouth daily.   nystatin (MYCOSTATIN/NYSTOP) powder Apply 1 Application topically 2 (two) times daily. Apply to (L) groin area topically two times a day related to RASH AND OTHER NONSPECIFIC SKIN ERUPTION   omega-3 acid ethyl esters (LOVAZA) 1 g capsule Take 1 g by mouth 2 (two) times daily.   Polyethyl Glycol-Propyl Glycol (SYSTANE) 0.4-0.3 % SOLN Place 2 drops into the left eye at bedtime.   Polyethylene Glycol 3350 (PEG 3350) 17 GM/SCOOP POWD Give 17gm one time daily.   potassium chloride (MICRO-K) 10 MEQ CR capsule Take 10 mEq by mouth daily.   saccharomyces boulardii (FLORASTOR) 250 MG capsule Take 250 mg by mouth daily.   torsemide (DEMADEX) 20 MG tablet Take 20 mg by mouth daily.   zolpidem (AMBIEN) 5 MG tablet Take 1 tablet (5 mg total) by mouth at bedtime. TAKE ONE  TABLET AT BEDTIME   No facility-administered encounter medications on file as of 05/23/2023.    Review of Systems  There were no vitals filed for this visit. There is no height or weight on file to calculate BMI. Physical Exam Constitutional:      Appearance: Normal appearance.  HENT:     Head: Normocephalic and atraumatic.  Cardiovascular:     Rate and Rhythm: Normal rate and regular  rhythm.     Pulses: Normal pulses.     Heart sounds: Normal heart sounds.  Pulmonary:     Effort: No respiratory distress.     Breath sounds: No stridor. No wheezing or rales.  Abdominal:     General: Bowel sounds are normal. There is no distension.     Palpations: Abdomen is soft.     Tenderness: There is no abdominal tenderness. There is no right CVA tenderness or guarding.  Musculoskeletal:        General: Swelling present.     Comments: 1+ pitting oedema  Neurological:     Mental Status: He is alert. Mental status is at baseline.     Sensory: No sensory deficit.     Motor: No weakness.     Labs reviewed: Basic Metabolic Panel: Recent Labs    12/03/22 0000 03/08/23 0000 03/22/23 0000  NA 139 139 141  K 3.7 4.0 3.8  CL 102 103 99  CO2 28* 27* 31*  BUN 33* 26* 30*  CREATININE 1.7* 1.5* 1.6*  CALCIUM 8.8 8.6* 9.0   Liver Function Tests: Recent Labs    09/07/22 0000 12/03/22 0000  AST 17 18  ALT 13 11  ALKPHOS  --  65  ALBUMIN 3.4* 3.3*   No results for input(s): "LIPASE", "AMYLASE" in the last 8760 hours. No results for input(s): "AMMONIA" in the last 8760 hours. CBC: Recent Labs    09/07/22 0000 12/02/22 0000  WBC 6.0 8.0  NEUTROABS 3,360.00 6,280.00  HGB 13.8 14.2  HCT 41 42  PLT 189 212   Cardiac Enzymes: No results for input(s): "CKTOTAL", "CKMB", "CKMBINDEX", "TROPONINI" in the last 8760 hours. BNP: Invalid input(s): "POCBNP" No results found for: "HGBA1C" Lab Results  Component Value Date   TSH 2.58 03/08/2023   No results found for: "VITAMINB12" No  results found for: "FOLATE" No results found for: "IRON", "TIBC", "FERRITIN"  Imaging and Procedures obtained prior to SNF admission: No results found.   BASIC METABOLIC PANEL GLUCOSE 132 mg/dL 19-14 H Final  Fasting reference interval For someone without known diabetes, a glucose value >125 mg/dL indicates that they may have diabetes and this should be confirmed with a follow-up test. UREA NITROGEN (BUN) 30 mg/dL 7-82 H Final CREATININE 1.62 mg/dL 9.56-2.13 H Final EGFR 38 mL/min/1.73 m2 > OR = 60 L Final BUN/CREATININE RATIO 19 (calc) 6-22 Final SODIUM 141 mmol/L 135-146 Final POTASSIUM 3.8 mmol/L 3.5-5.3 Final CHLORIDE 99 mmol/L 98-110 Final CARBON DIOXIDE 31 mmol/L 20-32 Final CALCIUM 9.0 mg/dL 0.8-65.7 Fina  TSH 8.46 mIU/L 0.40-4.50 Fin Assessment/Plan  1. Major neurocognitive disorder (HCC) Cont with supportive care  No behavioral manifestations as reported by nursing staff  He is oriented to self, but not to time of place   2. Paroxysmal atrial fibrillation (HCC) Cont with aspirin   3. Cerebrovascular accident (CVA) due to thrombosis of precerebral artery (HCC) Cont with supportive care  Cont with aspirin  4. Slow transit constipation Increase fiber intake  Cont with miralax prn   5. Hypothyroidism, unspecified type Cont with synthyroid   6. CKD stage 3b, GFR 30-44 ml/min (HCC) Avoid nephrotoxic meds  Lower extremity swelling Swelling improved Cont with torsemide, potassium supplements Avoid salty foods Elevate feet  Insomnia Cont with ambien  Monitor for excessive day time sedation     Family/ staff Communication:  care plan discussed with the nursing staff   30 Total time spent for obtaining history,  performing a medically appropriate examination and evaluation,  reviewing the tests,ordering  tests,  documenting clinical information in the electronic or other health record, independently interpreting results ,care coordination (not separately  reported)

## 2023-05-31 LAB — BASIC METABOLIC PANEL
BUN: 30 — AB (ref 4–21)
CO2: 29 — AB (ref 13–22)
Chloride: 104 (ref 99–108)
Creatinine: 1.7 — AB (ref 0.6–1.3)
Glucose: 103
Potassium: 4 meq/L (ref 3.5–5.1)
Sodium: 142 (ref 137–147)

## 2023-05-31 LAB — COMPREHENSIVE METABOLIC PANEL
Calcium: 8.8 (ref 8.7–10.7)
eGFR: 35

## 2023-06-07 LAB — BASIC METABOLIC PANEL
BUN: 30 — AB (ref 4–21)
CO2: 31 — AB (ref 13–22)
Chloride: 101 (ref 99–108)
Creatinine: 1.9 — AB (ref 0.6–1.3)
Glucose: 100
Potassium: 4.2 meq/L (ref 3.5–5.1)
Sodium: 140 (ref 137–147)

## 2023-06-07 LAB — COMPREHENSIVE METABOLIC PANEL
Calcium: 9.2 (ref 8.7–10.7)
eGFR: 31

## 2023-06-21 ENCOUNTER — Non-Acute Institutional Stay (SKILLED_NURSING_FACILITY): Payer: Self-pay | Admitting: Nurse Practitioner

## 2023-06-21 ENCOUNTER — Encounter: Payer: Self-pay | Admitting: Nurse Practitioner

## 2023-06-21 DIAGNOSIS — R627 Adult failure to thrive: Secondary | ICD-10-CM

## 2023-06-21 DIAGNOSIS — I10 Essential (primary) hypertension: Secondary | ICD-10-CM

## 2023-06-21 DIAGNOSIS — I2510 Atherosclerotic heart disease of native coronary artery without angina pectoris: Secondary | ICD-10-CM

## 2023-06-21 DIAGNOSIS — I480 Paroxysmal atrial fibrillation: Secondary | ICD-10-CM

## 2023-06-21 DIAGNOSIS — G4700 Insomnia, unspecified: Secondary | ICD-10-CM

## 2023-06-21 DIAGNOSIS — E039 Hypothyroidism, unspecified: Secondary | ICD-10-CM

## 2023-06-21 DIAGNOSIS — R531 Weakness: Secondary | ICD-10-CM

## 2023-06-21 DIAGNOSIS — I270 Primary pulmonary hypertension: Secondary | ICD-10-CM | POA: Diagnosis not present

## 2023-06-21 DIAGNOSIS — N183 Chronic kidney disease, stage 3 unspecified: Secondary | ICD-10-CM

## 2023-06-21 DIAGNOSIS — Z66 Do not resuscitate: Secondary | ICD-10-CM

## 2023-06-21 NOTE — Assessment & Plan Note (Signed)
 takes Levothyroxine, TSH 2.58 03/08/23

## 2023-06-21 NOTE — Assessment & Plan Note (Signed)
on ASA, Fish oil, Coronary artery bypass grafting 1992 

## 2023-06-21 NOTE — Assessment & Plan Note (Signed)
 takes Zolpidem.  ?

## 2023-06-21 NOTE — Assessment & Plan Note (Signed)
controlled, on Torsemide.  

## 2023-06-21 NOTE — Progress Notes (Signed)
 Location:  [facility] Room Number: 064-A Place of Service:  SNF (31) Provider:  Kingstin Heims X,NP  Abbas Beyene X, NP  Patient Care Team: Takyra Cantrall X, NP as PCP - General (Internal Medicine) Claudene Victory ORN, MD (Inactive) as PCP - Cardiology (Cardiology)  Extended Emergency Contact Information Primary Emergency Contact: Sessa,Jean Address: [address]          [place], [place] Home Phone: [phone] Relation: Spouse Secondary Emergency Contact: PRENTISS HANDING Address: [address]          [place], [place] [address] [place] Home Phone: [phone] Relation: Daughter  Code Status:  DNR Goals of care: Advanced Directive information    06/21/2023    2:41 PM  Advanced Directives  Does Patient Have a Medical Advance Directive? Yes  Type of Advance Directive Living will;Out of facility DNR (pink MOST or yellow form);Healthcare Power of Attorney  Does patient want to make changes to medical advance directive? No - Patient declined  Copy of Healthcare Power of Attorney in Chart? Yes - validated most recent copy scanned in chart (See row information)  Pre-existing out of facility DNR order (yellow form or pink MOST form) Pink MOST form placed in chart (order not valid for inpatient use);Yellow form placed in chart (order not valid for inpatient use)     Chief Complaint  Patient presents with   Acute Visit    Gereralized weakness    HPI:  Pt is a 89 y.o. male seen today for an acute visit for generalized weakness, decreased oral intake, otherwise the patient is in his usual mentation    Chronic blepharitis/lower eyelids ectropion, mild crusted eyelashes, patient declined ophthalmic ointment.               Afib, tachy brady, refused PPM. EKG 12/05/19 Afib at cardiology, only takes ASA             Thrombophilia, had work up in the past, venous US  negative DVT in legs.               Urinary frequency, no difference since off  [employer]abe[employer] due to cost, had urinary retention, s/p ED eval 11/22/20, f/u urology             CAD, on ASA, Fish oil, Coronary artery bypass grafting 1992             Hx of GI bleed, Hgb 14.2 12/02/22             CVA, slightly left sided weakness with muscle strength 5/5             HTN, controlled, on Torsemide.  Pulmonary hypertension/edema BLE, takes Torsemide, US  6/18/21negative DVT, last echo 2018 EF 55-60%, CXR 12/02/22 no acute findings.  Insomnia, takes Zolpidem              Hypothyroidism, takes Levothyroxine, TSH 2.58 03/08/23             CKD Bun/creat 30/1.9 06/07/23             Hyperlipidemia, LDL 107 05/05/20, on Omega 3 supplement             Constipation, takes Colace.                 Past Medical History:  Diagnosis Date   Atrial fibrillation (HCC)    CAD (coronary artery disease)    Diverticulosis    Hypercholesteremia    Hypertension  Hypothyroidism    Insomnia    Murmur    Paroxysmal atrial fibrillation (HCC)    Urinary dysfunction    Past Surgical History:  Procedure Laterality Date   BYPASS GRAFT     HEMORRHOID SURGERY     HERNIA REPAIR     HIP SURGERY      Allergies  Allergen Reactions   Cranberry Extract Cough   Shellfish Allergy Swelling and Rash    Outpatient Encounter Medications as of 06/21/2023  Medication Sig   Ascorbic Acid (VITAMIN C) 1000 MG tablet Take 1,000 mg by mouth in the morning and at bedtime.   aspirin  81 MG EC tablet in the morning and at bedtime.    cholecalciferol (VITAMIN D ) 1000 UNITS tablet Take 1,000 Units by mouth daily.   docusate sodium  (COLACE) 100 MG capsule Take 200 mg by mouth at bedtime.   levothyroxine (SYNTHROID) 50 MCG tablet Take 50 mcg by mouth daily before breakfast.   Multiple Vitamins-Minerals (PRESERVISION AREDS 2 PO) Take by mouth daily.   Multiple Vitamins-Minerals (THEREMS-M) TABS Take 1 tablet by mouth daily.   nystatin (MYCOSTATIN/NYSTOP) powder Apply 1 Application topically 2 (two) times daily. Apply to  (L) groin area topically two times a day related to RASH AND OTHER NONSPECIFIC SKIN ERUPTION   omega-3 acid ethyl esters (LOVAZA) 1 g capsule Take 1 g by mouth 2 (two) times daily.   Polyethyl Glycol-Propyl Glycol (SYSTANE) 0.4-0.3 % SOLN Place 2 drops into the left eye at bedtime.   Polyethylene Glycol 3350 (PEG 3350) 17 GM/SCOOP POWD Give 17gm one time daily.   potassium chloride (MICRO-K) 10 MEQ CR capsule Take 10 mEq by mouth daily.   saccharomyces boulardii (FLORASTOR) 250 MG capsule Take 250 mg by mouth daily.   torsemide (DEMADEX) 20 MG tablet Take 20 mg by mouth daily.   zolpidem  (AMBIEN ) 5 MG tablet Take 1 tablet (5 mg total) by mouth at bedtime. TAKE ONE TABLET AT BEDTIME   No facility-administered encounter medications on file as of 06/21/2023.    Review of Systems  Constitutional:  Positive for appetite change and fatigue. Negative for fever.  HENT:  Positive for hearing loss. Negative for congestion and trouble swallowing.   Respiratory:  Positive for shortness of breath. Negative for cough and wheezing.        Occasional nocturnal orthopnea at his baseline.   Cardiovascular:  Positive for leg swelling. Negative for chest pain and palpitations.  Gastrointestinal:  Negative for abdominal pain and constipation.  Genitourinary:  Positive for frequency. Negative for difficulty urinating and urgency.  Musculoskeletal:  Positive for arthralgias and gait problem.  Skin:  Negative for color change.  Neurological:  Negative for speech difficulty, weakness and headaches.  Psychiatric/Behavioral:  Negative for confusion and sleep disturbance. The patient is not nervous/anxious.     Immunization History  Administered Date(s) Administered   Fluad Quad(high Dose 65+) 03/16/2023   Influenza, High Dose Seasonal PF 04/11/2017, 03/26/2020   Influenza-Unspecified 03/26/2020, 04/01/2021, 04/12/2022   Moderna SARS-COV2 Booster Vaccination 04/06/2020   Moderna Sars-Covid-2 Vaccination  06/18/2019, 07/16/2019, 10/30/2021   PFIZER(Purple Top)SARS-COV-2 Vaccination 09/14/2020, 03/03/2021   Pfizer Covid-19 Vaccine Bivalent Booster 2yrs & up 04/15/2022   Pneumococcal-Unspecified 08/12/2012   Tetanus 06/14/2004   Zoster Recombinant(Shingrix) 02/03/2017, 06/02/2017   Pertinent  Health Maintenance Due  Topic Date Due   INFLUENZA VACCINE  Completed      06/22/2022   11:47 AM 07/20/2022   10:38 AM 09/07/2022   10:32 AM 10/18/2022  10:21 AM 12/17/2022    2:32 PM  Fall Risk  Falls in the past year? 0 0 1 1 1   Was there an injury with Fall? 0 0 0 0 1  Fall Risk Category Calculator 0 0 2 2 3   Fall Risk Category (Retired) Low      (RETIRED) Patient Fall Risk Level Low fall risk      Patient at Risk for Falls Due to History of fall(s) History of fall(s) History of fall(s);Impaired balance/gait;Impaired mobility History of fall(s);Impaired balance/gait History of fall(s);Impaired balance/gait;Impaired mobility  Fall risk Follow up Falls evaluation completed Falls evaluation completed Falls evaluation completed Falls evaluation completed Falls evaluation completed   Functional Status Survey:    Vitals:   06/21/23 1437 06/21/23 1440  BP: (!) 145/84 133/77  Pulse: 69   Resp: (!) 23   Temp: (!) 97.4 F (36.3 C)   SpO2: 98%   Weight: 155 lb 3.2 oz (70.4 kg)   Height: 5' 4 (1.626 m)    Body mass index is 26.64 kg/m. Physical Exam Vitals and nursing note reviewed.  Constitutional:      Comments: Generalized weakness, bed rest.   HENT:     Head: Normocephalic and atraumatic.     Mouth/Throat:     Mouth: Mucous membranes are moist.  Eyes:     Extraocular Movements: Extraocular movements intact.     Conjunctiva/sclera: Conjunctivae normal.     Pupils: Pupils are equal, round, and reactive to light.     Comments: R+L lower eyelid ectropion, mild crusted eyelashes.   Cardiovascular:     Rate and Rhythm: Bradycardia present. Rhythm irregular.     Heart sounds: Murmur heard.   Pulmonary:     Effort: Pulmonary effort is normal.     Breath sounds: Rales present.     Comments: Bibasilar rales.  Abdominal:     General: Bowel sounds are normal.     Palpations: Abdomen is soft.     Tenderness: There is abdominal tenderness.  Musculoskeletal:     Cervical back: Normal range of motion and neck supple.     Right lower leg: Edema present.     Left lower leg: Edema present.     Comments: 1+ edema BLE,  uses TED sometimes.  Skin:    General: Skin is warm and dry.     Comments: The right lower shin open area 2x3cm healing, no s/s of infection  Neurological:     General: No focal deficit present.     Mental Status: He is alert and oriented to person, place, and time. Mental status is at baseline.     Motor: Weakness present.     Coordination: Coordination normal.     Gait: Gait abnormal.     Comments: Left sided weakness form previous CVA, muscle strength 5/5  Psychiatric:        Mood and Affect: Mood normal.        Behavior: Behavior normal.        Thought Content: Thought content normal.        Judgment: Judgment normal.     Labs reviewed: Recent Labs    03/22/23 0000 05/31/23 0000 06/07/23 0000  NA 141 142 140  K 3.8 4.0 4.2  CL 99 104 101  CO2 31* 29* 31*  BUN 30* 30* 30*  CREATININE 1.6* 1.7* 1.9*  CALCIUM  9.0 8.8 9.2   Recent Labs    09/07/22 0000 12/03/22 0000  AST 17 18  ALT 13  11  ALKPHOS  --  65  ALBUMIN 3.4* 3.3*   Recent Labs    09/07/22 0000 12/02/22 0000  WBC 6.0 8.0  NEUTROABS 3,360.00 6,280.00  HGB 13.8 14.2  HCT 41 42  PLT 189 212   Lab Results  Component Value Date   TSH 2.58 03/08/2023   No results found for: HGBA1C Lab Results  Component Value Date   CHOL 183 05/05/2020   HDL 54 05/05/2020   LDLCALC 107 (H) 05/05/2020   TRIG 128 05/05/2020   CHOLHDL 3.4 05/05/2020    Significant Diagnostic Results in last 30 days:  No results found.  Assessment/Plan Generalized weakness generalized weakness,  decreased oral intake, otherwise the patient is in his usual mentation  Declined CBC/diff, CMP/eGFR, I+O Reduce Torsemide 20mg  every day/bid  Paroxysmal atrial fibrillation tachy brady, refused PPM. EKG 12/05/19 Afib at cardiology, only takes ASA  CAD (coronary artery disease) on ASA, Fish oil, Coronary artery bypass grafting 1992  Hypertension controlled, on Torsemide.   Pulmonary hypertension, primary (HCC) Pulmonary hypertension/edema BLE, takes Torsemide, US  6/18/21negative DVT, last echo 2018 EF 55-60%, CXR 12/02/22 no acute findings.   Insomnia  takes Zolpidem   Hypothyroidism takes Levothyroxine, TSH 2.58 03/08/23  CKD (chronic kidney disease) stage 3, GFR 30-59 ml/min (HCC) Bun/creat 30/1.9 06/07/23  Adult failure to thrive 06/23/23 comfort measures, declined Hospice, no further diagnostic testing, dc supplements, adding Atropine drops.      Family/ staff Communication: plan of care reviewed with the patient, the patient's daughter HPOA, and charge nurse.   Labs/tests ordered:  declined CBC/diff, CMP/eGFR  Time spend 30 minutes.

## 2023-06-21 NOTE — Assessment & Plan Note (Signed)
 tachy brady, refused PPM. EKG 12/05/19 Afib at cardiology, only takes ASA

## 2023-06-21 NOTE — Assessment & Plan Note (Signed)
 generalized weakness, decreased oral intake, otherwise the patient is in his usual mentation  Declined CBC/diff, CMP/eGFR, I+O Reduce Torsemide 20mg  every day/bid

## 2023-06-21 NOTE — Assessment & Plan Note (Signed)
 Bun/creat 30/1.9 06/07/23

## 2023-06-21 NOTE — Assessment & Plan Note (Signed)
Pulmonary hypertension/edema BLE, takes Torsemide, [place] 6/18/21negative DVT, last echo 2018 EF 55-60%, CXR 12/02/22 no acute findings.

## 2023-06-23 ENCOUNTER — Encounter: Payer: Self-pay | Admitting: Nurse Practitioner

## 2023-06-23 DIAGNOSIS — R627 Adult failure to thrive: Secondary | ICD-10-CM | POA: Insufficient documentation

## 2023-06-23 NOTE — Assessment & Plan Note (Signed)
 06/23/23 comfort measures, declined Hospice, no further diagnostic testing, dc supplements, adding Atropine drops.

## 2023-07-11 ENCOUNTER — Non-Acute Institutional Stay (SKILLED_NURSING_FACILITY): Payer: Medicare PPO | Admitting: Nurse Practitioner

## 2023-07-11 ENCOUNTER — Encounter: Payer: Self-pay | Admitting: Nurse Practitioner

## 2023-07-11 DIAGNOSIS — E785 Hyperlipidemia, unspecified: Secondary | ICD-10-CM

## 2023-07-11 DIAGNOSIS — G4700 Insomnia, unspecified: Secondary | ICD-10-CM | POA: Diagnosis not present

## 2023-07-11 DIAGNOSIS — N183 Chronic kidney disease, stage 3 unspecified: Secondary | ICD-10-CM | POA: Diagnosis not present

## 2023-07-11 DIAGNOSIS — I480 Paroxysmal atrial fibrillation: Secondary | ICD-10-CM

## 2023-07-11 DIAGNOSIS — K5901 Slow transit constipation: Secondary | ICD-10-CM

## 2023-07-11 DIAGNOSIS — E039 Hypothyroidism, unspecified: Secondary | ICD-10-CM | POA: Diagnosis not present

## 2023-07-11 DIAGNOSIS — I6300 Cerebral infarction due to thrombosis of unspecified precerebral artery: Secondary | ICD-10-CM

## 2023-07-11 DIAGNOSIS — I270 Primary pulmonary hypertension: Secondary | ICD-10-CM | POA: Diagnosis not present

## 2023-07-11 NOTE — Assessment & Plan Note (Signed)
Bun/creat 30/1.9 06/07/23, decease Torsemide wight help Bun/creat.

## 2023-07-11 NOTE — Assessment & Plan Note (Signed)
LDL 107 05/05/20, on Omega 3 supplement

## 2023-07-11 NOTE — Assessment & Plan Note (Signed)
Pulmonary hypertension/edema BLE, much less edema than prior, seeing on top of feet now, will decrease Torsemide 10mg  20mg  qd, [place] 6/18/21negative DVT, last echo 2018 EF 55-60%, CXR 12/02/22 no acute findings.

## 2023-07-11 NOTE — Assessment & Plan Note (Signed)
tachy brady, refused PPM. EKG 12/05/19 Afib at cardiology, only takes ASA

## 2023-07-11 NOTE — Assessment & Plan Note (Signed)
Stable, takes Colace.

## 2023-07-11 NOTE — Assessment & Plan Note (Signed)
slightly left sided weakness with muscle strength 5/5

## 2023-07-11 NOTE — Assessment & Plan Note (Signed)
takes Zolpidem.  ?

## 2023-07-11 NOTE — Assessment & Plan Note (Signed)
takes Levothyroxine, TSH 2.58 03/08/23

## 2023-07-11 NOTE — Progress Notes (Unsigned)
Location:   SNF [facility] Room Number: 32 Place of Service:  SNF (31) Provider: Arna Snipe Jakyra Kenealy NP  Marilu Rylander X, NP  Patient Care Team: Keven Soucy X, NP as PCP - General (Internal Medicine) Lyn Records, MD (Inactive) as PCP - Cardiology (Cardiology)  Extended Emergency Contact Information Primary Emergency Contact: Harewood,Jean Address: [address]          [place], [place] Home Phone: [phone] Relation: Spouse Secondary Emergency Contact: Lorretta Harp Address: [address]          [place], [place] [address] [place] Home Phone: [phone] Relation: Daughter  Code Status:  DNR Goals of care: Advanced Directive information    06/21/2023    2:41 PM  Advanced Directives  Does Patient Have a Medical Advance Directive? Yes  Type of Advance Directive Living will;Out of facility DNR (pink MOST or yellow form);Healthcare Power of Attorney  Does patient want to make changes to medical advance directive? No - Patient declined  Copy of Healthcare Power of Attorney in Chart? Yes - validated most recent copy scanned in chart (See row information)  Pre-existing out of facility DNR order (yellow form or pink MOST form) Pink MOST form placed in chart (order not valid for inpatient use);Yellow form placed in chart (order not valid for inpatient use)     Chief Complaint  Patient presents with  . Medical Management of Chronic Issues    HPI:  Pt is a 89 y.o. male seen today for medical management of chronic diseases.     Chronic blepharitis/lower eyelids ectropion, mild crusted eyelashes, patient declined ophthalmic ointment.               Afib, tachy brady, refused PPM. EKG 12/05/19 Afib at cardiology, only takes ASA             Thrombophilia, had work up in the past, venous US negative DVT in legs.               Urinary frequency, no difference since off Mirabegran due to cost, had urinary retention, s/p ED eval 11/22/20, f/u urology              CAD, on ASA, Fish oil, Coronary artery bypass grafting 1992             Hx of GI bleed, Hgb 14.2 12/02/22             CVA, slightly left sided weakness with muscle strength 5/5             HTN, controlled, on Torsemide.  Pulmonary hypertension/edema BLE, takes Torsemide, [place] 6/18/21negative DVT, last echo 2018 EF 55-60%, CXR 12/02/22 no acute findings.  Insomnia, takes Zolpidem             Hypothyroidism, takes Levothyroxine, TSH 2.58 03/08/23             CKD Bun/creat 30/1.9 06/07/23             Hyperlipidemia, LDL 107 05/05/20, on Omega 3 supplement             Constipation, takes Colace.                   Past Medical History:  Diagnosis Date  . Atrial fibrillation (HCC)   . CAD (coronary artery disease)   . Diverticulosis   . Hypercholesteremia   . Hypertension   . Hypothyroidism   . Insomnia   . Murmur   .  Paroxysmal atrial fibrillation (HCC)   . Urinary dysfunction    Past Surgical History:  Procedure Laterality Date  . BYPASS GRAFT    . HEMORRHOID SURGERY    . HERNIA REPAIR    . HIP SURGERY      Allergies  Allergen Reactions  . Cranberry Extract Cough  . Shellfish Allergy Swelling and Rash    Allergies as of 07/11/2023       Reactions   Cranberry Extract Cough   Shellfish Allergy Swelling, Rash        Medication List        Accurate as of July 11, 2023 11:59 PM. If you have any questions, ask your nurse or doctor.          aspirin EC 81 MG tablet in the morning and at bedtime.   cholecalciferol 1000 units tablet Commonly known as: VITAMIN D Take 1,000 Units by mouth daily.   docusate sodium 100 MG capsule Commonly known as: COLACE Take 200 mg by mouth at bedtime.   levothyroxine 50 MCG tablet Commonly known as: SYNTHROID Take 50 mcg by mouth daily before breakfast.   nystatin powder Commonly known as: MYCOSTATIN/NYSTOP Apply 1 Application topically 2 (two) times daily. Apply to (L) groin area topically two times a day related to  RASH AND OTHER NONSPECIFIC SKIN ERUPTION   omega-3 acid ethyl esters 1 g capsule Commonly known as: LOVAZA Take 1 g by mouth 2 (two) times daily.   PEG 3350 17 GM/SCOOP Powd Give 17gm one time daily.   potassium chloride 10 MEQ CR capsule Commonly known as: MICRO-K Take 10 mEq by mouth daily.   PRESERVISION AREDS 2 PO Take by mouth daily.   Therems-M Tabs Take 1 tablet by mouth daily.   saccharomyces boulardii 250 MG capsule Commonly known as: FLORASTOR Take 250 mg by mouth daily.   Systane 0.4-0.3 % Soln Generic drug: Polyethyl Glycol-Propyl Glycol Place 2 drops into the left eye at bedtime.   torsemide 20 MG tablet Commonly known as: DEMADEX Take 20 mg by mouth daily.   vitamin C 1000 MG tablet Take 1,000 mg by mouth in the morning and at bedtime.   zolpidem 5 MG tablet Commonly known as: AMBIEN Take 1 tablet (5 mg total) by mouth at bedtime. TAKE ONE TABLET AT BEDTIME        Review of Systems  Constitutional:  Positive for appetite change and fatigue. Negative for fever.  HENT:  Positive for hearing loss. Negative for congestion and trouble swallowing.   Respiratory:  Positive for shortness of breath. Negative for cough and wheezing.        Occasional nocturnal orthopnea at his baseline.   Cardiovascular:  Positive for leg swelling. Negative for chest pain and palpitations.  Gastrointestinal:  Negative for abdominal pain and constipation.  Genitourinary:  Positive for frequency. Negative for difficulty urinating and urgency.  Musculoskeletal:  Positive for arthralgias and gait problem.  Skin:  Negative for color change.  Neurological:  Negative for speech difficulty, weakness and headaches.  Psychiatric/Behavioral:  Negative for confusion and sleep disturbance. The patient is not nervous/anxious.     Immunization History  Administered Date(s) Administered  . Fluad Quad(high Dose 65+) 03/16/2023  . Influenza, High Dose Seasonal PF 04/11/2017, 03/26/2020   . Influenza-Unspecified 03/26/2020, 04/01/2021, 04/12/2022  . Moderna SARS-COV2 Booster Vaccination 04/06/2020  . Moderna Sars-Covid-2 Vaccination 06/18/2019, 07/16/2019, 10/30/2021  . PFIZER(Purple Top)SARS-COV-2 Vaccination 09/14/2020, 03/03/2021  . [employer], [employer] 89yrs & up 04/15/2022  .  Pneumococcal-Unspecified 08/12/2012  . Tetanus 06/14/2004  . Zoster Recombinant(Shingrix) 02/03/2017, 06/02/2017   Pertinent  Health Maintenance Due  Topic Date Due  . INFLUENZA VACCINE  Completed      06/22/2022   11:47 AM 07/20/2022   10:38 AM 09/07/2022   10:32 AM 10/18/2022   10:21 AM 12/17/2022    2:32 PM  Fall Risk  Falls in the past year? 0 0 1 1 1   Was there an injury with Fall? 0 0 0 0 1  Fall Risk Category Calculator 0 0 2 2 3   Fall Risk Category (Retired) Low      (RETIRED) Patient Fall Risk Level Low fall risk      Patient at Risk for Falls Due to History of fall(s) History of fall(s) History of fall(s);Impaired balance/gait;Impaired mobility History of fall(s);Impaired balance/gait History of fall(s);Impaired balance/gait;Impaired mobility  Fall risk Follow up Falls evaluation completed Falls evaluation completed Falls evaluation completed Falls evaluation completed Falls evaluation completed   Functional Status Survey:    Vitals:   07/11/23 1547  BP: 114/65  Pulse: 60  Resp: 15  Temp: (!) 97.2 F (36.2 C)  SpO2: 91%  Weight: 154 lb (69.9 kg)   Body mass index is 26.43 kg/m. Physical Exam Vitals and nursing note reviewed.  Constitutional:      Comments: Generalized weakness, bed rest.   HENT:     Head: Normocephalic and atraumatic.     Mouth/Throat:     Mouth: Mucous membranes are moist.  Eyes:     Extraocular Movements: Extraocular movements intact.     Conjunctiva/sclera: Conjunctivae normal.     Pupils: Pupils are equal, round, and reactive to light.     Comments: R+L lower eyelid ectropion, mild crusted eyelashes.   Cardiovascular:      Rate and Rhythm: Bradycardia present. Rhythm irregular.     Heart sounds: Murmur heard.  Pulmonary:     Effort: Pulmonary effort is normal.     Breath sounds: Rales present.     Comments: Bibasilar rales.  Abdominal:     General: Bowel sounds are normal.     Palpations: Abdomen is soft.     Tenderness: There is abdominal tenderness.  Musculoskeletal:     Cervical back: Normal range of motion and neck supple.     Right lower leg: Edema present.     Left lower leg: Edema present.     Comments: 1+ edema top of R+L foot,  uses TED sometimes.  Skin:    General: Skin is warm and dry.     Comments: The right lower shin open area 2x3cm healing, no s/s of infection  Neurological:     General: No focal deficit present.     Mental Status: He is alert and oriented to person, place, and time. Mental status is at baseline.     Motor: Weakness present.     Coordination: Coordination normal.     Gait: Gait abnormal.     Comments: Left sided weakness form previous CVA, muscle strength 5/5  Psychiatric:        Mood and Affect: Mood normal.        Behavior: Behavior normal.        Thought Content: Thought content normal.        Judgment: Judgment normal.    Labs reviewed: Recent Labs    03/22/23 0000 05/31/23 0000 06/07/23 0000  NA 141 142 140  K 3.8 4.0 4.2  CL 99 104 101  CO2 31*  29* 31*  BUN 30* 30* 30*  CREATININE 1.6* 1.7* 1.9*  CALCIUM 9.0 8.8 9.2   Recent Labs    09/07/22 0000 12/03/22 0000  AST 17 18  ALT 13 11  ALKPHOS  --  65  ALBUMIN 3.4* 3.3*   Recent Labs    09/07/22 0000 12/02/22 0000  WBC 6.0 8.0  NEUTROABS 3,360.00 6,280.00  HGB 13.8 14.2  HCT 41 42  PLT 189 212   Lab Results  Component Value Date   TSH 2.58 03/08/2023   No results found for: "HGBA1C" Lab Results  Component Value Date   CHOL 183 05/05/2020   HDL 54 05/05/2020   LDLCALC 107 (H) 05/05/2020   TRIG 128 05/05/2020   CHOLHDL 3.4 05/05/2020    Significant Diagnostic Results in last  30 days:  No results found.  Assessment/Plan  Pulmonary hypertension, primary (HCC) Pulmonary hypertension/edema BLE, much less edema than prior, seeing on top of feet now, will decrease Torsemide 10mg  20mg  qd, [place] 6/18/21negative DVT, last echo 2018 EF 55-60%, CXR 12/02/22 no acute findings.   Insomnia  takes Zolpidem  Hypothyroidism takes Levothyroxine, TSH 2.58 03/08/23  CKD (chronic kidney disease) stage 3, GFR 30-59 ml/min (HCC) Bun/creat 30/1.9 Jun 23, 2023, decease Torsemide wight help Bun/creat.   Hyperlipidemia  LDL 107 05/05/20, on Omega 3 supplement  Slow transit constipation Stable, takes Colace.   CVA (cerebral vascular accident) (HCC) slightly left sided weakness with muscle strength 5/5  Paroxysmal atrial fibrillation tachy brady, refused PPM. EKG 12/05/19 Afib at cardiology, only takes ASA   Family/ staff Communication: plan of care reviewed with the patient and charge nurse.   Labs/tests ordered:  none  Time spend 30 minutes.

## 2023-07-12 ENCOUNTER — Non-Acute Institutional Stay: Payer: Medicare PPO | Admitting: Nurse Practitioner

## 2023-07-12 ENCOUNTER — Encounter: Payer: Self-pay | Admitting: Nurse Practitioner

## 2023-07-12 DIAGNOSIS — Z66 Do not resuscitate: Secondary | ICD-10-CM

## 2023-07-12 DIAGNOSIS — Z0000 Encounter for general adult medical examination without abnormal findings: Secondary | ICD-10-CM

## 2023-07-13 ENCOUNTER — Encounter: Payer: Self-pay | Admitting: Nurse Practitioner

## 2023-07-13 NOTE — Progress Notes (Signed)
Subjective:   Billy Barnes is a 89 y.o. male who presents for Medicare Annual/Subsequent preventive examination.  Visit Complete: In person  Patient Medicare AWV questionnaire was completed by the patient on 07/12/23; I have confirmed that all information answered by patient is correct and no changes since this date.        Objective:    Today's Vitals   07/12/23 1537  BP: 136/73  Pulse: 60  Temp: (!) 97.1 F (36.2 C)  SpO2: 95%  Weight: 154 lb (69.9 kg)  Height: 5\' 4"  (1.626 m)  PainSc: 0-No pain   Body mass index is 26.43 kg/m.     07/12/2023    3:38 PM 06/21/2023    2:41 PM 04/29/2023   10:57 AM 03/30/2023    1:09 PM 03/03/2023   12:23 PM 01/27/2023    3:11 PM 01/11/2023   11:51 AM  Advanced Directives  Does Patient Have a Medical Advance Directive? Yes Yes Yes Yes Yes Yes Yes  Type of Advance Directive Living will;Out of facility DNR (pink MOST or yellow form);Healthcare Power of Attorney Living will;Out of facility DNR (pink MOST or yellow form);Healthcare Power of Attorney Living will;Out of facility DNR (pink MOST or yellow form) Living will;Out of facility DNR (pink MOST or yellow form) Living will;Out of facility DNR (pink MOST or yellow form) Living will;Out of facility DNR (pink MOST or yellow form) Living will;Out of facility DNR (pink MOST or yellow form)  Does patient want to make changes to medical advance directive? No - Patient declined No - Patient declined No - Patient declined No - Patient declined No - Patient declined No - Patient declined No - Patient declined  Copy of Healthcare Power of Attorney in Chart? Yes - validated most recent copy scanned in chart (See row information) Yes - validated most recent copy scanned in chart (See row information)       Pre-existing out of facility DNR order (yellow form or pink MOST form) Pink MOST form placed in chart (order not valid for inpatient use);Yellow form placed in chart (order not valid for inpatient use)  Pink MOST form placed in chart (order not valid for inpatient use);Yellow form placed in chart (order not valid for inpatient use) Pink MOST form placed in chart (order not valid for inpatient use);Yellow form placed in chart (order not valid for inpatient use) Pink MOST form placed in chart (order not valid for inpatient use);Yellow form placed in chart (order not valid for inpatient use) Pink MOST form placed in chart (order not valid for inpatient use);Yellow form placed in chart (order not valid for inpatient use) Pink MOST form placed in chart (order not valid for inpatient use);Yellow form placed in chart (order not valid for inpatient use) Pink MOST form placed in chart (order not valid for inpatient use);Yellow form placed in chart (order not valid for inpatient use)    Current Medications (verified) Outpatient Encounter Medications as of 07/12/2023  Medication Sig   acetaminophen (TYLENOL) 325 MG tablet Take 650 mg by mouth every 6 (six) hours as needed for mild pain (pain score 1-3) or fever (NOT TO EXCEED 3, 000 MG IN 24 HOURS).   acetaminophen (TYLENOL) 650 MG suppository Place 650 mg rectally every 4 (four) hours as needed.   aspirin 81 MG EC tablet in the morning and at bedtime.    atropine 1 % ophthalmic solution Place 2 drops under the tongue every 2 (two) hours as needed.   docusate  sodium (COLACE) 100 MG capsule Take 200 mg by mouth at bedtime.   levothyroxine (SYNTHROID) 50 MCG tablet Take 50 mcg by mouth daily before breakfast.   nystatin (MYCOSTATIN/NYSTOP) powder Apply 1 Application topically 2 (two) times daily. Apply to (L) groin area topically two times a day related to RASH AND OTHER NONSPECIFIC SKIN ERUPTION   Polyethyl Glycol-Propyl Glycol (SYSTANE) 0.4-0.3 % SOLN Place 2 drops into the left eye at bedtime.   potassium chloride (MICRO-K) 10 MEQ CR capsule Take 10 mEq by mouth daily.   torsemide (DEMADEX) 20 MG tablet Take 20 mg by mouth daily.   UNABLE TO FIND Med Name:  Magic Cup two times a day   zolpidem (AMBIEN) 5 MG tablet Take 1 tablet (5 mg total) by mouth at bedtime. TAKE ONE TABLET AT BEDTIME   Ascorbic Acid (VITAMIN C) 1000 MG tablet Take 1,000 mg by mouth in the morning and at bedtime. (Patient not taking: Reported on 07/12/2023)   cholecalciferol (VITAMIN D) 1000 UNITS tablet Take 1,000 Units by mouth daily. (Patient not taking: Reported on 07/12/2023)   Multiple Vitamins-Minerals (PRESERVISION AREDS 2 PO) Take by mouth daily. (Patient not taking: Reported on 07/12/2023)   Multiple Vitamins-Minerals (THEREMS-M) TABS Take 1 tablet by mouth daily. (Patient not taking: Reported on 07/12/2023)   omega-3 acid ethyl esters (LOVAZA) 1 g capsule Take 1 g by mouth 2 (two) times daily. (Patient not taking: Reported on 07/12/2023)   Polyethylene Glycol 3350 (PEG 3350) 17 GM/SCOOP POWD Give 17gm one time daily. (Patient not taking: Reported on 07/12/2023)   saccharomyces boulardii (FLORASTOR) 250 MG capsule Take 250 mg by mouth daily. (Patient not taking: Reported on 07/12/2023)   No facility-administered encounter medications on file as of 07/12/2023.    Allergies (verified) Cranberry extract and Shellfish allergy   History: Past Medical History:  Diagnosis Date   Atrial fibrillation (HCC)    CAD (coronary artery disease)    Diverticulosis    Hypercholesteremia    Hypertension    Hypothyroidism    Insomnia    Murmur    Paroxysmal atrial fibrillation (HCC)    Urinary dysfunction    Past Surgical History:  Procedure Laterality Date   BYPASS GRAFT     HEMORRHOID SURGERY     HERNIA REPAIR     HIP SURGERY     Family History  Problem Relation Age of Onset   Heart disease Mother    Cancer Father    ALS Brother    Heart disease Daughter    Social History   Socioeconomic History   Marital status: Married    Spouse name: Not on file   Number of children: Not on file   Years of education: Not on file   Highest education level: Not on file   Occupational History   Not on file  Tobacco Use   Smoking status: Former   Smokeless tobacco: Never  Vaping Use   Vaping status: Never Used  Substance and Sexual Activity   Alcohol use: Yes    Comment: occasional   Drug use: No   Sexual activity: Not Currently  Other Topics Concern   Not on file  Social History Narrative   Social History     Tobacco Use       Smoking status: None       Smokeless tobacco: Never Used     Alcohol use: No       Alcohol/week: 0.0 standard drinks     Drug use: No  Diet is good   No caffeine   Married since 1952   Lives in apartment, one story   Lives with full time home health   Pet- Cat (Tai)   Highest level of education: PHD   Past profession- University professor   Some exercise-upper arm      Living Will-Yes   DNR-Yes   POA/HPOA- Yes      Difficulty bathing, dressing, preparing, eating, managing medications, and managing finances.   No difficulty affording medications   Social Drivers of Corporate investment banker Strain: Not on file  Food Insecurity: Not on file  Transportation Needs: Not on file  Physical Activity: Not on file  Stress: Not on file  Social Connections: Not on file    Tobacco Counseling Counseling given: Not Answered   Clinical Intake:  Pre-visit preparation completed: Yes  Pain : No/denies pain Pain Score: 0-No pain     BMI - recorded: 26.43 Nutritional Status: BMI 25 -29 Overweight Nutritional Risks: None Diabetes: No  How often do you need to have someone help you when you read instructions, pamphlets, or other written materials from your doctor or pharmacy?: 4 - Often What is the last grade level you completed in school?: college  Interpreter Needed?: No  Information entered by :: Ravon Mcilhenny Nedra Hai NP   Activities of Daily Living    07/13/2023    2:10 PM  In your present state of health, do you have any difficulty performing the following activities:  Hearing? 1  Vision? 0  Difficulty  concentrating or making decisions? 1  Walking or climbing stairs? 1  Dressing or bathing? 1  Doing errands, shopping? 1    Patient Care Team: Satori Krabill X, NP as PCP - General (Internal Medicine) Lyn Records, MD (Inactive) as PCP - Cardiology (Cardiology)  Indicate any recent Medical Services you may have received from other than [facility] providers in the past year (date may be approximate).     Assessment:   This is a routine wellness examination for Jasiah.  Hearing/Vision screen No results found.   Goals Addressed             This Visit's Progress    Comfort Maintained       Evidence-based guidance:  Screen for presence of fatigue using a quantitative (1 to 10) or a word scale (mild, moderate, severe); include onset, pattern, duration, any change over time, alleviating and contributing factors including reversible   causes, and degree of interference with daily function.  Acknowledge and validate presence of fatigue and impact on self-care, activities, and relationships.  Promote fatigue management techniques that may include use of a fatigue diary to help determine peak energy levels, balancing activity with rest, healthy diet, and managing anemia.  Promote sleep hygiene techniques that include cool room temperature, darkened bedroom, use of white noise, use of active (with mediation or relaxation) or passive listening to soft, soothing music, cognitive behavior therapy;   provide strategies to reduce itching from pruritus.  In presence of pruritus, encourage use of gentle soap and emollient, warm (not hot) shower or bath, careful application of ice/cool pack; discourage scratching.  Assess, acknowledge, validate effects of disease on relationship(s) including intimacy, sexuality.  Discuss pregnancy, family planning, contraception with women of childbearing age; consider referrals as appropriate to perinatology, gynecologist.   Notes:        Depression Screen     09/07/2022   10:28 AM 07/20/2022   10:38 AM 04/02/2022  10:17 AM  PHQ 2/9 Scores  PHQ - 2 Score 0 0 0    Fall Risk    12/17/2022    2:32 PM 10/18/2022   10:21 AM 09/07/2022   10:32 AM 07/20/2022   10:38 AM 06/22/2022   11:47 AM  Fall Risk   Falls in the past year? 1 1 1  0 0  Number falls in past yr: 1 1 1  0 0  Injury with Fall? 1 0 0 0 0  Risk for fall due to : History of fall(s);Impaired balance/gait;Impaired mobility History of fall(s);Impaired balance/gait History of fall(s);Impaired balance/gait;Impaired mobility History of fall(s) History of fall(s)  Follow up Falls evaluation completed Falls evaluation completed Falls evaluation completed Falls evaluation completed Falls evaluation completed    MEDICARE RISK AT HOME: Medicare Risk at Home Any stairs in or around the home?: Yes If so, are there any without handrails?: No Home free of loose throw rugs in walkways, pet beds, electrical cords, etc?: Yes Adequate lighting in your home to reduce risk of falls?: Yes Life alert?: No Use of a cane, walker or w/c?: Yes Grab bars in the bathroom?: Yes Shower chair or bench in shower?: Yes Elevated toilet seat or a handicapped toilet?: Yes  TIMED UP AND GO:  Was the test performed?  No    Cognitive Function:        Immunizations Immunization History  Administered Date(s) Administered   Fluad Quad(high Dose 65+) 03/16/2023   Influenza, High Dose Seasonal PF 04/11/2017, 03/26/2020   Influenza-Unspecified 03/26/2020, 04/01/2021, 04/12/2022   Moderna SARS-COV2 Booster Vaccination 04/06/2020   Moderna Sars-Covid-2 Vaccination 06/18/2019, 07/16/2019, 10/30/2021   PFIZER(Purple Top)SARS-COV-2 Vaccination 09/14/2020, 03/03/2021   Pfizer Covid-19 Vaccine Bivalent Booster 42yrs & up 04/15/2022   Pneumococcal-Unspecified 08/12/2012   Tetanus 06/14/2004   Zoster Recombinant(Shingrix) 02/03/2017, 06/02/2017    TDAP status: Up to date  Flu Vaccine status: Up to date  Pneumococcal  vaccine status: Up to date  Covid-19 vaccine status: Completed vaccines  Qualifies for Shingles Vaccine? Yes   Zostavax completed Yes   Shingrix Completed?: Yes  Screening Tests Health Maintenance  Topic Date Due   COVID-19 Vaccine (8 - 2024-25 season) 02/13/2023   Medicare Annual Wellness (AWV)  07/11/2024   Pneumonia Vaccine 53+ Years old  Completed   INFLUENZA VACCINE  Completed   Zoster Vaccines- Shingrix  Completed   HPV VACCINES  Aged Out   DTaP/Tdap/Td  Discontinued    Health Maintenance  Health Maintenance Due  Topic Date Due   COVID-19 Vaccine (8 - 2024-25 season) 02/13/2023    Colorectal cancer screening: No longer required.   Lung Cancer Screening: (Low Dose CT Chest recommended if Age 73-80 years, 20 pack-year currently smoking OR have quit w/in 15years.) does not qualify.   Lung Cancer Screening Referral: NA  Additional Screening:  Hepatitis C Screening: does not qualify;   Vision Screening: Recommended annual ophthalmology exams for early detection of glaucoma and other disorders of the eye. Is the patient up to date with their annual eye exam?  No  Who is the provider or what is the name of the office in which the patient attends annual eye exams? HPOA will provided if needed.  If pt is not established with a provider, would they like to be referred to a provider to establish care? No .   Dental Screening: Recommended annual dental exams for proper oral hygiene  Diabetic Foot Exam: NA  Community Resource Referral / Chronic Care Management: CRR required this  visit?  No   CCM required this visit?  No     Plan:     I have personally reviewed and noted the following in the patient's chart:   Medical and social history Use of alcohol, tobacco or illicit drugs  Current medications and supplements including opioid prescriptions. Patient is not currently taking opioid prescriptions. Functional ability and status Nutritional status Physical  activity Advanced directives List of other physicians Hospitalizations, surgeries, and ER visits in previous 12 months Vitals Screenings to include cognitive, depression, and falls Referrals and appointments  In addition, I have reviewed and discussed with patient certain preventive protocols, quality metrics, and best practice recommendations. A written personalized care plan for preventive services as well as general preventive health recommendations were provided to patient.     Darielys Giglia X Kaedynce Tapp, NP   07/13/2023   After Visit Summary: (In Person-Declined) Patient declined AVS at this time.

## 2023-07-26 ENCOUNTER — Non-Acute Institutional Stay (SKILLED_NURSING_FACILITY): Payer: Self-pay | Admitting: Nurse Practitioner

## 2023-07-26 ENCOUNTER — Encounter: Payer: Self-pay | Admitting: Nurse Practitioner

## 2023-07-26 DIAGNOSIS — I270 Primary pulmonary hypertension: Secondary | ICD-10-CM

## 2023-07-26 DIAGNOSIS — G4700 Insomnia, unspecified: Secondary | ICD-10-CM

## 2023-07-26 DIAGNOSIS — N183 Chronic kidney disease, stage 3 unspecified: Secondary | ICD-10-CM

## 2023-07-26 DIAGNOSIS — K5901 Slow transit constipation: Secondary | ICD-10-CM

## 2023-07-26 DIAGNOSIS — I2510 Atherosclerotic heart disease of native coronary artery without angina pectoris: Secondary | ICD-10-CM

## 2023-07-26 DIAGNOSIS — E039 Hypothyroidism, unspecified: Secondary | ICD-10-CM | POA: Diagnosis not present

## 2023-07-26 DIAGNOSIS — I6300 Cerebral infarction due to thrombosis of unspecified precerebral artery: Secondary | ICD-10-CM

## 2023-07-26 DIAGNOSIS — I480 Paroxysmal atrial fibrillation: Secondary | ICD-10-CM

## 2023-07-26 NOTE — Assessment & Plan Note (Signed)
No chest pain reported, on ASA, Coronary artery bypass grafting 1992

## 2023-07-26 NOTE — Assessment & Plan Note (Signed)
Blood pressure is controlled Pulmonary hypertension/edema BLE minimal, reduce Torsemide every other day [place] 6/18/21negative DVT, last echo 2018 EF 55-60%, CXR 12/02/22 no acute findings.

## 2023-07-26 NOTE — Assessment & Plan Note (Signed)
takes Levothyroxine, TSH 2.58 03/08/23

## 2023-07-26 NOTE — Assessment & Plan Note (Signed)
At his baseline tachy brady, refused PPM. EKG 12/05/19 Afib at cardiology, only takes ASA

## 2023-07-26 NOTE — Progress Notes (Signed)
Location:   SNF [facility] Room Number: 38 Place of Service:  SNF (31) Provider: Arna Snipe Ilsa Bonello NP  Janica Eldred X, NP  Patient Care Team: Burhanuddin Kohlmann X, NP as PCP - General (Internal Medicine) Lyn Records, MD (Inactive) as PCP - Cardiology (Cardiology)  Extended Emergency Contact Information Primary Emergency Contact: Trefry,Jean Address: [address]          [place], [place] Home Phone: [phone] Relation: Spouse Secondary Emergency Contact: Lorretta Harp Address: [address]          [place], [place] [address] [place] Home Phone: [phone] Relation: Daughter  Code Status:  DNR Goals of care: Advanced Directive information    07/12/2023    3:38 PM  Advanced Directives  Does Patient Have a Medical Advance Directive? Yes  Type of Advance Directive Living will;Out of facility DNR (pink MOST or yellow form);Healthcare Power of Attorney  Does patient want to make changes to medical advance directive? No - Patient declined  Copy of Healthcare Power of Attorney in Chart? Yes - validated most recent copy scanned in chart (See row information)  Pre-existing out of facility DNR order (yellow form or pink MOST form) Pink MOST form placed in chart (order not valid for inpatient use);Yellow form placed in chart (order not valid for inpatient use)     Chief Complaint  Patient presents with   Medical Management of Chronic Issues    HPI:  Pt is a 89 y.o. male seen today for medical management of chronic diseases.      Chronic blepharitis/lower eyelids ectropion, mild crusted eyelashes, patient declined ophthalmic ointment.               Afib, tachy brady, refused PPM. EKG 12/05/19 Afib at cardiology, only takes ASA             Thrombophilia, had work up in the past, venous US negative DVT in legs.               Urinary frequency, no difference since off Mirabegran due to cost, had urinary retention, s/p ED eval 11/22/20, f/u urology              CAD, on ASA, Coronary artery bypass grafting 1992             Hx of GI bleed, Hgb 14.2 12/02/22             CVA, slightly left sided weakness with muscle strength 5/5             HTN, controlled, on Torsemide.  Pulmonary hypertension/edema BLE, takes Torsemide, [place] 6/18/21negative DVT, last echo 2018 EF 55-60%, CXR 12/02/22 no acute findings.  Insomnia, takes Zolpidem             Hypothyroidism, takes Levothyroxine, TSH 2.58 03/08/23             CKD Bun/creat 30/1.9 06/07/23             Hyperlipidemia, LDL 107 05/05/20             Constipation, takes Colace.                Past Medical History:  Diagnosis Date   Atrial fibrillation (HCC)    CAD (coronary artery disease)    Diverticulosis    Hypercholesteremia    Hypertension    Hypothyroidism    Insomnia    Murmur    Paroxysmal atrial fibrillation (HCC)  Urinary dysfunction    Past Surgical History:  Procedure Laterality Date   BYPASS GRAFT     HEMORRHOID SURGERY     HERNIA REPAIR     HIP SURGERY      Allergies  Allergen Reactions   Cranberry Extract Cough   Shellfish Allergy Swelling and Rash    Allergies as of 07/26/2023       Reactions   Cranberry Extract Cough   Shellfish Allergy Swelling, Rash        Medication List        Accurate as of July 26, 2023  3:53 PM. If you have any questions, ask your nurse or doctor.          acetaminophen 650 MG suppository Commonly known as: TYLENOL Place 650 mg rectally every 4 (four) hours as needed.   acetaminophen 325 MG tablet Commonly known as: TYLENOL Take 650 mg by mouth every 6 (six) hours as needed for mild pain (pain score 1-3) or fever (NOT TO EXCEED 3, 000 MG IN 24 HOURS).   aspirin EC 81 MG tablet in the morning and at bedtime.   atropine 1 % ophthalmic solution Place 2 drops under the tongue every 2 (two) hours as needed.   cholecalciferol 1000 units tablet Commonly known as: VITAMIN D Take 1,000 Units by mouth daily.   docusate  sodium 100 MG capsule Commonly known as: COLACE Take 200 mg by mouth at bedtime.   levothyroxine 50 MCG tablet Commonly known as: SYNTHROID Take 50 mcg by mouth daily before breakfast.   nystatin powder Commonly known as: MYCOSTATIN/NYSTOP Apply 1 Application topically 2 (two) times daily. Apply to (L) groin area topically two times a day related to RASH AND OTHER NONSPECIFIC SKIN ERUPTION   omega-3 acid ethyl esters 1 g capsule Commonly known as: LOVAZA Take 1 g by mouth 2 (two) times daily.   PEG 3350 17 GM/SCOOP Powd Give 17gm one time daily.   potassium chloride 10 MEQ CR capsule Commonly known as: MICRO-K Take 10 mEq by mouth daily.   PRESERVISION AREDS 2 PO Take by mouth daily.   Therems-M Tabs Take 1 tablet by mouth daily.   saccharomyces boulardii 250 MG capsule Commonly known as: FLORASTOR Take 250 mg by mouth daily.   Systane 0.4-0.3 % Soln Generic drug: Polyethyl Glycol-Propyl Glycol Place 2 drops into the left eye at bedtime.   torsemide 20 MG tablet Commonly known as: DEMADEX Take 20 mg by mouth daily.   UNABLE TO FIND Med Name: Magic Cup two times a day   vitamin C 1000 MG tablet Take 1,000 mg by mouth in the morning and at bedtime.   zolpidem 5 MG tablet Commonly known as: AMBIEN Take 1 tablet (5 mg total) by mouth at bedtime. TAKE ONE TABLET AT BEDTIME        Review of Systems  Constitutional:  Negative for appetite change, fatigue and fever.  HENT:  Positive for hearing loss. Negative for congestion and trouble swallowing.   Respiratory:  Positive for shortness of breath. Negative for cough and wheezing.        Occasional nocturnal orthopnea at his baseline.   Cardiovascular:  Positive for leg swelling. Negative for chest pain and palpitations.  Gastrointestinal:  Negative for abdominal pain and constipation.  Genitourinary:  Positive for frequency. Negative for difficulty urinating and urgency.  Musculoskeletal:  Positive for  arthralgias and gait problem.  Skin:  Negative for color change.  Neurological:  Negative for speech difficulty,  weakness and headaches.  Psychiatric/Behavioral:  Negative for confusion and sleep disturbance. The patient is not nervous/anxious.     Immunization History  Administered Date(s) Administered   Fluad Quad(high Dose 65+) 03/16/2023   Influenza, High Dose Seasonal PF 04/11/2017, 03/26/2020   Influenza-Unspecified 03/26/2020, 04/01/2021, 04/12/2022   Moderna SARS-COV2 Booster Vaccination 04/06/2020   Moderna Sars-Covid-2 Vaccination 06/18/2019, 07/16/2019, 10/30/2021   PFIZER(Purple Top)SARS-COV-2 Vaccination 09/14/2020, 03/03/2021   Pfizer Covid-19 Vaccine Bivalent Booster 34yrs & up 04/15/2022   Pneumococcal-Unspecified 08/12/2012   Tetanus 06/14/2004   Zoster Recombinant(Shingrix) 02/03/2017, 06/02/2017   Pertinent  Health Maintenance Due  Topic Date Due   INFLUENZA VACCINE  Completed      06/22/2022   11:47 AM 07/20/2022   10:38 AM 09/07/2022   10:32 AM 10/18/2022   10:21 AM 12/17/2022    2:32 PM  Fall Risk  Falls in the past year? 0 0 1 1 1   Was there an injury with Fall? 0 0 0 0 1  Fall Risk Category Calculator 0 0 2 2 3   Fall Risk Category (Retired) Low      (RETIRED) Patient Fall Risk Level Low fall risk      Patient at Risk for Falls Due to History of fall(s) History of fall(s) History of fall(s);Impaired balance/gait;Impaired mobility History of fall(s);Impaired balance/gait History of fall(s);Impaired balance/gait;Impaired mobility  Fall risk Follow up Falls evaluation completed Falls evaluation completed Falls evaluation completed Falls evaluation completed Falls evaluation completed   Functional Status Survey:    Vitals:   07/26/23 1532  BP: 131/66  Pulse: 72  Resp: 18  Temp: (!) 97.4 F (36.3 C)  SpO2: 96%  Weight: 143 lb 12.8 oz (65.2 kg)   Body mass index is 24.68 kg/m. Physical Exam Vitals and nursing note reviewed.  Constitutional:       Appearance: Normal appearance.  HENT:     Head: Normocephalic and atraumatic.     Mouth/Throat:     Mouth: Mucous membranes are moist.  Eyes:     Extraocular Movements: Extraocular movements intact.     Conjunctiva/sclera: Conjunctivae normal.     Pupils: Pupils are equal, round, and reactive to light.     Comments: R+L lower eyelid ectropion, mild crusted eyelashes.   Cardiovascular:     Rate and Rhythm: Bradycardia present. Rhythm irregular.     Heart sounds: Murmur heard.  Pulmonary:     Effort: Pulmonary effort is normal.     Breath sounds: Rales present.     Comments: Bibasilar rales.  Abdominal:     General: Bowel sounds are normal.     Palpations: Abdomen is soft.     Tenderness: There is no abdominal tenderness.  Musculoskeletal:     Cervical back: Normal range of motion and neck supple.     Right lower leg: Edema present.     Left lower leg: Edema present.     Comments: Minimal edema BLE  Skin:    General: Skin is warm and dry.     Comments: The right lower shin open area 2x3cm healing, no s/s of infection  Neurological:     General: No focal deficit present.     Mental Status: He is alert and oriented to person, place, and time. Mental status is at baseline.     Motor: Weakness present.     Coordination: Coordination normal.     Gait: Gait abnormal.     Comments: Left sided weakness form previous CVA, muscle strength 5/5  Psychiatric:  Mood and Affect: Mood normal.        Behavior: Behavior normal.        Thought Content: Thought content normal.        Judgment: Judgment normal.     Labs reviewed: Recent Labs    03/22/23 0000 05/31/23 0000 06/07/23 0000  NA 141 142 140  K 3.8 4.0 4.2  CL 99 104 101  CO2 31* 29* 31*  BUN 30* 30* 30*  CREATININE 1.6* 1.7* 1.9*  CALCIUM 9.0 8.8 9.2   Recent Labs    09/07/22 0000 12/03/22 0000  AST 17 18  ALT 13 11  ALKPHOS  --  65  ALBUMIN 3.4* 3.3*   Recent Labs    09/07/22 0000 12/02/22 0000  WBC  6.0 8.0  NEUTROABS 3,360.00 6,280.00  HGB 13.8 14.2  HCT 41 42  PLT 189 212   Lab Results  Component Value Date   TSH 2.58 03/08/2023   No results found for: "HGBA1C" Lab Results  Component Value Date   CHOL 183 05/05/2020   HDL 54 05/05/2020   LDLCALC 107 (H) 05/05/2020   TRIG 128 05/05/2020   CHOLHDL 3.4 05/05/2020    Significant Diagnostic Results in last 30 days:  No results found.  Assessment/Plan  Pulmonary hypertension, primary (HCC) Blood pressure is controlled Pulmonary hypertension/edema BLE minimal, reduce Torsemide every other day [place] 6/18/21negative DVT, last echo 2018 EF 55-60%, CXR 12/02/22 no acute findings.   Insomnia  takes Zolpidem  Hypothyroidism takes Levothyroxine, TSH 2.58 03/08/23  CKD (chronic kidney disease) stage 3, GFR 30-59 ml/min (HCC) Bun/creat 30/1.9 06/07/23  Slow transit constipation Stable, takes Colace.   CVA (cerebral vascular accident) (HCC) slightly left sided weakness with muscle strength 5/5  CAD (coronary artery disease) No chest pain reported, on ASA, Coronary artery bypass grafting 1992  Paroxysmal atrial fibrillation At his baseline tachy brady, refused PPM. EKG 12/05/19 Afib at cardiology, only takes ASA   Family/ staff Communication: plan of care reviewed with the patient and charge nurse.   Labs/tests ordered:  none

## 2023-07-26 NOTE — Assessment & Plan Note (Signed)
takes Zolpidem.  ?

## 2023-07-26 NOTE — Assessment & Plan Note (Signed)
slightly left sided weakness with muscle strength 5/5

## 2023-07-26 NOTE — Assessment & Plan Note (Signed)
Bun/creat 30/1.9 06/07/23

## 2023-07-26 NOTE — Assessment & Plan Note (Signed)
Stable, takes Colace.

## 2023-09-02 ENCOUNTER — Non-Acute Institutional Stay (SKILLED_NURSING_FACILITY): Payer: Self-pay | Admitting: Sports Medicine

## 2023-09-02 DIAGNOSIS — I270 Primary pulmonary hypertension: Secondary | ICD-10-CM

## 2023-09-02 DIAGNOSIS — E039 Hypothyroidism, unspecified: Secondary | ICD-10-CM

## 2023-09-02 DIAGNOSIS — F0390 Unspecified dementia without behavioral disturbance: Secondary | ICD-10-CM

## 2023-09-02 DIAGNOSIS — M159 Polyosteoarthritis, unspecified: Secondary | ICD-10-CM

## 2023-09-02 NOTE — Progress Notes (Signed)
 Provider:  Dr. Venita Sheffield Location:  [facility] Place of Service:   [facility]    PCP: Mast, Man X, NP Patient Care Team: Mast, Man X, NP as PCP - General (Internal Medicine) Lyn Records, MD (Inactive) as PCP - Cardiology (Cardiology)  Extended Emergency Contact Information Primary Emergency Contact: Laurie Panda Address: [address]          [place], [place] Home Phone: [phone] Relation: Spouse Secondary Emergency Contact: Lorretta Harp Address: [address]          [place], [place] [address] [place] Home Phone: [phone] Relation: Daughter  Goals of Care: Advanced Directive information    07/12/2023    3:38 PM  Advanced Directives  Does Patient Have a Medical Advance Directive? Yes  Type of Advance Directive Living will;Out of facility DNR (pink MOST or yellow form);Healthcare Power of Attorney  Does patient want to make changes to medical advance directive? No - Patient declined  Copy of Healthcare Power of Attorney in Chart? Yes - validated most recent copy scanned in chart (See row information)  Pre-existing out of facility DNR order (yellow form or pink MOST form) Pink MOST form placed in chart (order not valid for inpatient use);Yellow form placed in chart (order not valid for inpatient use)       History of Present Illness       89 year old Barnes with a past medical history of A-fib, coronary artery disease, history of CVA, hypertension, hypothyroidism, CKD, hyperlipidemia is evaluated for chronic disease management.  Patient seen and examined in his room.  He is laying on the bed sleeping.  He opened his eyes on calling his name.  He answers questions mostly 1-2 word sentences. Patient seems pleasant and does not appear to be in distress.  He knows his name, not oriented to time or place. He cannot remember what he had for breakfast this morning.  He is nonambulatory Staff reports that  he is requiring help with all his ADLs. Patient has history of chronic lower extremity swelling but his swelling has improved. Patient denies chest pain, palpitations, dizzy, lightheadedness, abdominal pain, nausea, vomiting, dysuria, hematuria.      Past Medical History:  Diagnosis Date   Atrial fibrillation (HCC)    CAD (coronary artery disease)    Diverticulosis    Hypercholesteremia    Hypertension    Hypothyroidism    Insomnia    Murmur    Paroxysmal atrial fibrillation (HCC)    Urinary dysfunction    Past Surgical History:  Procedure Laterality Date   BYPASS GRAFT     HEMORRHOID SURGERY     HERNIA REPAIR     HIP SURGERY      reports that he has quit smoking. He has never used smokeless tobacco. He reports current alcohol use. He reports that he does not use drugs. Social History   Socioeconomic History   Marital status: Married    Spouse name: Not on file   Number of children: Not on file   Years of education: Not on file   Highest education level: Not on file  Occupational History   Not on file  Tobacco Use   Smoking status: Former   Smokeless tobacco: Never  Vaping Use   Vaping status: Never Used  Substance and Sexual Activity   Alcohol use: Yes    Comment: occasional   Drug use: No   Sexual activity: Not Currently  Other Topics  Concern   Not on file  Social History Narrative   Social History     Tobacco Use       Smoking status: None       Smokeless tobacco: Never Used     Alcohol use: No       Alcohol/week: 0.0 standard drinks     Drug use: No   Diet is good   No caffeine   Married since 1952   Lives in apartment, one story   Lives with full time home health   Pet- Cat (Tai)   Highest level of education: PHD   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   No difficulty  affording medications   Social Drivers of Corporate investment banker Strain: Not on file  Food Insecurity: Not on file  Transportation Needs: Not on file  Physical Activity: Not on file  Stress: Not on file  Social Connections: Not on file  Intimate Partner Violence: Not on file    Functional Status Survey:    Family History  Problem Relation Age of Onset   Heart disease Mother    Cancer Father    ALS Brother    Heart disease Daughter     Health Maintenance  Topic Date Due   COVID-19 Vaccine (8 - 2024-25 season) 02/13/2023   Medicare Annual Wellness (AWV)  07/11/2024   Pneumonia Vaccine 25+ Years old  Completed   INFLUENZA VACCINE  Completed   Zoster Vaccines- Shingrix  Completed   HPV VACCINES  Aged Out   DTaP/Tdap/Td  Discontinued    Allergies  Allergen Reactions   Cranberry Extract Cough   Shellfish Allergy Swelling and Rash    Outpatient Encounter Medications as of 09/02/2023  Medication Sig   acetaminophen (TYLENOL) 325 MG tablet Take 650 mg by mouth every 6 (six) hours as needed for mild pain (pain score 1-3) or fever (NOT TO EXCEED 3, 000 MG IN 24 HOURS).   acetaminophen (TYLENOL) 650 MG suppository Place 650 mg rectally every 4 (four) hours as needed.   Ascorbic Acid (VITAMIN C) 1000 MG tablet Take 1,000 mg by mouth in the morning and at bedtime. (Patient not taking: Reported on 07/12/2023)   aspirin 81 MG EC tablet in the morning and at bedtime.    atropine 1 % ophthalmic solution Place 2 drops under the tongue every 2 (two) hours as needed.   cholecalciferol (VITAMIN D) 1000 UNITS tablet Take 1,000 Units by mouth daily. (Patient not taking: Reported on 07/12/2023)   docusate sodium (COLACE) 100 MG capsule Take 200 mg by mouth at bedtime.   levothyroxine (SYNTHROID) 50 MCG tablet Take 50 mcg by mouth daily before breakfast.   Multiple Vitamins-Minerals (PRESERVISION AREDS 2 PO) Take by mouth daily. (Patient not taking: Reported on 07/12/2023)   Multiple  Vitamins-Minerals (THEREMS-M) TABS Take 1 tablet by mouth daily. (Patient not taking: Reported on 07/12/2023)   nystatin (MYCOSTATIN/NYSTOP) powder Apply 1 Application topically 2 (two) times daily. Apply to (L) groin area topically two times a day related to RASH AND OTHER NONSPECIFIC SKIN ERUPTION   omega-3 acid ethyl esters (LOVAZA) 1 g capsule Take 1 g by mouth 2 (two) times daily. (Patient not taking: Reported on 07/12/2023)   Polyethyl Glycol-Propyl Glycol (SYSTANE) 0.4-0.3 % SOLN Place 2 drops into the left  eye at bedtime.   Polyethylene Glycol 3350 (PEG 3350) 17 GM/SCOOP POWD Give 17gm one time daily. (Patient not taking: Reported on 07/12/2023)   potassium chloride (MICRO-K) 10 MEQ CR capsule Take 10 mEq by mouth daily.   saccharomyces boulardii (FLORASTOR) 250 MG capsule Take 250 mg by mouth daily. (Patient not taking: Reported on 07/12/2023)   torsemide (DEMADEX) 20 MG tablet Take 20 mg by mouth daily.   UNABLE TO FIND Med Name: Magic Cup two times a day   zolpidem (AMBIEN) 5 MG tablet Take 1 tablet (5 mg total) by mouth at bedtime. TAKE ONE TABLET AT BEDTIME   No facility-administered encounter medications on file as of 09/02/2023.    Review of Systems  Constitutional:  Negative for chills and fever.  HENT:  Negative for sore throat.   Respiratory:  Negative for cough, shortness of breath and wheezing.   Cardiovascular:  Negative for chest pain, palpitations and leg swelling.  Gastrointestinal:  Negative for abdominal distention, abdominal pain, blood in stool, constipation, diarrhea, nausea and vomiting.  Genitourinary:  Negative for dysuria.  Neurological:  Negative for dizziness.   Negative unless indicated in HPI.  There were no vitals filed for this visit. There is no height or weight on file to calculate BMI. BP Readings from Last 3 Encounters:  07/26/23 131/66  07/12/23 136/73  07/11/23 114/65   Wt Readings from Last 3 Encounters:  07/26/23 143 lb 12.8 oz (65.2 kg)   07/12/23 154 lb (69.9 kg)  07/11/23 154 lb (69.9 kg)   Physical Exam Constitutional:      Appearance: Normal appearance.  HENT:     Head: Normocephalic and atraumatic.  Cardiovascular:     Rate and Rhythm: Normal rate and regular rhythm.     Pulses: Normal pulses.     Heart sounds: Normal heart sounds.  Pulmonary:     Effort: No respiratory distress.     Breath sounds: No stridor. No wheezing or rales.  Abdominal:     General: Bowel sounds are normal. There is no distension.     Palpations: Abdomen is soft.     Tenderness: There is no abdominal tenderness. There is no guarding.  Musculoskeletal:        General: No swelling.  Neurological:     Mental Status: He is alert. Mental status is at baseline.     Labs reviewed: Basic Metabolic Panel: Recent Labs    03/22/23 0000 05/31/23 0000 06/07/23 0000  NA 141 142 140  K 3.8 4.0 4.2  CL 99 104 101  CO2 31* 29* 31*  BUN 30* 30* 30*  CREATININE 1.6* 1.7* 1.9*  CALCIUM 9.0 8.8 9.2   Liver Function Tests: Recent Labs    09/07/22 0000 12/03/22 0000  AST 17 18  ALT 13 11  ALKPHOS  --  65  ALBUMIN 3.4* 3.3*   No results for input(s): "LIPASE", "AMYLASE" in the last 8760 hours. No results for input(s): "AMMONIA" in the last 8760 hours. CBC: Recent Labs    09/07/22 0000 12/02/22 0000  WBC 6.0 8.0  NEUTROABS 3,360.00 6,280.00  HGB 13.8 14.2  HCT 41 42  PLT 189 212   Cardiac Enzymes: No results for input(s): "CKTOTAL", "CKMB", "CKMBINDEX", "TROPONINI" in the last 8760 hours. BNP: Invalid input(s): "POCBNP" No results found for: "HGBA1C" Lab Results  Component Value Date   TSH 2.58 03/08/2023   No results found for: "VITAMINB12" No results found for: "FOLATE" No results found for: "IRON", "TIBC", "FERRITIN"  Imaging and Procedures obtained prior to SNF admission: No results found.  Assessment and Plan        Major neurocognitive disorder Continue with supportive care No behavioral problems as per  nursing staff  Pulmonary hypertension Chronic lower extremity swelling Swelling improved Continue with torsemide, potassium supplements Elevated feet, use compression stockings Lower salty foods  Hypothyroidism Continue with levothyroxine  Insomnia Continue with zolpidem  Osteoarthritis Patient denied pain Continue Tylenol as needed     30 min Total time spent for obtaining history,  performing a medically appropriate examination and evaluation, reviewing the tests,   documenting clinical information in the electronic or other health record,  ,care coordination (not separately reported)

## 2023-09-05 ENCOUNTER — Encounter: Payer: Self-pay | Admitting: Sports Medicine

## 2023-09-26 ENCOUNTER — Non-Acute Institutional Stay (SKILLED_NURSING_FACILITY): Payer: Self-pay | Admitting: Nurse Practitioner

## 2023-09-26 ENCOUNTER — Encounter: Payer: Self-pay | Admitting: Nurse Practitioner

## 2023-09-26 DIAGNOSIS — R627 Adult failure to thrive: Secondary | ICD-10-CM

## 2023-09-26 DIAGNOSIS — I480 Paroxysmal atrial fibrillation: Secondary | ICD-10-CM

## 2023-09-26 DIAGNOSIS — I10 Essential (primary) hypertension: Secondary | ICD-10-CM | POA: Diagnosis not present

## 2023-09-26 NOTE — Assessment & Plan Note (Signed)
 low Bp,  off Torsemide.

## 2023-09-26 NOTE — Assessment & Plan Note (Addendum)
 HPOA and patient desires comfort measures, agree to schedule Morphine and Ativan for pain, SOB, and rest.

## 2023-09-26 NOTE — Assessment & Plan Note (Signed)
 tachy brady, refused PPM. Off ASA

## 2023-09-26 NOTE — Progress Notes (Signed)
 Location:   [facility] Room Number: 66 Place of Service:  SNF (31) Provider: Abner Hoffman Annya Lizana NP  Atiba Kimberlin X, NP  Patient Care Team: Marchell Froman X, NP as PCP - General (Internal Medicine) Arty Binning, MD (Inactive) as PCP - Cardiology (Cardiology)  Extended Emergency Contact Information Primary Emergency Contact: Brugger,Jean Address: [address]          [place], [place] Home Phone: [phone] Relation: Spouse Secondary Emergency Contact: Zell Hicks Address: [address]          [place], [place] [address] [place] Home Phone: [phone] Relation: Daughter  Code Status:  DNR Goals of care: Advanced Directive information    07/12/2023    3:38 PM  Advanced Directives  Does Patient Have a Medical Advance Directive? Yes  Type of Advance Directive Living will;Out of facility DNR (pink MOST or yellow form);Healthcare Power of Attorney  Does patient want to make changes to medical advance directive? No - Patient declined  Copy of Healthcare Power of Attorney in Chart? Yes - validated most recent copy scanned in chart (See row information)  Pre-existing out of facility DNR order (yellow form or pink MOST form) Pink MOST form placed in chart (order not valid for inpatient use);Yellow form placed in chart (order not valid for inpatient use)     Chief Complaint  Patient presents with   Medical Management of Chronic Issues    HPI:  Pt is a 89 y.o. male seen today for medical management of chronic diseases.     Adult failure to thrive,  HPOA and patient desires comfort measures, agree to schedule Morphine and Ativan for pain, SOB, and rest.              Afib, tachy brady, refused PPM. Off ASA             CAD, on ASA, Coronary artery bypass grafting 1992             Hx of GI bleed, Hgb 14.2 12/02/22             CVA, slightly left sided weakness with muscle strength 5/5             HTN, low Bp,  off Torsemide.  Pulmonary  hypertension/edema BLE-not apparent, off  Torsemide Insomnia, off Zolpidem             Hypothyroidism, off  Levothyroxine, TSH 2.58 03/08/23                                                Past Medical History:  Diagnosis Date   Atrial fibrillation (HCC)    CAD (coronary artery disease)    Diverticulosis    Hypercholesteremia    Hypertension    Hypothyroidism    Insomnia    Murmur    Paroxysmal atrial fibrillation (HCC)    Urinary dysfunction    Past Surgical History:  Procedure Laterality Date   BYPASS GRAFT     HEMORRHOID SURGERY     HERNIA REPAIR     HIP SURGERY      Allergies  Allergen Reactions   Cranberry Extract Cough   Shellfish Allergy Swelling and Rash    Allergies as of 09/26/2023       Reactions   Cranberry Extract Cough   Shellfish  Allergy Swelling, Rash        Medication List        Accurate as of September 26, 2023  1:55 PM. If you have any questions, ask your nurse or doctor.          acetaminophen 650 MG suppository Commonly known as: TYLENOL Place 650 mg rectally every 4 (four) hours as needed.   acetaminophen 325 MG tablet Commonly known as: TYLENOL Take 650 mg by mouth every 6 (six) hours as needed for mild pain (pain score 1-3) or fever (NOT TO EXCEED 3, 000 MG IN 24 HOURS).   aspirin EC 81 MG tablet in the morning and at bedtime.   atropine 1 % ophthalmic solution Place 2 drops under the tongue every 2 (two) hours as needed.   cholecalciferol 1000 units tablet Commonly known as: VITAMIN D Take 1,000 Units by mouth daily.   docusate sodium 100 MG capsule Commonly known as: COLACE Take 200 mg by mouth at bedtime.   levothyroxine 50 MCG tablet Commonly known as: SYNTHROID Take 50 mcg by mouth daily before breakfast.   nystatin powder Commonly known as: MYCOSTATIN/NYSTOP Apply 1 Application topically 2 (two) times daily. Apply to (L) groin area topically two times a day related to RASH AND OTHER NONSPECIFIC SKIN ERUPTION    omega-3 acid ethyl esters 1 g capsule Commonly known as: LOVAZA Take 1 g by mouth 2 (two) times daily.   PEG 3350 17 GM/SCOOP Powd Give 17gm one time daily.   potassium chloride 10 MEQ CR capsule Commonly known as: MICRO-K Take 10 mEq by mouth daily.   potassium chloride 10 MEQ tablet Commonly known as: KLOR-CON M Take 10 mEq by mouth daily.   PRESERVISION AREDS 2 PO Take by mouth daily.   Therems-M Tabs Take 1 tablet by mouth daily.   saccharomyces boulardii 250 MG capsule Commonly known as: FLORASTOR Take 250 mg by mouth daily.   Systane 0.4-0.3 % Soln Generic drug: Polyethyl Glycol-Propyl Glycol Place 2 drops into the left eye at bedtime.   torsemide 20 MG tablet Commonly known as: DEMADEX Take 20 mg by mouth daily.   UNABLE TO FIND Med Name: Magic Cup two times a day   vitamin C 1000 MG tablet Take 1,000 mg by mouth in the morning and at bedtime.   zolpidem 5 MG tablet Commonly known as: AMBIEN Take 1 tablet (5 mg total) by mouth at bedtime. TAKE ONE TABLET AT BEDTIME        Review of Systems  Constitutional:  Positive for appetite change and fatigue. Negative for fever.  HENT:  Positive for hearing loss. Negative for congestion and trouble swallowing.   Respiratory:  Positive for shortness of breath. Negative for cough.        Occasional nocturnal orthopnea at his baseline.   Cardiovascular:  Negative for chest pain, palpitations and leg swelling.  Gastrointestinal:  Negative for abdominal pain and constipation.  Genitourinary:  Positive for frequency. Negative for difficulty urinating and urgency.  Musculoskeletal:  Positive for arthralgias.  Skin:  Negative for color change.  Neurological:  Negative for speech difficulty, weakness and headaches.  Psychiatric/Behavioral:  Positive for sleep disturbance. Negative for confusion. The patient is nervous/anxious.     Immunization History  Administered Date(s) Administered   Fluad Quad(high Dose 65+)  03/16/2023   Influenza, High Dose Seasonal PF 04/11/2017, 03/26/2020   Influenza-Unspecified 03/26/2020, 04/01/2021, 04/12/2022   Moderna SARS-COV2 Booster Vaccination 04/06/2020   Moderna Sars-Covid-2 Vaccination 06/18/2019, 07/16/2019, 10/30/2021  PFIZER(Purple Top)SARS-COV-2 Vaccination 09/14/2020, 03/03/2021   Pfizer Covid-19 Vaccine Bivalent Booster 40yrs & up 04/15/2022   Pneumococcal-Unspecified 08/12/2012   Tetanus 06/14/2004   Zoster Recombinant(Shingrix) 02/03/2017, 06/02/2017   Pertinent  Health Maintenance Due  Topic Date Due   INFLUENZA VACCINE  01/13/2024      06/22/2022   11:47 AM 07/20/2022   10:38 AM 09/07/2022   10:32 AM 10/18/2022   10:21 AM 12/17/2022    2:32 PM  Fall Risk  Falls in the past year? 0 0 1 1 1   Was there an injury with Fall? 0 0 0 0 1  Fall Risk Category Calculator 0 0 2 2 3   Fall Risk Category (Retired) Low      (RETIRED) Patient Fall Risk Level Low fall risk      Patient at Risk for Falls Due to History of fall(s) History of fall(s) History of fall(s);Impaired balance/gait;Impaired mobility History of fall(s);Impaired balance/gait History of fall(s);Impaired balance/gait;Impaired mobility  Fall risk Follow up Falls evaluation completed Falls evaluation completed Falls evaluation completed Falls evaluation completed Falls evaluation completed   Functional Status Survey:    Vitals:   09/26/23 1341  BP: (!) 90/51  Pulse: 68  Resp: 18  Temp: (!) 97.1 F (36.2 C)  SpO2: 95%  Weight: 129 lb 9.6 oz (58.8 kg)   Body mass index is 22.25 kg/m. Physical Exam Vitals and nursing note reviewed.  Constitutional:      Comments: Generalized weakness, bedrest only  HENT:     Head: Normocephalic and atraumatic.     Mouth/Throat:     Mouth: Mucous membranes are dry.  Eyes:     Extraocular Movements: Extraocular movements intact.     Conjunctiva/sclera: Conjunctivae normal.     Pupils: Pupils are equal, round, and reactive to light.     Comments: R+L  lower eyelid ectropion, mild crusted eyelashes.   Cardiovascular:     Rate and Rhythm: Bradycardia present. Rhythm irregular.     Heart sounds: Murmur heard.  Pulmonary:     Effort: Pulmonary effort is normal.     Breath sounds: No rales.     Comments: O2 2lpm via [place] Abdominal:     General: Bowel sounds are normal.     Palpations: Abdomen is soft.     Tenderness: There is no abdominal tenderness.  Musculoskeletal:     Cervical back: Normal range of motion and neck supple.     Right lower leg: No edema.     Left lower leg: No edema.  Skin:    General: Skin is warm and dry.     Comments: The right lower shin open area 2x3cm healing, no s/s of infection  Neurological:     General: No focal deficit present.     Mental Status: He is alert. Mental status is at baseline.     Motor: Weakness present.     Coordination: Coordination normal.     Comments: Left sided weakness form previous CVA, muscle strength 5/5 Bedrest Oriented to person, his room  Psychiatric:        Mood and Affect: Mood normal.        Behavior: Behavior normal.     Labs reviewed: Recent Labs    03/22/23 0000 05/31/23 0000 06/07/23 0000  NA 141 142 140  K 3.8 4.0 4.2  CL 99 104 101  CO2 31* 29* 31*  BUN 30* 30* 30*  CREATININE 1.6* 1.7* 1.9*  CALCIUM 9.0 8.8 9.2   Recent Labs  12/03/22 0000  AST 18  ALT 11  ALKPHOS 65  ALBUMIN 3.3*   Recent Labs    12/02/22 0000  WBC 8.0  NEUTROABS 6,280.00  HGB 14.2  HCT 42  PLT 212   Lab Results  Component Value Date   TSH 2.58 03/08/2023   No results found for: "HGBA1C" Lab Results  Component Value Date   CHOL 183 05/05/2020   HDL 54 05/05/2020   LDLCALC 107 (H) 05/05/2020   TRIG 128 05/05/2020   CHOLHDL 3.4 05/05/2020    Significant Diagnostic Results in last 30 days:  No results found.  Assessment/Plan  Adult failure to thrive HPOA and patient desires comfort measures, agree to schedule Morphine and Ativan for pain, SOB, and rest.    Paroxysmal atrial fibrillation tachy brady, refused PPM. Off ASA  Hypertension  low Bp,  off Torsemide.    Family/ staff Communication: plan of care reviewed with the patient, the patient's HPOA daughter, and charge nruse.   Labs/tests ordered:  none

## 2023-10-13 DEATH — deceased
